# Patient Record
Sex: Male | Born: 1935 | Race: White | Hispanic: No | Marital: Married | Smoking: Former smoker
Health system: Southern US, Community
[De-identification: ages and names within clinical notes are randomized; demographics above are authoritative.]

## PROBLEM LIST (undated history)

## (undated) DIAGNOSIS — C649 Malignant neoplasm of unspecified kidney, except renal pelvis: Secondary | ICD-10-CM

## (undated) DIAGNOSIS — I10 Essential (primary) hypertension: Secondary | ICD-10-CM

## (undated) DIAGNOSIS — N186 End stage renal disease: Secondary | ICD-10-CM

## (undated) DIAGNOSIS — S82899A Other fracture of unspecified lower leg, initial encounter for closed fracture: Secondary | ICD-10-CM

## (undated) DIAGNOSIS — M1990 Unspecified osteoarthritis, unspecified site: Secondary | ICD-10-CM

## (undated) DIAGNOSIS — F329 Major depressive disorder, single episode, unspecified: Secondary | ICD-10-CM

## (undated) DIAGNOSIS — I340 Nonrheumatic mitral (valve) insufficiency: Secondary | ICD-10-CM

## (undated) DIAGNOSIS — I38 Endocarditis, valve unspecified: Secondary | ICD-10-CM

## (undated) DIAGNOSIS — G629 Polyneuropathy, unspecified: Secondary | ICD-10-CM

## (undated) DIAGNOSIS — F32A Depression, unspecified: Secondary | ICD-10-CM

## (undated) DIAGNOSIS — C439 Malignant melanoma of skin, unspecified: Secondary | ICD-10-CM

## (undated) DIAGNOSIS — E119 Type 2 diabetes mellitus without complications: Secondary | ICD-10-CM

## (undated) DIAGNOSIS — R51 Headache: Secondary | ICD-10-CM

## (undated) DIAGNOSIS — R21 Rash and other nonspecific skin eruption: Secondary | ICD-10-CM

## (undated) DIAGNOSIS — Z992 Dependence on renal dialysis: Secondary | ICD-10-CM

## (undated) HISTORY — DX: Nonrheumatic mitral (valve) insufficiency: I34.0

## (undated) HISTORY — DX: Malignant melanoma of skin, unspecified: C43.9

## (undated) HISTORY — DX: Essential (primary) hypertension: I10

## (undated) HISTORY — PX: HERNIA REPAIR: SHX51

## (undated) HISTORY — PX: COLONOSCOPY W/ BIOPSIES AND POLYPECTOMY: SHX1376

## (undated) HISTORY — DX: Polyneuropathy, unspecified: G62.9

## (undated) HISTORY — PX: FRACTURE SURGERY: SHX138

---

## 2009-03-26 ENCOUNTER — Ambulatory Visit: Payer: Self-pay | Admitting: Vascular Surgery

## 2009-04-08 ENCOUNTER — Ambulatory Visit (HOSPITAL_COMMUNITY): Admission: RE | Admit: 2009-04-08 | Discharge: 2009-04-08 | Payer: Self-pay | Admitting: Vascular Surgery

## 2009-04-08 ENCOUNTER — Ambulatory Visit: Payer: Self-pay | Admitting: Vascular Surgery

## 2009-05-14 ENCOUNTER — Ambulatory Visit: Payer: Self-pay | Admitting: Vascular Surgery

## 2010-10-06 LAB — GLUCOSE, CAPILLARY: Glucose-Capillary: 86 mg/dL (ref 70–99)

## 2010-10-06 LAB — POCT I-STAT 4, (NA,K, GLUC, HGB,HCT)
Glucose, Bld: 99 mg/dL (ref 70–99)
Potassium: 4 mEq/L (ref 3.5–5.1)

## 2010-11-15 NOTE — Procedures (Signed)
CEPHALIC VEIN MAPPING   INDICATION:  Preop evaluation.   HISTORY:  Chronic kidney disease.   EXAM:  The right cephalic vein is compressible with diameter measurements  ranging from 0.19 to 0.9 cm.   The left cephalic vein is compressible with diameter measurements  ranging from 0.26 to 0.53 cm.   See attached worksheet for all measurements.   IMPRESSION:  Patent bilateral cephalic veins with diameter measurements  as described above and on the attached worksheet.   ___________________________________________  Larina Earthly, M.D.   CH/MEDQ  D:  03/26/2009  T:  03/26/2009  Job:  [id number]

## 2010-11-15 NOTE — Consult Note (Signed)
NEW PATIENT CONSULTATION   [name], [name]  DOB:  09-21-1934                                       03/26/2009  CHART#:[id number]   The patient presents today for evaluation of AV access placement.  He is  a very pleasant 76 year old gentleman who had right nephrectomy for  renal cell cancer greater than 20 years ago.  He has had progressive  renal insufficiency over the past several years and is now approaching  the need for hemodialysis.  We are seeing him today for access  discussion.  He does have a history of non-insulin-dependent diabetes  for 4 years.  He is hypertensive.  He does not have a premature his 3 or  4 atherosclerotic disease and does not have any cardiac disease.   FAMILY HISTORY:  Negative for premature atherosclerotic disease.   SOCIAL HISTORY:  Is married with 2 children.  Quit smoking approximately  40 years ago.  Does not drink alcohol on a regular basis.  He is  retired.   REVIEW OF SYSTEMS:  Positive for weight loss.  Loss of appetite.  Chest  pain and shortness of breath with exertion.  Hiatal hernia.  Reflux.  Urinary frequency.  Chronic leg pain.  Headache.  Arthritic and muscle  pain.   ALLERGIES:  Sulfa causes a rash.  OxyContin causes light-headedness.   CURRENT MEDICATIONS:  Listed in his chart.   PHYSICAL EXAM:  Well-developed, well-nourished white male appearing  stated age of 78.  Blood pressure is 135/69, pulse 51, respirations 18.  His radial pulses are 2+ bilaterally.  He does have well-developed  cephalic vein in his wrists bilaterally.   He underwent vein mapping and this revealed the patency of the cephalic  veins bilaterally.  It is a good caliber of this at the level of his  wrist on the left, somewhat better than on the right.  He is right  handed.  I had a long discussion with the patient and I explained the  options of AV fistula, AV graft, and dialysis catheter for access.  I  recommended that we proceed with  AV fistula placement to his left wrist.  I explained that the longer this works the more mature the fistula would  be and would not accelerate the need for hemodialysis access.  We have  scheduled this at his convenience 10/07 as an outpatient at [facility].   Larina Earthly, M.D.  Electronically Signed   TFE/MEDQ  D:  03/26/2009  T:  03/26/2009  Job:  [id number]   cc:   Jorja Loa, M.D.  Wyvonnia Lora

## 2010-11-15 NOTE — Assessment & Plan Note (Signed)
OFFICE VISIT   [name] [name], [name] [name]  DOB:  [birth year]                                       05/14/2009  CHART#:[id number]   Patient presents today for follow-up of his left AV fistula creation on  04/08/09.  This was a left Cimino fistula.   He has excellent early maturation with very good size and very good flow  through his cephalic vein fistula.  He will continue his exercise of  this, and we will see him again on a p.r.n. basis   Larina Earthly, M.D.  Electronically Signed   TFE/MEDQ  D:  05/14/2009  T:  05/17/2009  Job:  [id number]   cc:   Jorja Loa, M.D.

## 2011-12-01 ENCOUNTER — Inpatient Hospital Stay (HOSPITAL_COMMUNITY)
Admission: RE | Admit: 2011-12-01 | Discharge: 2011-12-09 | DRG: 492 | Disposition: A | Discharge status: Home Health | Payer: Medicare PPO | Source: Other Acute Inpatient Hospital | Attending: Internal Medicine | Admitting: Internal Medicine

## 2011-12-01 ENCOUNTER — Encounter (HOSPITAL_COMMUNITY): Payer: Self-pay | Admitting: Orthopedic Surgery

## 2011-12-01 DIAGNOSIS — Z85528 Personal history of other malignant neoplasm of kidney: Secondary | ICD-10-CM

## 2011-12-01 DIAGNOSIS — M249 Joint derangement, unspecified: Secondary | ICD-10-CM | POA: Diagnosis present

## 2011-12-01 DIAGNOSIS — R1310 Dysphagia, unspecified: Secondary | ICD-10-CM | POA: Diagnosis not present

## 2011-12-01 DIAGNOSIS — M810 Age-related osteoporosis without current pathological fracture: Secondary | ICD-10-CM | POA: Diagnosis present

## 2011-12-01 DIAGNOSIS — E1149 Type 2 diabetes mellitus with other diabetic neurological complication: Secondary | ICD-10-CM | POA: Diagnosis present

## 2011-12-01 DIAGNOSIS — S82409A Unspecified fracture of shaft of unspecified fibula, initial encounter for closed fracture: Secondary | ICD-10-CM | POA: Diagnosis present

## 2011-12-01 DIAGNOSIS — Z992 Dependence on renal dialysis: Secondary | ICD-10-CM

## 2011-12-01 DIAGNOSIS — N186 End stage renal disease: Secondary | ICD-10-CM | POA: Diagnosis present

## 2011-12-01 DIAGNOSIS — Z905 Acquired absence of kidney: Secondary | ICD-10-CM

## 2011-12-01 DIAGNOSIS — R339 Retention of urine, unspecified: Secondary | ICD-10-CM | POA: Diagnosis not present

## 2011-12-01 DIAGNOSIS — I2510 Atherosclerotic heart disease of native coronary artery without angina pectoris: Secondary | ICD-10-CM | POA: Diagnosis present

## 2011-12-01 DIAGNOSIS — T84498A Other mechanical complication of other internal orthopedic devices, implants and grafts, initial encounter: Secondary | ICD-10-CM | POA: Diagnosis present

## 2011-12-01 DIAGNOSIS — G629 Polyneuropathy, unspecified: Secondary | ICD-10-CM | POA: Diagnosis present

## 2011-12-01 DIAGNOSIS — M24176 Other articular cartilage disorders, unspecified foot: Secondary | ICD-10-CM | POA: Diagnosis present

## 2011-12-01 DIAGNOSIS — K219 Gastro-esophageal reflux disease without esophagitis: Secondary | ICD-10-CM | POA: Diagnosis present

## 2011-12-01 DIAGNOSIS — E1142 Type 2 diabetes mellitus with diabetic polyneuropathy: Secondary | ICD-10-CM | POA: Diagnosis present

## 2011-12-01 DIAGNOSIS — Y9301 Activity, walking, marching and hiking: Secondary | ICD-10-CM

## 2011-12-01 DIAGNOSIS — I120 Hypertensive chronic kidney disease with stage 5 chronic kidney disease or end stage renal disease: Secondary | ICD-10-CM | POA: Diagnosis present

## 2011-12-01 DIAGNOSIS — D638 Anemia in other chronic diseases classified elsewhere: Secondary | ICD-10-CM | POA: Diagnosis present

## 2011-12-01 DIAGNOSIS — S82899A Other fracture of unspecified lower leg, initial encounter for closed fracture: Principal | ICD-10-CM | POA: Diagnosis present

## 2011-12-01 DIAGNOSIS — Z79899 Other long term (current) drug therapy: Secondary | ICD-10-CM

## 2011-12-01 DIAGNOSIS — E871 Hypo-osmolality and hyponatremia: Secondary | ICD-10-CM | POA: Diagnosis not present

## 2011-12-01 DIAGNOSIS — S93439A Sprain of tibiofibular ligament of unspecified ankle, initial encounter: Secondary | ICD-10-CM

## 2011-12-01 DIAGNOSIS — E119 Type 2 diabetes mellitus without complications: Secondary | ICD-10-CM | POA: Diagnosis present

## 2011-12-01 DIAGNOSIS — S82872A Displaced pilon fracture of left tibia, initial encounter for closed fracture: Secondary | ICD-10-CM

## 2011-12-01 DIAGNOSIS — B370 Candidal stomatitis: Secondary | ICD-10-CM

## 2011-12-01 DIAGNOSIS — S82209A Unspecified fracture of shaft of unspecified tibia, initial encounter for closed fracture: Secondary | ICD-10-CM | POA: Diagnosis present

## 2011-12-01 DIAGNOSIS — Y831 Surgical operation with implant of artificial internal device as the cause of abnormal reaction of the patient, or of later complication, without mention of misadventure at the time of the procedure: Secondary | ICD-10-CM | POA: Diagnosis present

## 2011-12-01 DIAGNOSIS — N2581 Secondary hyperparathyroidism of renal origin: Secondary | ICD-10-CM | POA: Diagnosis present

## 2011-12-01 DIAGNOSIS — X58XXXA Exposure to other specified factors, initial encounter: Secondary | ICD-10-CM | POA: Diagnosis present

## 2011-12-01 HISTORY — DX: Other fracture of unspecified lower leg, initial encounter for closed fracture: S82.899A

## 2011-12-01 HISTORY — DX: Malignant neoplasm of unspecified kidney, except renal pelvis: C64.9

## 2011-12-01 HISTORY — DX: Type 2 diabetes mellitus without complications: E11.9

## 2011-12-01 HISTORY — DX: End stage renal disease: N18.6

## 2011-12-01 HISTORY — DX: Dependence on renal dialysis: Z99.2

## 2011-12-01 NOTE — Consult Note (Signed)
Reason for Consult:   Left ankle fracture and infection Referring Physician:    EDP  [name] [name] is an 77 y.o. male  Retired from [employer] work.  He is a patient of Dr Chaney Malling up in [place] and suffered a Massineuve fracture almost three months back.  Underwent reduction and placement of two syndesmotic screws about three months ago by Dr Chaney Malling.  Remained NWB for 6 weeks and gradually returned to walking.  Was told they might or might not remove the screws.  Unfortunately has been having pain a swelling for a couple of weeks and today experienced crack and sharp pain while walking with a cane to store.  Apparently Dr Chaney Malling is off and there is no one on call for him so [name] is transferred to [facility] for management of this complication.  He denies pain elsewhere and has some significant medical issues including ESRD and has been admitted to medical service..    No past medical history on file.  No past surgical history on file.  No family history on file.  Social History:  does not have a smoking history on file. He does not have any smokeless tobacco history on file. His alcohol and drug histories not on file.  Allergies:  Allergies  Allergen Reactions  . Ivp Dye (Iodinated Diagnostic Agents)     Unknown  . Ofloxacin Nausea And Vomiting  . Oxycodone     Altered mental status  . Sulfa Antibiotics Rash  . Sulfamethoxazole W-Trimethoprim Rash    Medications: I have reviewed the patient's current medications.  No results found for this or any previous visit (from the past 48 hour(s)).  No results found.  @ROS @ Blood pressure 109/66, pulse 65, temperature 99.4 F (37.4 C), resp. rate 16, SpO2 94.00%.  PHYSICAL EXAM:   ABD soft Intact pulses distally Dorsiflexion/Plantar flexion intact Compartment soft  Skin is intact, moderate deformity, decreased sensation on both sides  XRAY:  Reviewed plain films and CT from today done up in [place].  Has plafond fractures mod angulated and  displaced and also fibula fracture min disp.  Lucencies around screws worrisome for infection.  ASSESSMENT:   Left ankle fractures and possible infection  PLAN:   [name] is in severe pain and has an acute on chronic situation at his ankle.  I have concerns that he has an infection and fractures.  He is to a degree immunocompromised with his renal situation and borderline diabetes.  I think he would best be served with removal of the loose screws, I&D, and placement of a spanning XFIX.  We will culture in OR and consequently hold off on antibiotics preop.  We would then treat with IV antibiotics depending on intraoop findings and he would have definitive procedure in a week or two possibly by Dr Carola Frost.  Planning surgery for early afternoon tomorrow (Sat) so hoping for dialysis in morning if OK with medical team.   Kieara Schwark G 12/01/2011, 11:47 PM

## 2011-12-02 ENCOUNTER — Encounter (HOSPITAL_COMMUNITY): Payer: Self-pay | Admitting: Certified Registered"

## 2011-12-02 ENCOUNTER — Encounter (HOSPITAL_COMMUNITY): Payer: Self-pay | Admitting: Internal Medicine

## 2011-12-02 ENCOUNTER — Inpatient Hospital Stay (HOSPITAL_COMMUNITY): Payer: Medicare PPO

## 2011-12-02 ENCOUNTER — Encounter (HOSPITAL_COMMUNITY)
Admission: RE | Disposition: A | Discharge status: Home Health | Payer: Self-pay | Source: Other Acute Inpatient Hospital | Attending: Internal Medicine

## 2011-12-02 ENCOUNTER — Inpatient Hospital Stay (HOSPITAL_COMMUNITY): Payer: Medicare PPO | Admitting: Certified Registered"

## 2011-12-02 DIAGNOSIS — S82209A Unspecified fracture of shaft of unspecified tibia, initial encounter for closed fracture: Secondary | ICD-10-CM

## 2011-12-02 DIAGNOSIS — S82409A Unspecified fracture of shaft of unspecified fibula, initial encounter for closed fracture: Secondary | ICD-10-CM | POA: Diagnosis present

## 2011-12-02 DIAGNOSIS — E119 Type 2 diabetes mellitus without complications: Secondary | ICD-10-CM | POA: Diagnosis present

## 2011-12-02 DIAGNOSIS — S82899A Other fracture of unspecified lower leg, initial encounter for closed fracture: Secondary | ICD-10-CM | POA: Diagnosis present

## 2011-12-02 DIAGNOSIS — N186 End stage renal disease: Secondary | ICD-10-CM

## 2011-12-02 DIAGNOSIS — I10 Essential (primary) hypertension: Secondary | ICD-10-CM

## 2011-12-02 HISTORY — PX: HARDWARE REMOVAL: SHX979

## 2011-12-02 HISTORY — PX: I & D EXTREMITY: SHX5045

## 2011-12-02 HISTORY — PX: EXTERNAL FIXATION LEG: SHX1549

## 2011-12-02 LAB — CBC
HCT: 33.6 % — ABNORMAL LOW (ref 39.0–52.0)
MCV: 98.8 fL (ref 78.0–100.0)
RBC: 3.4 MIL/uL — ABNORMAL LOW (ref 4.22–5.81)
RDW: 14 % (ref 11.5–15.5)
WBC: 8.8 10*3/uL (ref 4.0–10.5)

## 2011-12-02 LAB — BASIC METABOLIC PANEL
CO2: 27 mEq/L (ref 19–32)
Chloride: 94 mEq/L — ABNORMAL LOW (ref 96–112)
Creatinine, Ser: 7.56 mg/dL — ABNORMAL HIGH (ref 0.50–1.35)

## 2011-12-02 SURGERY — EXTERNAL FIXATION, LOWER EXTREMITY
Anesthesia: General | Site: Leg Lower | Laterality: Left | Wound class: Clean Contaminated

## 2011-12-02 MED ORDER — ROCURONIUM BROMIDE 100 MG/10ML IV SOLN
INTRAVENOUS | Status: DC | PRN
  Administered 2011-12-02: 40 mg via INTRAVENOUS

## 2011-12-02 MED ORDER — SENNA 8.6 MG PO TABS
1.0000 | ORAL_TABLET | Freq: Two times a day (BID) | ORAL | Status: DC
  Administered 2011-12-02 – 2011-12-09 (×14): 8.6 mg via ORAL
  Filled 2011-12-02 (×17): qty 1

## 2011-12-02 MED ORDER — FENTANYL CITRATE 0.05 MG/ML IJ SOLN
25.0000 ug | INTRAMUSCULAR | Status: DC | PRN
  Administered 2011-12-07 (×3): 25 ug via INTRAVENOUS

## 2011-12-02 MED ORDER — POLYETHYLENE GLYCOL 3350 17 G PO PACK
17.0000 g | PACK | Freq: Every day | ORAL | Status: DC | PRN
  Filled 2011-12-02: qty 1

## 2011-12-02 MED ORDER — CEFAZOLIN SODIUM 1-5 GM-% IV SOLN
1.0000 g | Freq: Three times a day (TID) | INTRAVENOUS | Status: DC
  Filled 2011-12-02 (×2): qty 50

## 2011-12-02 MED ORDER — SODIUM CHLORIDE 0.9 % IJ SOLN: 3.0000 mL | INTRAMUSCULAR | Status: DC | PRN

## 2011-12-02 MED ORDER — MORPHINE SULFATE 2 MG/ML IJ SOLN
INTRAMUSCULAR | Status: AC
  Administered 2011-12-02: 2 mg via INTRAVENOUS
  Filled 2011-12-02: qty 1

## 2011-12-02 MED ORDER — LIDOCAINE HCL (PF) 1 % IJ SOLN: 5.0000 mL | INTRAMUSCULAR | Status: DC | PRN

## 2011-12-02 MED ORDER — DEXTROSE 5 % IV SOLN
INTRAVENOUS | Status: DC | PRN
  Administered 2011-12-02: 16:00:00 via INTRAVENOUS

## 2011-12-02 MED ORDER — GLYCOPYRROLATE 0.2 MG/ML IJ SOLN
INTRAMUSCULAR | Status: DC | PRN
  Administered 2011-12-02: 0.6 mg via INTRAVENOUS

## 2011-12-02 MED ORDER — ACETAMINOPHEN 325 MG PO TABS: 650.0000 mg | ORAL_TABLET | Freq: Four times a day (QID) | ORAL | Status: DC | PRN

## 2011-12-02 MED ORDER — SODIUM CHLORIDE 0.9 % IV SOLN
INTRAVENOUS | Status: DC | PRN
  Administered 2011-12-02: 16:00:00 via INTRAVENOUS

## 2011-12-02 MED ORDER — DEXAMETHASONE SODIUM PHOSPHATE 4 MG/ML IJ SOLN
INTRAMUSCULAR | Status: DC | PRN
  Administered 2011-12-02: 4 mg via INTRAVENOUS

## 2011-12-02 MED ORDER — ACETAMINOPHEN 650 MG RE SUPP: 650.0000 mg | Freq: Four times a day (QID) | RECTAL | Status: DC | PRN

## 2011-12-02 MED ORDER — DROPERIDOL 2.5 MG/ML IJ SOLN: INTRAMUSCULAR | Status: DC | PRN

## 2011-12-02 MED ORDER — METOPROLOL TARTRATE 25 MG PO TABS
25.0000 mg | ORAL_TABLET | Freq: Every day | ORAL | Status: DC
  Administered 2011-12-03 – 2011-12-09 (×7): 25 mg via ORAL
  Filled 2011-12-02 (×8): qty 1

## 2011-12-02 MED ORDER — CEFAZOLIN SODIUM 1-5 GM-% IV SOLN
INTRAVENOUS | Status: AC
  Filled 2011-12-02: qty 100

## 2011-12-02 MED ORDER — NEPRO/CARBSTEADY PO LIQD
237.0000 mL | ORAL | Status: DC | PRN
  Administered 2011-12-06: 237 mL via ORAL
  Filled 2011-12-02: qty 237

## 2011-12-02 MED ORDER — LIDOCAINE HCL (CARDIAC) 20 MG/ML IV SOLN
INTRAVENOUS | Status: DC | PRN
  Administered 2011-12-02: 80 mg via INTRAVENOUS

## 2011-12-02 MED ORDER — SODIUM CHLORIDE 0.9 % IV SOLN: 100.0000 mL | INTRAVENOUS | Status: DC | PRN

## 2011-12-02 MED ORDER — CEFAZOLIN SODIUM 1-5 GM-% IV SOLN
INTRAVENOUS | Status: DC | PRN
  Administered 2011-12-02: 2 g via INTRAVENOUS

## 2011-12-02 MED ORDER — DOCUSATE SODIUM 100 MG PO CAPS
100.0000 mg | ORAL_CAPSULE | Freq: Two times a day (BID) | ORAL | Status: DC
  Administered 2011-12-02 – 2011-12-09 (×15): 100 mg via ORAL
  Filled 2011-12-02 (×16): qty 1

## 2011-12-02 MED ORDER — SODIUM CHLORIDE 0.9 % IV SOLN
250.0000 mL | INTRAVENOUS | Status: DC | PRN
  Administered 2011-12-07 (×2): via INTRAVENOUS

## 2011-12-02 MED ORDER — METOCLOPRAMIDE HCL 10 MG PO TABS: 5.0000 mg | ORAL_TABLET | Freq: Three times a day (TID) | ORAL | Status: DC | PRN

## 2011-12-02 MED ORDER — RENA-VITE PO TABS
1.0000 | ORAL_TABLET | Freq: Every day | ORAL | Status: DC
  Administered 2011-12-03 – 2011-12-08 (×6): 1 via ORAL
  Filled 2011-12-02 (×8): qty 1

## 2011-12-02 MED ORDER — EPINEPHRINE HCL 0.1 MG/ML IJ SOLN: INTRAMUSCULAR | Status: DC | PRN

## 2011-12-02 MED ORDER — ALLOPURINOL 100 MG PO TABS
200.0000 mg | ORAL_TABLET | Freq: Every day | ORAL | Status: DC
  Filled 2011-12-02: qty 2

## 2011-12-02 MED ORDER — PROPOFOL 10 MG/ML IV EMUL
INTRAVENOUS | Status: DC | PRN
  Administered 2011-12-02: 160 mg via INTRAVENOUS

## 2011-12-02 MED ORDER — HEPARIN SODIUM (PORCINE) 1000 UNIT/ML DIALYSIS
1000.0000 [IU] | INTRAMUSCULAR | Status: DC | PRN
  Filled 2011-12-02: qty 1

## 2011-12-02 MED ORDER — MORPHINE SULFATE 2 MG/ML IJ SOLN
2.0000 mg | INTRAMUSCULAR | Status: DC | PRN
  Administered 2011-12-02 (×3): 2 mg via INTRAVENOUS
  Filled 2011-12-02 (×2): qty 1

## 2011-12-02 MED ORDER — ONDANSETRON HCL 4 MG/2ML IJ SOLN: 4.0000 mg | Freq: Four times a day (QID) | INTRAMUSCULAR | Status: DC | PRN

## 2011-12-02 MED ORDER — PENTAFLUOROPROP-TETRAFLUOROETH EX AERO: 1.0000 "application " | INHALATION_SPRAY | CUTANEOUS | Status: DC | PRN

## 2011-12-02 MED ORDER — ONDANSETRON HCL 4 MG/2ML IJ SOLN
INTRAMUSCULAR | Status: DC | PRN
  Administered 2011-12-02: 4 mg via INTRAVENOUS

## 2011-12-02 MED ORDER — SEVELAMER HCL 800 MG PO TABS
1600.0000 mg | ORAL_TABLET | Freq: Three times a day (TID) | ORAL | Status: DC
  Administered 2011-12-03 – 2011-12-09 (×14): 1600 mg via ORAL
  Filled 2011-12-02 (×25): qty 2

## 2011-12-02 MED ORDER — PAROXETINE HCL 30 MG PO TABS
30.0000 mg | ORAL_TABLET | Freq: Every day | ORAL | Status: DC
  Administered 2011-12-02 – 2011-12-09 (×8): 30 mg via ORAL
  Filled 2011-12-02 (×8): qty 1

## 2011-12-02 MED ORDER — HYDROCODONE-ACETAMINOPHEN 5-325 MG PO TABS
1.0000 | ORAL_TABLET | Freq: Four times a day (QID) | ORAL | Status: DC | PRN
  Administered 2011-12-02 – 2011-12-04 (×6): 1 via ORAL
  Administered 2011-12-05: 2 via ORAL
  Administered 2011-12-05: 1 via ORAL
  Administered 2011-12-06 – 2011-12-09 (×6): 2 via ORAL
  Filled 2011-12-02: qty 1
  Filled 2011-12-02: qty 2
  Filled 2011-12-02: qty 1
  Filled 2011-12-02 (×2): qty 2
  Filled 2011-12-02: qty 1
  Filled 2011-12-02: qty 2
  Filled 2011-12-02: qty 1
  Filled 2011-12-02 (×2): qty 2
  Filled 2011-12-02 (×4): qty 1

## 2011-12-02 MED ORDER — FENTANYL CITRATE 0.05 MG/ML IJ SOLN
INTRAMUSCULAR | Status: DC | PRN
  Administered 2011-12-02: 50 ug via INTRAVENOUS
  Administered 2011-12-02: 100 ug via INTRAVENOUS
  Administered 2011-12-02 (×3): 50 ug via INTRAVENOUS

## 2011-12-02 MED ORDER — ONDANSETRON HCL 4 MG PO TABS: 4.0000 mg | ORAL_TABLET | Freq: Four times a day (QID) | ORAL | Status: DC | PRN

## 2011-12-02 MED ORDER — HEPARIN SODIUM (PORCINE) 5000 UNIT/ML IJ SOLN
5000.0000 [IU] | Freq: Three times a day (TID) | INTRAMUSCULAR | Status: DC
  Administered 2011-12-02 – 2011-12-06 (×13): 5000 [IU] via SUBCUTANEOUS
  Filled 2011-12-02 (×17): qty 1

## 2011-12-02 MED ORDER — PANTOPRAZOLE SODIUM 20 MG PO TBEC
20.0000 mg | DELAYED_RELEASE_TABLET | Freq: Every day | ORAL | Status: DC
  Administered 2011-12-03: 20 mg via ORAL
  Filled 2011-12-02 (×2): qty 1

## 2011-12-02 MED ORDER — MIDAZOLAM HCL 5 MG/5ML IJ SOLN
INTRAMUSCULAR | Status: DC | PRN
  Administered 2011-12-02: 1.5 mg via INTRAVENOUS

## 2011-12-02 MED ORDER — LIDOCAINE-PRILOCAINE 2.5-2.5 % EX CREA
1.0000 "application " | TOPICAL_CREAM | CUTANEOUS | Status: DC | PRN
  Filled 2011-12-02: qty 5

## 2011-12-02 MED ORDER — SODIUM CHLORIDE 0.9 % IR SOLN
Status: DC | PRN
  Administered 2011-12-02: 1

## 2011-12-02 MED ORDER — SODIUM CHLORIDE 0.9 % IJ SOLN
3.0000 mL | Freq: Two times a day (BID) | INTRAMUSCULAR | Status: DC
  Administered 2011-12-02: 3 mL via INTRAVENOUS

## 2011-12-02 MED ORDER — METOCLOPRAMIDE HCL 5 MG/ML IJ SOLN: 5.0000 mg | Freq: Three times a day (TID) | INTRAMUSCULAR | Status: DC | PRN

## 2011-12-02 MED ORDER — EPHEDRINE SULFATE 50 MG/ML IJ SOLN
INTRAMUSCULAR | Status: DC | PRN
  Administered 2011-12-02: 10 mg via INTRAVENOUS

## 2011-12-02 MED ORDER — CEFAZOLIN SODIUM 1-5 GM-% IV SOLN
1.0000 g | INTRAVENOUS | Status: DC
  Administered 2011-12-03 – 2011-12-05 (×3): 1 g via INTRAVENOUS
  Filled 2011-12-02 (×3): qty 50

## 2011-12-02 MED ORDER — CALCIUM ACETATE 667 MG PO CAPS
1334.0000 mg | ORAL_CAPSULE | Freq: Three times a day (TID) | ORAL | Status: DC
  Filled 2011-12-02 (×4): qty 2

## 2011-12-02 MED ORDER — NEOSTIGMINE METHYLSULFATE 1 MG/ML IJ SOLN
INTRAMUSCULAR | Status: DC | PRN
  Administered 2011-12-02: 5 mg via INTRAVENOUS

## 2011-12-02 MED ORDER — ALTEPLASE 2 MG IJ SOLR
2.0000 mg | Freq: Once | INTRAMUSCULAR | Status: DC | PRN
  Filled 2011-12-02: qty 2

## 2011-12-02 MED ORDER — SODIUM CHLORIDE 0.9 % IV SOLN
10.0000 mg | INTRAVENOUS | Status: DC | PRN
  Administered 2011-12-02: 15 ug/min via INTRAVENOUS

## 2011-12-02 MED ORDER — HETASTARCH-ELECTROLYTES 6 % IV SOLN
INTRAVENOUS | Status: DC | PRN
  Administered 2011-12-02: 16:00:00 via INTRAVENOUS

## 2011-12-02 SURGICAL SUPPLY — 52 items
BANDAGE ELASTIC 4 VELCRO ST LF (GAUZE/BANDAGES/DRESSINGS) ×3 IMPLANT
BANDAGE ELASTIC 6 VELCRO ST LF (GAUZE/BANDAGES/DRESSINGS) ×3 IMPLANT
BANDAGE GAUZE ELAST BULKY 4 IN (GAUZE/BANDAGES/DRESSINGS) ×3 IMPLANT
BIT DRILL 3.2X240 A/O LONG (BIT) ×3 IMPLANT
BLADE SURG 10 STRL SS (BLADE) ×3 IMPLANT
BNDG COHESIVE 4X5 TAN STRL (GAUZE/BANDAGES/DRESSINGS) IMPLANT
CLAMP 2 3/5OST (Clamp) ×6 IMPLANT
CLAMP 5 HOLE (Clamp) ×3 IMPLANT
CLAMP PIN-ROD (Clamp) ×3 IMPLANT
CLAMP ROD-ROD (Clamp) ×6 IMPLANT
CLOTH BEACON ORANGE TIMEOUT ST (SAFETY) ×3 IMPLANT
COVER SURGICAL LIGHT HANDLE (MISCELLANEOUS) ×3 IMPLANT
CUFF TOURNIQUET SINGLE 34IN LL (TOURNIQUET CUFF) IMPLANT
CUFF TOURNIQUET SINGLE 44IN (TOURNIQUET CUFF) IMPLANT
DRAPE C-ARMOR (DRAPES) ×3 IMPLANT
DURAPREP 26ML APPLICATOR (WOUND CARE) ×3 IMPLANT
ELECT REM PT RETURN 9FT ADLT (ELECTROSURGICAL) ×3
ELECTRODE REM PT RTRN 9FT ADLT (ELECTROSURGICAL) ×2 IMPLANT
EVACUATOR 1/8 PVC DRAIN (DRAIN) IMPLANT
FACESHIELD LNG OPTICON STERILE (SAFETY) IMPLANT
GAUZE XEROFORM 1X8 LF (GAUZE/BANDAGES/DRESSINGS) IMPLANT
GAUZE XEROFORM 5X9 LF (GAUZE/BANDAGES/DRESSINGS) ×3 IMPLANT
GLOVE BIO SURGEON STRL SZ8.5 (GLOVE) ×3 IMPLANT
GLOVE BIOGEL PI IND STRL 8.5 (GLOVE) ×2 IMPLANT
GLOVE BIOGEL PI INDICATOR 8.5 (GLOVE) ×1
GLOVE SS BIOGEL STRL SZ 8 (GLOVE) ×2 IMPLANT
GLOVE SUPERSENSE BIOGEL SZ 8 (GLOVE) ×1
GOWN PREVENTION PLUS XXLARGE (GOWN DISPOSABLE) ×6 IMPLANT
GOWN STRL NON-REIN LRG LVL3 (GOWN DISPOSABLE) ×6 IMPLANT
HANDPIECE INTERPULSE COAX TIP (DISPOSABLE)
KIT BASIN OR (CUSTOM PROCEDURE TRAY) ×3 IMPLANT
KIT ROOM TURNOVER OR (KITS) ×3 IMPLANT
MANIFOLD NEPTUNE II (INSTRUMENTS) ×3 IMPLANT
NS IRRIG 1000ML POUR BTL (IV SOLUTION) ×3 IMPLANT
PACK ORTHO EXTREMITY (CUSTOM PROCEDURE TRAY) ×3 IMPLANT
PAD ARMBOARD 7.5X6 YLW CONV (MISCELLANEOUS) ×6 IMPLANT
PENCIL BUTTON HOLSTER BLD 10FT (ELECTRODE) IMPLANT
ROD CARBON FIBER 300X9.5MM (Rod) ×6 IMPLANT
SCREW BONE SELF DRILL/TAP5X150 (Screw) ×6 IMPLANT
SCREW TRANSFIXING 6X350MM (Screw) ×3 IMPLANT
SET HNDPC FAN SPRY TIP SCT (DISPOSABLE) IMPLANT
SPONGE GAUZE 4X4 12PLY (GAUZE/BANDAGES/DRESSINGS) ×3 IMPLANT
SPONGE LAP 18X18 X RAY DECT (DISPOSABLE) ×3 IMPLANT
STOCKINETTE IMPERVIOUS 9X36 MD (GAUZE/BANDAGES/DRESSINGS) IMPLANT
SUT ETHILON 3 0 PS 1 (SUTURE) ×3 IMPLANT
TOWEL OR 17X24 6PK STRL BLUE (TOWEL DISPOSABLE) ×3 IMPLANT
TOWEL OR 17X26 10 PK STRL BLUE (TOWEL DISPOSABLE) ×3 IMPLANT
TUBE ANAEROBIC SPECIMEN COL (MISCELLANEOUS) ×3 IMPLANT
TUBE CONNECTING 12X1/4 (SUCTIONS) ×3 IMPLANT
UNDERPAD 30X30 INCONTINENT (UNDERPADS AND DIAPERS) ×3 IMPLANT
WATER STERILE IRR 1000ML POUR (IV SOLUTION) ×3 IMPLANT
YANKAUER SUCT BULB TIP NO VENT (SUCTIONS) ×3 IMPLANT

## 2011-12-02 NOTE — OR Nursing (Signed)
Two screws and washers removed from ankle per Dr. Reesa Chew.

## 2011-12-02 NOTE — H&P (Signed)
PCP:  Wyvonnia Lora, MD Dr. Kristian Covey, nephrology   Chief Complaint:  Left ankle pop   HPI: [phone] with h/o remote renal cell ca s/p right nephrectomy, now ESRD  on HD (T/T/S), borderline DM, HTN, no known or reported cardiac  disease or strokes, and recent left ankle fracture s/p screw  placement ~09/2011, now presents with distal tib/fib fracture and  loosened ankle hardware.    Pt was walking to the drug store today when he felt an acute pop in  his left ankle with pain. He was able to make it to drug store,  then got a ride home, where his wife made him come to the hospital.  At [facility], plain film then CT ankle showed lucency around the  screws concering for infxn or loosened hardware, and fractures of  both distal tib and fib. Spoke to Ortho here at [facility], and  transferred. Medicine admit due to ESRD, medical issues. Ortho has  seen, plan OR tomorrow.   Vitals have been stable. Labs with WBC 10.7 otherwise unremarkable.   ROS as above otherwise stable, negative. Pt does not seem very  active, has limited exertion, but no apparent cardiac limitations,  no angina with exertion, although he'll be winded if he really  pushes himself. He denies any cardiac history at all, no strokes,  no DM.    Past Medical History  Diagnosis Date  . Renal cell cancer     Remote. S/p right nephrectomy.   . ESRD on dialysis     Left fistula   . Ankle fracture     Left ~08/2011. Massineuve fracture  . DM (diabetes mellitus)     Per pt, borderline     No past surgical history on file.  Medications:  HOME MEDS: Prior to Admission medications   Medication Sig Start Date End Date Taking? Authorizing Provider  acetaminophen (TYLENOL) 325 MG tablet Take 650 mg by mouth every 6 (six) hours as needed. For pain   Yes Historical Provider, MD  allopurinol (ZYLOPRIM) 100 MG tablet Take 200 mg by mouth daily.   Yes Historical Provider, MD  calcium acetate (PHOSLO) 667 MG capsule Take 1,334 mg by  mouth 3 (three) times daily with meals.   Yes Historical Provider, MD  gabapentin (NEURONTIN) 300 MG capsule Take 300 mg by mouth at bedtime as needed. For pain   Yes Historical Provider, MD  lansoprazole (PREVACID) 15 MG capsule Take 15 mg by mouth daily.   Yes Historical Provider, MD  metoprolol (LOPRESSOR) 50 MG tablet Take 25 mg by mouth daily.   Yes Historical Provider, MD  multivitamin (RENA-VIT) TABS tablet Take 1 tablet by mouth daily.   Yes Historical Provider, MD  PARoxetine (PAXIL) 30 MG tablet Take 30 mg by mouth every morning.   Yes Historical Provider, MD  sevelamer (RENAGEL) 800 MG tablet Take 1,600 mg by mouth 3 (three) times daily with meals.   Yes Historical Provider, MD    Allergies:  Allergies  Allergen Reactions  . Ivp Dye (Iodinated Diagnostic Agents)     Unknown  . Ofloxacin Nausea And Vomiting  . Oxycodone     Altered mental status  . Sulfa Antibiotics Rash  . Sulfamethoxazole W-Trimethoprim Rash    Social History:   does not have a smoking history on file. He does not have any smokeless tobacco history on file. His alcohol and drug histories not on file. He lives at home with his wife and has children. He does not smoke and  denies drugs, alcohol. He is not very active at baseline but is still able to walk with a cane.   Family History: Not contributory   Physical Exam: Filed Vitals:   12/01/11 2222  BP: 109/66  Pulse: 65  Temp: 99.4 F (37.4 C)  Resp: 16  SpO2: 94%   Blood pressure 109/66, pulse 65, temperature 99.4 F (37.4 C), resp. rate 16, SpO2 94.00%. Gen: Elderly M in no distress, pleasant, rambling a bit, but  overall stable appearing HEENT: Pupils round, equal, EOMI, sclera clear. Mouth/tongue a bit  dry appearing Lungs: CTAB no w/c/r, good air movement, normal exam, no cough,  breathing comfortably Heart: Regular, S1/2 soft and faint but audible, no m/g heard.  Abd: Soft, minimally distended, but not tender, tight, no  grimacing,  benign Extrem: Warm, perfusing well, radials a little hard to palpate  though. No BLE edema noted, but RLE with a lot of excoriations  noted. LLE wrapped in cast/gauze. LUE with fistula noted with  thrill, large ecchymosis noted near elbow.  Neuro: Alert, attnetive, conversant, pleasant CN 2-12 intact,  speech clear and fluent, moves extremities spontaneously on his  own, grossly nonfocal.     Labs & Imaging From [facility]  WBC 10.7 Hcgt 12.6 PLts 161 INR 1.0  136   96   37 ----------------< 107 4.2   30   7.11  Ca 8.5 Tbili 1.1 AST 22 ALT 13  Impression Present on Admission:  .Tibia fracture .Fibula fracture .DM (diabetes mellitus) .ESRD on dialysis .Ankle fracture  76yoM with h/o remote renal cell ca s/p right nephrectomy, now ESRD  on HD (T/T/S), borderline DM, HTN, no known or reported cardiac  disease or strokes, and recent left ankle fracture s/p screw  placement ~09/2011, now presents with distal tib/fib fracture and  loosened ankle hardware.   1. Ankle fracture: Per ortho. Per the [facility] Revised Cardiac  Index, he only has one high risk feature for perioperative  complications, Cr > 2.0. He has no known cardiac history. Although  he is not the most active person, he doesn't seem limited in his  exertional capacity by cardiopulmonary symptoms, and I do not think  that any further cardiac testing or risk stratification is  warranted for this intermediate risk surgery.   - ECG, type and screen with am labs, INR was 1.0 at [facility]. CXR.  - Bedrest, NPO, PT consult   2. ESRD on HD: Due to be dialyzed tomorrow, have left message for  renal to this effect. ? dialysis in the am and OR in the pm.  - Continue phoslo, sevelamer, renavit  3. HTN: Cotninue home lopressor 4. Continue home paxil, allopurinol. Hold neurontin  SubQ heparin Regular bed, [facility] team 4 Presumed full code  Other plans as per orders.  [name] [name] 12/02/2011, 1:01 AM

## 2011-12-02 NOTE — Preoperative (Signed)
Beta Blockers   Reason not to administer Beta Blockers:Not Applicable 

## 2011-12-02 NOTE — Interval H&P Note (Signed)
History and Physical Interval Note:  12/02/2011 3:34 PM  [name] [name]  has presented today for surgery, with the diagnosis of Left Ankle Fracture and Infection  The various methods of treatment have been discussed with the patient and family. After consideration of risks, benefits and other options for treatment, the patient has consented to  Procedure(s) (LRB): EXTERNAL FIXATION LEG (Left) IRRIGATION AND DEBRIDEMENT EXTREMITY (Left) as a surgical intervention .  The patients' history has been reviewed, patient examined, no change in status, stable for surgery.  I have reviewed the patients' chart and labs.  Questions were answered to the patient's satisfaction.     [name]

## 2011-12-02 NOTE — Brief Op Note (Signed)
[name] [name] [id number] 12/02/2011   PRE-OP DIAGNOSIS: left ankle fracture and infection   POST-OP DIAGNOSIS: same  PROCEDURE: left ankle removal hwre, I&D, and Xfix  ANESTHESIA: general  [name] [name] G   Dictation #:  [id number]

## 2011-12-02 NOTE — Anesthesia Preprocedure Evaluation (Addendum)
Anesthesia Evaluation  Patient identified by MRN, date of birth, ID band Patient awake    Reviewed: Allergy & Precautions, H&P , NPO status , Patient's Chart, lab work & pertinent test results  Airway Mallampati: II      Dental   Pulmonary neg pulmonary ROS,  breath sounds clear to auscultation        Cardiovascular hypertension, + CAD Rhythm:Regular Rate:Normal     Neuro/Psych    GI/Hepatic negative GI ROS, Neg liver ROS,   Endo/Other  Diabetes mellitus-, Type 2  Renal/GU History of  Renal cell cancer.     Musculoskeletal   Abdominal   Peds  Hematology negative hematology ROS (+)   Anesthesia Other Findings   Reproductive/Obstetrics                        Anesthesia Physical Anesthesia Plan  ASA: III  Anesthesia Plan: General   Post-op Pain Management:    Induction: Intravenous  Airway Management Planned: Oral ETT  Additional Equipment:   Intra-op Plan:   Post-operative Plan: Extubation in OR  Informed Consent:   Plan Discussed with: CRNA  Anesthesia Plan Comments:         Anesthesia Quick Evaluation

## 2011-12-02 NOTE — Progress Notes (Signed)
Patient admitted this morning with ankle fracture. Saw patient while he was  at dialysis. He is without any complaints today. Surgery planned for later today. Chart reviewed. Will continue to monitor patient.

## 2011-12-02 NOTE — Anesthesia Postprocedure Evaluation (Signed)
  Anesthesia Post-op Note  Patient: [name] [name]  Procedure(s) Performed: Procedure(s) (LRB): EXTERNAL FIXATION LEG (Left) IRRIGATION AND DEBRIDEMENT EXTREMITY (Left) HARDWARE REMOVAL (Left)  Patient Location: PACU  Anesthesia Type: General  Level of Consciousness: awake  Airway and Oxygen Therapy: Patient Spontanous Breathing  Post-op Pain: mild  Post-op Assessment: Post-op Vital signs reviewed  Post-op Vital Signs: Reviewed  Complications: No apparent anesthesia complications

## 2011-12-02 NOTE — Transfer of Care (Signed)
Immediate Anesthesia Transfer of Care Note  Patient: [name] [name]  Procedure(s) Performed: Procedure(s) (LRB): EXTERNAL FIXATION LEG (Left) IRRIGATION AND DEBRIDEMENT EXTREMITY (Left) HARDWARE REMOVAL (Left)  Patient Location: PACU  Anesthesia Type: General  Level of Consciousness: sedated, patient cooperative and responds to stimulation  Airway & Oxygen Therapy: Patient Spontanous Breathing and Patient connected to face mask oxygen  Post-op Assessment: Report given to PACU RN, Post -op Vital signs reviewed and stable, Patient moving all extremities and Patient moving all extremities X 4  Post vital signs: Reviewed and stable  Complications: No apparent anesthesia complications

## 2011-12-02 NOTE — Progress Notes (Signed)
PT Cancellation Note  Evaluation deferred today as patient going to dialysis and then to surgery for fx ankle.  Will evaluate tomorrow after surgery.   Thank you,  Olivia Canter, [place] [phone] 12/02/2011, 9:04 AM

## 2011-12-02 NOTE — Op Note (Signed)
NAMEGORMAN, SAFI               ACCOUNT NO.:  [id number]  MEDICAL RECORD NO.:  [id number]  LOCATION:  5036                         FACILITY:  [facility]  PHYSICIAN:  Lubertha Basque. Nabor Thomann, M.D.DATE OF BIRTH:  Jan 20, 1935  DATE OF PROCEDURE:  12/02/2011 DATE OF DISCHARGE:                              OPERATIVE REPORT   PREOPERATIVE DIAGNOSES: 1. Left ankle infection. 2. Left ankle distal tibia and fibula fractures.  POSTOPERATIVE DIAGNOSES: 1. Left ankle infection. 2. Left ankle distal tibia and fibula fractures.  PROCEDURE: 1. Left ankle removal of hardware. 2. Left ankle irrigation and debridement. 3. Left ankle external fixation.  ANESTHESIA:  General.  ATTENDING SURGEON:  Lubertha Basque. Jerl Santos, MD.  ASSISTANTLindwood Qua, PA.  INDICATION FOR PROCEDURE:  The patient is a 77 year old man who is about 2 months from a Maisonneuve injury to the left leg.  He underwent treatment by Dr. Chaney Malling with 2 syndesmotic screws.  He is beset with some neuropathy and cannot always feel things appropriately.  At some point, he began walking and developed a deformity of his leg.  This became extremely painful and he could no longer walk and presented to an outside emergency room yesterday.  His regular orthopedic surgeon was not available, so he was transferred in for management at [facility].  On x-ray and CT scan, he had lucencies about his loose screws.  He also had a distal fibula fracture along with a tibial plafond fracture, which was displaced in several planes.  He is offered removal of hardware, irrigation and debridement, and external fixation with plans for a delayed ORIF.  Informed operative consent was obtained after discussion of possible complications including reaction to anesthesia, infection, and malunion.  The patient was admitted to the Medical Service and underwent dialysis prior to this procedure.  SUMMARY OF FINDINGS AND PROCEDURE:  Under general  anesthesia, through a small portion of his old lateral incision, dissection was carried down to 2 grossly loose screws, which were removed with hemostats, also removed 2 washers.  There was some serous fluid, which emanated, but no frank pus and there was no odor.  This was thoroughly irrigated with about a liter of sterile solution.  We then placed a Biomet Vision external fixator in delta fashion.  I used fluoroscopy throughout the case to make appropriate intraoperative decisions, and read all these views myself.  We did get fractures lined up very well with the fixator. Lindwood Qua assisted throughout and was invaluable to the completion of the case, mostly in that he maintained reduction while I placed hardware.  PROCEDURE IN DETAIL:  The patient was taken to the operating suite where general anesthetic was applied without difficulty.  He was positioned supine with a bump under the left hip, and prepped and draped in normal sterile fashion.  After appropriate time-out, I made a small lateral incision with dissection down to the aforementioned 2 prominent screws and washers.  These were removed without difficulty without even using a screwdriver.  He did have some serous fluid emanating and I irrigated with about a liter of solution.  We then placed a pin in the calcaneal tuberosity under fluoroscopic guidance.  This  was done through 2 stab wounds on the posterior aspect of the heel.  I then placed 2 screws in the safe zone on the anteromedial tibia, which were Schanz pins from the Biomet set.  I achieved bicortical purchase on each and this was confirmed by fluoroscopy.  We then placed the appropriate pin and clamps.  Lindwood Qua maintained traction on the leg and reduced the fracture in several planes and then we tightened the construct, which was applied in delta fashion.  Fluoroscopy again confirmed good placement of hardware and reduction of fracture.  We irrigated all  the wounds followed by placement of Xeroform around all the pin sites and loose suturing of his lateral incision with nylon.  This was not closed tightly so as to allow some drainage.  I then applied sterile gauze and a loose Ace wrap around most of the device.  Estimated blood loss and fluids can be obtained from anesthesia records.  I did not use a tourniquet during the case.  DISPOSITION:  The patient was extubated in the operating room and taken to the recovery room in stable addition.  He was given a dose of Ancef in the operating room after the cultures had been taken from his hardware site.  He will stay on this for several days while we follow the culture results.  He will be admitted back to the Medicine Service and will resume dialysis treatments.  Our plan will be likely to return to the operating room for definitive ORIF at some point in the coming weeks.     Lubertha Basque Jerl Santos, M.D.     PGD/MEDQ  D:  12/02/2011  T:  12/02/2011  Job:  [id number]

## 2011-12-02 NOTE — Consult Note (Signed)
Referring Provider: No ref. provider found Primary Care Physician:  Louie Boston, MD, MD Primary Nephrologist:  Dr. Rico Junker  Reason for Consultation:  Medical management of ESRD  HPI: [phone] with h/o remote renal cell ca s/p right nephrectomy, now ESRD  on HD (T/T/S), borderline DM, HTN, no known or reported cardiac  disease or strokes, and recent left ankle fracture s/p screw  placement ~09/2011, now presents with distal tib/fib fracture and  loosened ankle hardware.    Past Medical History  Diagnosis Date  . Renal cell cancer     Remote. S/p right nephrectomy.   . ESRD on dialysis     Left fistula   . Ankle fracture     Left ~08/2011. [place] fracture  . DM (diabetes mellitus)     Per pt, borderline     No past surgical history on file.  Prior to Admission medications   Medication Sig Start Date End Date Taking? Authorizing Provider  acetaminophen (TYLENOL) 325 MG tablet Take 650 mg by mouth every 6 (six) hours as needed. For pain   Yes Historical Provider, MD  allopurinol (ZYLOPRIM) 100 MG tablet Take 200 mg by mouth daily.   Yes Historical Provider, MD  calcium acetate (PHOSLO) 667 MG capsule Take 1,334 mg by mouth 3 (three) times daily with meals.   Yes Historical Provider, MD  gabapentin (NEURONTIN) 300 MG capsule Take 300 mg by mouth at bedtime as needed. For pain   Yes Historical Provider, MD  lansoprazole (PREVACID) 15 MG capsule Take 15 mg by mouth daily.   Yes Historical Provider, MD  metoprolol (LOPRESSOR) 50 MG tablet Take 25 mg by mouth daily.   Yes Historical Provider, MD  multivitamin (RENA-VIT) TABS tablet Take 1 tablet by mouth daily.   Yes Historical Provider, MD  PARoxetine (PAXIL) 30 MG tablet Take 30 mg by mouth every morning.   Yes Historical Provider, MD  sevelamer (RENAGEL) 800 MG tablet Take 1,600 mg by mouth 3 (three) times daily with meals.   Yes Historical Provider, MD    Current Facility-Administered Medications  Medication Dose Route  Frequency Provider Last Rate Last Dose  . 0.9 %  sodium chloride infusion  250 mL Intravenous PRN Devonne Doughty, MD      . 0.9 %  sodium chloride infusion  100 mL Intravenous PRN Garnetta Buddy, MD      . 0.9 %  sodium chloride infusion  100 mL Intravenous PRN Garnetta Buddy, MD      . acetaminophen (TYLENOL) tablet 650 mg  650 mg Oral Q6H PRN Devonne Doughty, MD       Or  . acetaminophen (TYLENOL) suppository 650 mg  650 mg Rectal Q6H PRN Devonne Doughty, MD      . allopurinol (ZYLOPRIM) tablet 200 mg  200 mg Oral Daily Devonne Doughty, MD      . alteplase (CATHFLO ACTIVASE) injection 2 mg  2 mg Intracatheter Once PRN Garnetta Buddy, MD      . calcium acetate (PHOSLO) capsule 1,334 mg  1,334 mg Oral TID WC Devonne Doughty, MD      . docusate sodium (COLACE) capsule 100 mg  100 mg Oral BID Devonne Doughty, MD   100 mg at 12/02/11 0145  . feeding supplement (NEPRO CARB STEADY) liquid 237 mL  237 mL Oral PRN Garnetta Buddy, MD      . heparin injection 1,000 Units  1,000 Units Dialysis PRN Garnetta Buddy, MD      .  heparin injection 5,000 Units  5,000 Units Subcutaneous Q8H Devonne Doughty, MD   5,000 Units at 12/02/11 0600  . HYDROcodone-acetaminophen (NORCO) 5-325 MG per tablet 1-2 tablet  1-2 tablet Oral Q6H PRN Devonne Doughty, MD      . lidocaine (XYLOCAINE) 1 % injection 5 mL  5 mL Intradermal PRN Garnetta Buddy, MD      . lidocaine-prilocaine (EMLA) cream 1 application  1 application Topical PRN Garnetta Buddy, MD      . metoprolol tartrate (LOPRESSOR) tablet 25 mg  25 mg Oral Daily Devonne Doughty, MD      . morphine 2 MG/ML injection 2 mg  2 mg Intravenous Q4H PRN Devonne Doughty, MD   2 mg at 12/02/11 1610  . morphine 2 MG/ML injection        2 mg at 12/02/11 0145  . multivitamin (RENA-VIT) tablet 1 tablet  1 tablet Oral QHS Devonne Doughty, MD      . ondansetron (ZOFRAN) tablet 4 mg  4 mg Oral Q6H PRN Devonne Doughty, MD       Or  . ondansetron (ZOFRAN) injection 4 mg  4 mg Intravenous Q6H PRN Devonne Doughty, MD      .  pantoprazole (PROTONIX) EC tablet 20 mg  20 mg Oral Q1200 Devonne Doughty, MD      . PARoxetine (PAXIL) tablet 30 mg  30 mg Oral Daily Devonne Doughty, MD      . pentafluoroprop-tetrafluoroeth (GEBAUERS) aerosol 1 application  1 application Topical PRN Garnetta Buddy, MD      . polyethylene glycol (MIRALAX / GLYCOLAX) packet 17 g  17 g Oral Daily PRN Devonne Doughty, MD      . senna (SENOKOT) tablet 8.6 mg  1 tablet Oral BID Devonne Doughty, MD   8.6 mg at 12/02/11 0317  . sevelamer (RENAGEL) tablet 1,600 mg  1,600 mg Oral TID WC Devonne Doughty, MD      . sodium chloride 0.9 % injection 3 mL  3 mL Intravenous Q12H Devonne Doughty, MD   3 mL at 12/02/11 0145  . sodium chloride 0.9 % injection 3 mL  3 mL Intravenous PRN Devonne Doughty, MD        Allergies as of 12/01/2011 - Review Complete 12/01/2011  Allergen Reaction Noted  . Ivp dye (iodinated diagnostic agents)  12/01/2011  . Ofloxacin Nausea And Vomiting 12/01/2011  . Oxycodone  12/01/2011  . Sulfa antibiotics Rash 12/01/2011  . Sulfamethoxazole w-trimethoprim Rash 12/01/2011    No family history on file.  History   Social History  . Marital Status: Married    Spouse Name: N/A    Number of Children: N/A  . Years of Education: N/A   Occupational History  . Not on file.   Social History Main Topics  . Smoking status: Not on file  . Smokeless tobacco: Not on file  . Alcohol Use: Not on file  . Drug Use: Not on file  . Sexually Active: Not on file   Other Topics Concern  . Not on file   Social History Narrative  . No narrative on file    Review of Systems: Gen: Denies any fever, chills, sweats, anorexia, fatigue, weakness, malaise, weight loss, and sleep disorder HEENT: No visual complaints, No history of Retinopathy. Normal external appearance No Epistaxis or Sore throat. No sinusitis.   CV: Denies chest pain, angina, palpitations, syncope, orthopnea, PND, peripheral  edema, and claudication. Resp: Denies dyspnea at rest, dyspnea with  exercise, cough, sputum, wheezing, coughing up blood, and pleurisy. GI: Denies vomiting blood, jaundice, and fecal incontinence.   Denies dysphagia or odynophagia. GU : Denies urinary burning, blood in urine, urinary frequency, urinary hesitancy, nocturnal urination, and urinary incontinence.  No renal calculi. MS: Denies joint pain, limitation of movement, and swelling, stiffness, low back pain, extremity pain. Denies muscle weakness, cramps, atrophy.  No use of non steroidal antiinflammatory drugs. Derm: Denies rash, itching, dry skin, hives, moles, warts, or unhealing ulcers.  Psych: Denies depression, anxiety, memory loss, suicidal ideation, hallucinations, paranoia, and confusion. Heme: Denies bruising, bleeding, and enlarged lymph nodes. Neuro: No headache.  No diplopia. No dysarthria.  No dysphasia.  No history of CVA.  No Seizures. No paresthesias.  No weakness. Endocrine No DM.  No Thyroid disease.  No Adrenal disease.  Physical Exam: Vital signs in last 24 hours: Temp:  [98.8 F (37.1 C)-99.4 F (37.4 C)] 98.8 F (37.1 C) (06/01 0529) Pulse Rate:  [63-65] 63  (06/01 0529) Resp:  [16] 16  (06/01 0529) BP: (109-116)/(63-66) 116/63 mmHg (06/01 0529) SpO2:  [93 %-94 %] 93 % (06/01 0529) Weight:  [97.1 kg (214 lb 1.1 oz)] 97.1 kg (214 lb 1.1 oz) (05/31 2200) Last BM Date: 12/01/11 General:   Alert,  Well-developed, well-nourished, pleasant and cooperative in NAD Head:  Normocephalic and atraumatic. Eyes:  Sclera clear, no icterus.   Conjunctiva pink. Ears:  Normal auditory acuity. Nose:  No deformity, discharge,  or lesions. Mouth:  No deformity or lesions, dentition normal. Neck:  Supple; no masses or thyromegaly. JVP not elevated Lungs:  Clear throughout to auscultation.   No wheezes, crackles, or rhonchi. No acute distress. Heart:  Regular rate and rhythm; no murmurs, clicks, rubs,  or gallops. Abdomen:  Soft, nontender and nondistended. No masses, hepatosplenomegaly or hernias  noted. Normal bowel sounds, without guarding, and without rebound.   Msk:  Symmetrical without gross deformities. Normal posture. Pulses:  No carotid, renal, femoral bruits. DP and PT symmetrical and equal Extremities:  Without clubbing or edema. Neurologic:  Alert and  oriented x4;  grossly normal neurologically. Skin:  Intact without significant lesions or rashes. Cervical Nodes:  No significant cervical adenopathy. Psych:  Alert and cooperative. Normal mood and affect.  Intake/Output from previous day:   Intake/Output this shift: Total I/O In: -  Out: 200 [Urine:200]  Lab Results:  [facility] 12/02/11 0620  WBC 8.8  HGB 11.2*  HCT 33.6*  PLT 174   BMET  Basename 12/02/11 0620  NA 137  K 4.5  CL 94*  CO2 27  GLUCOSE 104*  BUN 41*  CREATININE 7.56*  CALCIUM 8.6  PHOS --   LFT No results found for this basename: PROT,ALBUMIN,AST,ALT,ALKPHOS,BILITOT,BILIDIR,IBILI in the last 72 hours PT/INR  Basename 12/02/11 0620  LABPROT 14.1  INR 1.07   Hepatitis Panel No results found for this basename: HEPBSAG,HCVAB,HEPAIGM,HEPBIGM in the last 72 hours  Studies/Results: Portable Chest 1 View  12/02/2011  *RADIOLOGY REPORT*  Clinical Data: Preop left ankle injury  PORTABLE CHEST - 1 VIEW  Comparison: None.  Findings: Stable enlarged cardiac silhouette with ectatic aorta. No effusion, infiltrate, or pneumothorax. No acute osseous abnormality.  IMPRESSION: Mild cardiomegaly without acute cardiopulmonary findings.  Original Report Authenticated By: Genevive Bi, M.D.    Assessment/Plan:  ESRD-TTS dialysis  ANEMIA-stable no ESA  MBD-renal diet binders  HTN/VOL-controlled with dialysis  ACCESS-AVF   LOS: 1 Arius Harnois W @TODAY @9 :41 AM

## 2011-12-02 NOTE — Anesthesia Procedure Notes (Signed)
Procedure Name: Intubation Date/Time: 12/02/2011 3:55 PM Performed by: Wray Kearns A Pre-anesthesia Checklist: Patient identified, Timeout performed, Emergency Drugs available, Suction available and Patient being monitored Patient Re-evaluated:Patient Re-evaluated prior to inductionOxygen Delivery Method: Circle system utilized Preoxygenation: Pre-oxygenation with 100% oxygen Intubation Type: IV induction and Cricoid Pressure applied Ventilation: Mask ventilation without difficulty and Oral airway inserted - appropriate to patient size Laryngoscope Size: Mac and 4 Grade View: Grade I Tube type: Oral Tube size: 7.5 mm Number of attempts: 1 Airway Equipment and Method: Stylet Placement Confirmation: ETT inserted through vocal cords under direct vision,  breath sounds checked- equal and bilateral and positive ETCO2 Secured at: 22 cm Tube secured with: Tape Dental Injury: Teeth and Oropharynx as per pre-operative assessment

## 2011-12-03 DIAGNOSIS — N186 End stage renal disease: Secondary | ICD-10-CM

## 2011-12-03 DIAGNOSIS — I10 Essential (primary) hypertension: Secondary | ICD-10-CM

## 2011-12-03 DIAGNOSIS — S82209A Unspecified fracture of shaft of unspecified tibia, initial encounter for closed fracture: Secondary | ICD-10-CM

## 2011-12-03 DIAGNOSIS — S82409A Unspecified fracture of shaft of unspecified fibula, initial encounter for closed fracture: Secondary | ICD-10-CM

## 2011-12-03 DIAGNOSIS — G629 Polyneuropathy, unspecified: Secondary | ICD-10-CM | POA: Diagnosis present

## 2011-12-03 MED ORDER — LANSOPRAZOLE 15 MG PO CPDR
15.0000 mg | DELAYED_RELEASE_CAPSULE | Freq: Every day | ORAL | Status: DC
  Administered 2011-12-03 – 2011-12-07 (×2): 15 mg via ORAL
  Filled 2011-12-03 (×6): qty 1

## 2011-12-03 MED ORDER — GABAPENTIN 300 MG PO CAPS
300.0000 mg | ORAL_CAPSULE | Freq: Every evening | ORAL | Status: DC | PRN
  Filled 2011-12-03: qty 1

## 2011-12-03 MED ORDER — NON FORMULARY: 15.0000 mg/d | Freq: Every day | Status: DC

## 2011-12-03 MED ORDER — ZOLPIDEM TARTRATE 5 MG PO TABS
5.0000 mg | ORAL_TABLET | Freq: Every evening | ORAL | Status: DC | PRN
  Administered 2011-12-04: 5 mg via ORAL
  Filled 2011-12-03: qty 1

## 2011-12-03 MED FILL — Morphine Sulfate Inj 10 MG/ML: INTRAMUSCULAR | Qty: 1 | Status: AC

## 2011-12-03 NOTE — Progress Notes (Signed)
[facility] ROUNDING NOTE   Subjective:   Interval History: none.  Objective:  Vital signs in last 24 hours:  Temp:  [97.8 F (36.6 C)-98.8 F (37.1 C)] 97.8 F (36.6 C) (06/02 0449) Pulse Rate:  [63-94] 73  (06/02 0449) Resp:  [12-26] 16  (06/02 0449) BP: (97-212)/(52-74) 143/74 mmHg (06/02 0449) SpO2:  [82 %-100 %] 98 % (06/02 0449) Weight:  [93.2 kg (205 lb 7.5 oz)-96.7 kg (213 lb 3 oz)] 96.7 kg (213 lb 3 oz) (06/02 0449)  Weight change: -2.1 kg (-4 lb 10.1 oz) Filed Weights   12/02/11 1010 12/02/11 1400 12/03/11 0449  Weight: 95 kg (209 lb 7 oz) 93.2 kg (205 lb 7.5 oz) 96.7 kg (213 lb 3 oz)    Intake/Output: I/O last 3 completed shifts: In: 1200 [I.V.:700; IV Piggyback:500] Out: 1900 [Urine:200; Other:1600; Blood:100]   Intake/Output this shift:     CVS- RRR RS- CTA ABD- BS present soft non-distended EXT- no edema   Basic Metabolic Panel:  Lab 12/02/11 1610  NA 137  K 4.5  CL 94*  CO2 27  GLUCOSE 104*  BUN 41*  CREATININE 7.56*  CALCIUM 8.6  MG --  PHOS --    Liver Function Tests: No results found for this basename: AST:5,ALT:5,ALKPHOS:5,BILITOT:5,PROT:5,ALBUMIN:5 in the last 168 hours No results found for this basename: LIPASE:5,AMYLASE:5 in the last 168 hours No results found for this basename: AMMONIA:3 in the last 168 hours  CBC:  Lab 12/02/11 0620  WBC 8.8  NEUTROABS --  HGB 11.2*  HCT 33.6*  MCV 98.8  PLT 174    Cardiac Enzymes: No results found for this basename: CKTOTAL:5,CKMB:5,CKMBINDEX:5,TROPONINI:5 in the last 168 hours  BNP: No components found with this basename: POCBNP:5  CBG:  Lab 12/03/11 0634 12/02/11 2042  GLUCAP 100* 117*    Microbiology: Results for orders placed during the hospital encounter of 12/01/11  WOUND CULTURE     Status: Normal (Preliminary result)   Collection Time   12/02/11  4:39 PM      Component Value Range Status Comment   Specimen Description WOUND LEFT LEG   Final    Special Requests NONE   Final    Gram Stain PENDING   Incomplete    Culture NO GROWTH 1 DAY   Final    Report Status PENDING   Incomplete     Coagulation Studies:  Basename 12/02/11 0620  LABPROT 14.1  INR 1.07    Urinalysis: No results found for this basename: COLORURINE:2,APPERANCEUR:2,LABSPEC:2,PHURINE:2,GLUCOSEU:2,HGBUR:2,BILIRUBINUR:2,KETONESUR:2,PROTEINUR:2,UROBILINOGEN:2,NITRITE:2,LEUKOCYTESUR:2 in the last 72 hours    Imaging: Dg Ankle 2 Views Left  12/02/2011  *RADIOLOGY REPORT*  Clinical Data: 77 year old male undergoing left ankle external fixation.  LEFT ANKLE - 2 VIEW  Comparison: 12/01/2011 and earlier.  Fluoroscopy time of 0.5 minutes was utilized.  Findings: Three intraoperative fluoroscopic views of the left ankle.  Previously seen cannulated screws have been removed.  The latter two images demonstrate external fixator hardware placement probably through the calcaneus.  IMPRESSION: Left ankle ORIF and external fixation hardware revision as above.  Original Report Authenticated By: Harley Hallmark, M.D.   Portable Chest 1 View  12/02/2011  *RADIOLOGY REPORT*  Clinical Data: Preop left ankle injury  PORTABLE CHEST - 1 VIEW  Comparison: None.  Findings: Stable enlarged cardiac silhouette with ectatic aorta. No effusion, infiltrate, or pneumothorax. No acute osseous abnormality.  IMPRESSION: Mild cardiomegaly without acute cardiopulmonary findings.  Original Report Authenticated By: Genevive Bi, M.D.     Medications:        .  ceFAZolin (ANCEF) IV  1 g Intravenous Q24H  . docusate sodium  100 mg Oral BID  . heparin  5,000 Units Subcutaneous Q8H  . metoprolol  25 mg Oral Daily  . multivitamin  1 tablet Oral QHS  . pantoprazole  20 mg Oral Q1200  . PARoxetine  30 mg Oral Daily  . senna  1 tablet Oral BID  . sevelamer  1,600 mg Oral TID WC  . DISCONTD: allopurinol  200 mg Oral Daily  . DISCONTD: calcium acetate  1,334 mg Oral TID WC  . DISCONTD:  ceFAZolin (ANCEF)  IV  1 g Intravenous Q8H  . DISCONTD: sodium chloride  3 mL Intravenous Q12H   sodium chloride, sodium chloride, feeding supplement (NEPRO CARB STEADY), fentaNYL, gabapentin, heparin, HYDROcodone-acetaminophen, lidocaine, lidocaine-prilocaine, metoCLOPramide (REGLAN) injection, metoCLOPramide, morphine injection, ondansetron (ZOFRAN) IV, ondansetron, pentafluoroprop-tetrafluoroeth, polyethylene glycol, DISCONTD: sodium chloride, DISCONTD: acetaminophen, DISCONTD: acetaminophen, DISCONTD: alteplase DISCONTD: ondansetron (ZOFRAN) IV, DISCONTD: ondansetron, DISCONTD: sodium chloride, DISCONTD: sodium chloride irrigation  Assessment/ Plan:  76yoM with h/o remote renal cell ca s/p right nephrectomy, now ESRD  on HD (T/T/S), borderline DM, HTN, no known or reported cardiac  disease or strokes, and recent left ankle fracture s/p screw  placement ~09/2011, now presents with distal tib/fib fracture and  loosened ankle hardware.   ESRD-TTS dialysis   ANEMIA-stable no ESA  MBD-renal diet binders   HTN/VOL-controlled with dialysis   ACCESS-AVF   LOS: 2 Hari Casaus W @TODAY @11 :21 AM

## 2011-12-03 NOTE — Evaluation (Signed)
Physical Therapy Evaluation Patient Details Name: [name] [name] MRN: [id number] DOB: 1934-10-16 Today's Date: 12/03/2011 Time: 4098-1191 PT Time Calculation (min): 23 min  PT Assessment / Plan / Recommendation Clinical Impression  Pt with ankle fx recently who d/c'ed home and fell again, now with tib/fib fx. External fixation on LLE. Pt is limited due to pain and limited mobility with weight bearing status. Pt will benefit from skilled PT in the acute care setting in order to maximize functional mobility and safety prior to d/c home as pt needs to be able to ambulate with RW for short distances.    PT Assessment  Patient needs continued PT services    Follow Up Recommendations  Home health PT;Supervision/Assistance - 24 hour    Barriers to Discharge Inaccessible home environment too small for d/c    lEquipment Recommendations  Rolling walker with 5" wheels;Wheelchair (measurements) (WIDE RW)    Recommendations for Other Services     Frequency Min 5X/week    Precautions / Restrictions Precautions Precautions: Fall Restrictions Weight Bearing Restrictions: Yes LLE Weight Bearing: Non weight bearing         Mobility  Bed Mobility Bed Mobility: Supine to Sit;Sitting - Scoot to Edge of Bed Supine to Sit: 3: Mod assist;With rails;HOB elevated Sitting - Scoot to Edge of Bed: 4: Min guard Details for Bed Mobility Assistance: VC for hand placement and sequencing. Pt able to flex hip to bring LLE over into bed. Assist through trunk with support of LLE into sitting Transfers Transfers: Sit to Stand;Stand to Sit Sit to Stand: 4: Min assist;With upper extremity assist;From bed Stand to Sit: 4: Min assist;With upper extremity assist;To chair/3-in-1 Stand Pivot Transfers: 1: +2 Total assist;With armrests Stand Pivot Transfers: Patient Percentage: 70% Details for Transfer Assistance: +2 assist for safety during transfer, may possibly be able to transfer x 1   Ambulation/Gait Ambulation/Gait Assistance: Not tested (comment)    Exercises     PT Diagnosis: Acute pain;Difficulty walking  PT Problem List: Decreased strength;Decreased range of motion;Decreased activity tolerance;Decreased mobility;Decreased knowledge of use of DME;Decreased safety awareness;Decreased knowledge of precautions;Pain PT Treatment Interventions: Gait training;DME instruction;Stair training;Functional mobility training;Therapeutic activities;Therapeutic exercise;Patient/family education;Wheelchair mobility training   PT Goals Acute Rehab PT Goals PT Goal Formulation: With patient Time For Goal Achievement: 12/10/11 Potential to Achieve Goals: Fair Pt will go Supine/Side to Sit: with modified independence PT Goal: Supine/Side to Sit - Progress: Goal set today Pt will go Sit to Supine/Side: with modified independence PT Goal: Sit to Supine/Side - Progress: Goal set today Pt will go Sit to Stand: with supervision PT Goal: Sit to Stand - Progress: Goal set today Pt will go Stand to Sit: with supervision PT Goal: Stand to Sit - Progress: Goal set today Pt will Transfer Bed to Chair/Chair to Bed: with min assist PT Transfer Goal: Bed to Chair/Chair to Bed - Progress: Goal set today Pt will Ambulate: 16 - 50 feet;with min assist;with rolling walker PT Goal: Ambulate - Progress: Goal set today Pt will Go Up / Down Stairs: 1-2 stairs;with min assist;with least restrictive assistive device PT Goal: Up/Down Stairs - Progress: Goal set today Pt will Propel Wheelchair: > 150 feet;Independently;Other (comment) (assist for set up)  Visit Information  Last PT Received On: 12/03/11 Assistance Needed: +1    Subjective Data      Prior Functioning  Home Living Lives With: Spouse Available Help at Discharge: Family;Available 24 hours/day Type of Home: House Home Access: Stairs to enter [employer] of Steps:  1 Entrance Stairs-Rails: None Home Layout: One  level Bathroom Shower/Tub: Forensic scientist: Standard Bathroom Accessibility: Yes How Accessible: Accessible via walker Home Adaptive Equipment: Walker - rolling;Straight cane Prior Function Level of Independence: Independent with assistive device(s) (cane) Able to Take Stairs?: Yes Driving: No Vocation: Retired Musician: No difficulties Dominant Hand: Right    Cognition  Overall Cognitive Status: Appears within functional limits for tasks assessed/performed Arousal/Alertness: Awake/alert Orientation Level: Appears intact for tasks assessed Behavior During Session: [facility] for tasks performed    Extremity/Trunk Assessment Right Lower Extremity Assessment RLE ROM/Strength/Tone: Within functional levels RLE Sensation: WFL - Light Touch Left Lower Extremity Assessment LLE ROM/Strength/Tone: Unable to fully assess;Due to precautions;Due to pain (LLE in external fixation. Hip WFL)   Balance    End of Session PT - End of Session Equipment Utilized During Treatment: Gait belt;Oxygen Activity Tolerance: Patient tolerated treatment well Patient left: in chair;with call bell/phone within reach;with family/visitor present Nurse Communication: Mobility status   Milana Kidney 12/03/2011, 1:49 PM  12/03/2011 Milana Kidney DPT PAGER: [phone] OFFICE: [phone]

## 2011-12-03 NOTE — Progress Notes (Signed)
Subjective: 1 Day Post-Op Procedure(s) (LRB): EXTERNAL FIXATION LEG (Left) IRRIGATION AND DEBRIDEMENT EXTREMITY (Left) HARDWARE REMOVAL (Left)  Activity level:  bedrest Diet tolerance:  poor Voiding:  Dialysis patient Patient reports pain as mild.    Objective: Vital signs in last 24 hours: Temp:  [97.8 F (36.6 C)-98.8 F (37.1 C)] 97.8 F (36.6 C) (06/02 0449) Pulse Rate:  [59-94] 73  (06/02 0449) Resp:  [12-26] 16  (06/02 0449) BP: (84-212)/(45-74) 143/74 mmHg (06/02 0449) SpO2:  [82 %-100 %] 98 % (06/02 0449) Weight:  [93.2 kg (205 lb 7.5 oz)-96.7 kg (213 lb 3 oz)] 96.7 kg (213 lb 3 oz) (06/02 0449)  Labs:  Basename 12/02/11 0620  HGB 11.2*    Basename 12/02/11 0620  WBC 8.8  RBC 3.40*  HCT 33.6*  PLT 174    Basename 12/02/11 0620  NA 137  K 4.5  CL 94*  CO2 27  BUN 41*  CREATININE 7.56*  GLUCOSE 104*  CALCIUM 8.6    Basename 12/02/11 0620  LABPT --  INR 1.07    Physical Exam:  Neurologically intact ABD soft Sensation intact distally Intact pulses distally Compartment soft  Assessment/Plan:  1 Day Post-Op Procedure(s) (LRB): EXTERNAL FIXATION LEG (Left) IRRIGATION AND DEBRIDEMENT EXTREMITY (Left) HARDWARE REMOVAL (Left) Advance diet Up with therapy Mostly bed to wheelchair but needs to master walker NWB for use in his house Suspect he will need a couple of days of PT Good plan might be PT for a couple of days leading to dialysis Tuesday and home that afternoon if all well Following cultures and will adjust abx as indicated    Mekayla Soman G 12/03/2011, 10:06 AM

## 2011-12-03 NOTE — Progress Notes (Signed)
Subjective: Patient complains of some mild ankle pain this morning.  Objective: Weight change: -2.1 kg (-4 lb 10.1 oz)  Intake/Output Summary (Last 24 hours) at 12/03/11 0925 Last data filed at 12/02/11 1705  Gross per 24 hour  Intake   1200 ml  Output   1700 ml  Net   -500 ml    Filed Vitals:   12/03/11 0449  BP: 143/74  Pulse: 73  Temp: 97.8 F (36.6 C)  Resp: 16   General: Patient does not seem to be in any acute distress. Cardiovascular: Regular rate rhythm Lungs: Clear to auscultation bilaterally. Abdomen: Positive bowel sounds Extremities: Left lower extremity wrapped secondary to surgery.  Lab Results: Reviewed  Micro Results: Recent Results (from the past 240 hour(s))  WOUND CULTURE     Status: Normal (Preliminary result)   Collection Time   12/02/11  4:39 PM      Component Value Range Status Comment   Specimen Description WOUND LEFT LEG   Final    Special Requests NONE   Final    Gram Stain PENDING   Incomplete    Culture NO GROWTH 1 DAY   Final    Report Status PENDING   Incomplete     Studies/Results: Dg Ankle 2 Views Left  12/02/2011  *RADIOLOGY REPORT*  Clinical Data: 77 year old male undergoing left ankle external fixation.  LEFT ANKLE - 2 VIEW  Comparison: 12/01/2011 and earlier.  Fluoroscopy time of 0.5 minutes was utilized.  Findings: Three intraoperative fluoroscopic views of the left ankle.  Previously seen cannulated screws have been removed.  The latter two images demonstrate external fixator hardware placement probably through the calcaneus.  IMPRESSION: Left ankle ORIF and external fixation hardware revision as above.  Original Report Authenticated By: Harley Hallmark, M.D.   Portable Chest 1 View  12/02/2011  *RADIOLOGY REPORT*  Clinical Data: Preop left ankle injury  PORTABLE CHEST - 1 VIEW  Comparison: None.  Findings: Stable enlarged cardiac silhouette with ectatic aorta. No effusion, infiltrate, or pneumothorax. No acute osseous abnormality.   IMPRESSION: Mild cardiomegaly without acute cardiopulmonary findings.  Original Report Authenticated By: Genevive Bi, M.D.   Medications: Scheduled Meds:   .  ceFAZolin (ANCEF) IV  1 g Intravenous Q24H  . docusate sodium  100 mg Oral BID  . heparin  5,000 Units Subcutaneous Q8H  . metoprolol  25 mg Oral Daily  . multivitamin  1 tablet Oral QHS  . pantoprazole  20 mg Oral Q1200  . PARoxetine  30 mg Oral Daily  . senna  1 tablet Oral BID  . sevelamer  1,600 mg Oral TID WC  . DISCONTD: allopurinol  200 mg Oral Daily  . DISCONTD: calcium acetate  1,334 mg Oral TID WC  . DISCONTD:  ceFAZolin (ANCEF) IV  1 g Intravenous Q8H  . DISCONTD: sodium chloride  3 mL Intravenous Q12H   Continuous Infusions:  PRN Meds:.sodium chloride, sodium chloride, feeding supplement (NEPRO CARB STEADY), fentaNYL, gabapentin, heparin, HYDROcodone-acetaminophen, lidocaine, lidocaine-prilocaine, metoCLOPramide (REGLAN) injection, metoCLOPramide, morphine injection, ondansetron (ZOFRAN) IV, ondansetron, pentafluoroprop-tetrafluoroeth, polyethylene glycol, DISCONTD: sodium chloride, DISCONTD: acetaminophen, DISCONTD: acetaminophen, DISCONTD: alteplase DISCONTD: ondansetron (ZOFRAN) IV, DISCONTD: ondansetron, DISCONTD: sodium chloride, DISCONTD: sodium chloride irrigation  Assessment/Plan:  DM (diabetes mellitus) ()    blood sugars are controlled. Will continue to monitor.   Ankle fracture () Patient did well with surgery. Follow up per orthopedics.     ESRD on dialysis () Patient had hemodialysis yesterday. Follow up per nephrology.  Neuropathy (12/03/2011) Resume his gabapentin .  Disposition: We'll await orthopedic recommendations.      LOS: 2 days   [name] [name] 12/03/2011, 9:25 AM Pager [phone]

## 2011-12-04 ENCOUNTER — Encounter (HOSPITAL_COMMUNITY): Payer: Self-pay | Admitting: Orthopaedic Surgery

## 2011-12-04 DIAGNOSIS — S82209A Unspecified fracture of shaft of unspecified tibia, initial encounter for closed fracture: Secondary | ICD-10-CM

## 2011-12-04 DIAGNOSIS — I10 Essential (primary) hypertension: Secondary | ICD-10-CM

## 2011-12-04 DIAGNOSIS — S82409A Unspecified fracture of shaft of unspecified fibula, initial encounter for closed fracture: Secondary | ICD-10-CM

## 2011-12-04 DIAGNOSIS — N186 End stage renal disease: Secondary | ICD-10-CM

## 2011-12-04 LAB — RENAL FUNCTION PANEL
BUN: 48 mg/dL — ABNORMAL HIGH (ref 6–23)
CO2: 26 mEq/L (ref 19–32)
Calcium: 7.7 mg/dL — ABNORMAL LOW (ref 8.4–10.5)
GFR calc Af Amer: 8 mL/min — ABNORMAL LOW (ref >90)
Glucose, Bld: 114 mg/dL — ABNORMAL HIGH (ref 70–99)
Phosphorus: 5.5 mg/dL — ABNORMAL HIGH (ref 2.3–4.6)
Sodium: 134 mEq/L — ABNORMAL LOW (ref 135–145)

## 2011-12-04 LAB — GLUCOSE, CAPILLARY
Glucose-Capillary: 127 mg/dL — ABNORMAL HIGH (ref 70–99)
Glucose-Capillary: 117 mg/dL — ABNORMAL HIGH (ref 70–99)
Glucose-Capillary: 90 mg/dL (ref 70–99)

## 2011-12-04 LAB — WOUND CULTURE: Culture: NO GROWTH

## 2011-12-04 LAB — CBC
HCT: 30.9 % — ABNORMAL LOW (ref 39.0–52.0)
Hemoglobin: 10.5 g/dL — ABNORMAL LOW (ref 13.0–17.0)
MCHC: 34 g/dL (ref 30.0–36.0)
MCV: 97.8 fL (ref 78.0–100.0)

## 2011-12-04 MED ORDER — GABAPENTIN 300 MG PO CAPS
300.0000 mg | ORAL_CAPSULE | Freq: Every day | ORAL | Status: DC
  Administered 2011-12-04 – 2011-12-08 (×5): 300 mg via ORAL
  Filled 2011-12-04 (×6): qty 1

## 2011-12-04 MED ORDER — POLYETHYLENE GLYCOL 3350 17 G PO PACK
17.0000 g | PACK | Freq: Two times a day (BID) | ORAL | Status: DC
  Administered 2011-12-04 – 2011-12-09 (×4): 17 g via ORAL
  Filled 2011-12-04 (×11): qty 1

## 2011-12-04 NOTE — Progress Notes (Signed)
Subjective: 2 Days Post-Op Procedure(s) (LRB): EXTERNAL FIXATION LEG (Left) IRRIGATION AND DEBRIDEMENT EXTREMITY (Left) HARDWARE REMOVAL (Left)  Activity level:  NWB L Diet tolerance:  ok Voiding:  no Patient reports pain as 3 on 0-10 scale.    Objective: Vital signs in last 24 hours: Temp:  [97.1 F (36.2 C)-98.7 F (37.1 C)] 98.3 F (36.8 C) (06/03 0518) Pulse Rate:  [69-76] 76  (06/03 0518) Resp:  [16] 16  (06/03 0518) BP: (113-130)/(67-74) 130/74 mmHg (06/03 0518) SpO2:  [91 %-96 %] 96 % (06/03 0518) Weight:  [99.9 kg (220 lb 3.8 oz)] 99.9 kg (220 lb 3.8 oz) (06/03 0518)  Labs:  [facility] 12/04/11 0625 12/02/11 0620  HGB 10.5* 11.2*    Basename 12/04/11 0625 12/02/11 0620  WBC 8.0 8.8  RBC 3.16* 3.40*  HCT 30.9* 33.6*  PLT 147* 174    Basename 12/04/11 0625 12/02/11 0620  NA 134* 137  K 3.9 4.5  CL 94* 94*  CO2 26 27  BUN 48* 41*  CREATININE 7.09* 7.56*  GLUCOSE 114* 104*  CALCIUM 7.7* 8.6    Basename 12/02/11 0620  LABPT --  INR 1.07    Physical Exam:  Neurologically intact ABD soft Neurovascular intact Sensation intact distally No cellulitis present EX FIX wnl  Assessment/Plan:  2 Days Post-Op Procedure(s) (LRB): EXTERNAL FIXATION LEG (Left) IRRIGATION AND DEBRIDEMENT EXTREMITY (Left) HARDWARE REMOVAL (Left) Advance diet Cultures neg so far Cont. On IV Ancef Dr. Carola Frost consulted    Sonda Coppens R 12/04/2011, 9:38 AM

## 2011-12-04 NOTE — Progress Notes (Signed)
S:pain controlled.  Bowels not moving.  Eating some O:BP 130/74  Pulse 76  Temp(Src) 98.3 F (36.8 C) (Oral)  Resp 16  Ht 5\' 8"  (1.727 m)  Wt 99.9 kg (220 lb 3.8 oz)  BMI 33.49 kg/m2  SpO2 96%  Intake/Output Summary (Last 24 hours) at 12/04/11 0941 Last data filed at 12/04/11 0506  Gross per 24 hour  Intake    480 ml  Output     50 ml  Net    430 ml   Weight change: 4.9 kg (10 lb 12.8 oz) JYN:WGNFA and alert CVS:RRR Resp:clear ant Abd:+ BS NTND Ext:no edema.  Lt leg ext pinned  Lt AVF + bruit NEURO:CNI Ox3      .  ceFAZolin (ANCEF) IV  1 g Intravenous Q24H  . docusate sodium  100 mg Oral BID  . heparin  5,000 Units Subcutaneous Q8H  . lansoprazole  15 mg Oral Q1200  . metoprolol  25 mg Oral Daily  . multivitamin  1 tablet Oral QHS  . PARoxetine  30 mg Oral Daily  . polyethylene glycol  17 g Oral BID  . senna  1 tablet Oral BID  . sevelamer  1,600 mg Oral TID WC  . DISCONTD: NON FORMULARY 15 mg/day  15 mg/day Oral Daily  . DISCONTD: pantoprazole  20 mg Oral Q1200   Dg Ankle 2 Views Left  12/02/2011  *RADIOLOGY REPORT*  Clinical Data: 77 year old male undergoing left ankle external fixation.  LEFT ANKLE - 2 VIEW  Comparison: 12/01/2011 and earlier.  Fluoroscopy time of 0.5 minutes was utilized.  Findings: Three intraoperative fluoroscopic views of the left ankle.  Previously seen cannulated screws have been removed.  The latter two images demonstrate external fixator hardware placement probably through the calcaneus.  IMPRESSION: Left ankle ORIF and external fixation hardware revision as above.  Original Report Authenticated By: Harley Hallmark, M.D.   BMET    Component Value Date/Time   NA 134* 12/04/2011 0625   K 3.9 12/04/2011 0625   CL 94* 12/04/2011 0625   CO2 26 12/04/2011 0625   GLUCOSE 114* 12/04/2011 0625   BUN 48* 12/04/2011 0625   CREATININE 7.09* 12/04/2011 0625   CALCIUM 7.7* 12/04/2011 0625   GFRNONAA 7* 12/04/2011 0625   GFRAA 8* 12/04/2011 0625   CBC    Component  Value Date/Time   WBC 8.0 12/04/2011 0625   RBC 3.16* 12/04/2011 0625   HGB 10.5* 12/04/2011 0625   HCT 30.9* 12/04/2011 0625   PLT 147* 12/04/2011 0625   MCV 97.8 12/04/2011 0625   MCH 33.2 12/04/2011 0625   MCHC 34.0 12/04/2011 0625   RDW 13.7 12/04/2011 0625     Assessment:  1. ESRD 2. Sp Fx Lt ankle 3. Anemia 4. HTN  Plan: 1. HD in AM 2. Incentive spirometer   Shenee Wignall T

## 2011-12-04 NOTE — Progress Notes (Signed)
PT Progress Note:     12/04/11 1400  PT Visit Information  Last PT Received On 12/04/11  Assistance Needed +2 (+2 for safety to follow with recliner)  PT Time Calculation  PT Start Time 1200  PT Stop Time 1220  PT Time Calculation (min) 20 min  Precautions  Precautions Fall  Restrictions  Weight Bearing Restrictions Yes  LLE Weight Bearing NWB  Cognition  Overall Cognitive Status Appears within functional limits for tasks assessed/performed  Arousal/Alertness Awake/alert  Orientation Level Appears intact for tasks assessed  Behavior During Session [facility] for tasks performed  Bed Mobility  Bed Mobility Supine to Sit;Sitting - Scoot to Edge of Bed  Supine to Sit HOB flat;4: Min assist  Sitting - Scoot to [place] of Bed 4: Min guard  Details for Bed Mobility Assistance Pt able to manage LLE I'ly, but required min (A) to lift shoulders/trunk to sitting upright.  Cues for technique.    Transfers  Transfers Sit to Stand;Stand to Sit  Sit to Stand 4: Min assist;With upper extremity assist;From bed  Stand to Sit 4: Min assist;With upper extremity assist;With armrests;To chair/3-in-1  Details for Transfer Assistance Cues for hand placement & technique.  Pt does well with maintaining NWBing LLE.  (A) for balance, controlled descent, & safety.    Ambulation/Gait  Ambulation/Gait Assistance 4: Min assist (+2 to follow with recliner)  Ambulation Distance (Feet) 10 Feet  Assistive device Rolling walker  Ambulation/Gait Assistance Details (A) for balance.  Cues for technique.  Pt does well with maintaining NWBing LLE.  Pt c/o "burning" in Lt foot.    Gait Pattern Step-to pattern  Stairs No  Wheelchair Mobility  Wheelchair Mobility No  Balance  Balance Assessed No  PT - End of Session  Equipment Utilized During Treatment Gait belt  Activity Tolerance Patient tolerated treatment well  Patient left in chair;with call bell/phone within reach;with family/visitor present  Nurse Communication  Mobility status  PT - Assessment/Plan  Comments on Treatment Session Pt pleasant & willing to participate.  Progressing with PT goals.  Able to ambulate today.   Pt states he is unsure if he will return home before or after next surgery.    PT Plan Discharge plan remains appropriate  PT Frequency Min 5X/week  Follow Up Recommendations Home health PT;Supervision/Assistance - 24 hour  Equipment Recommended Rolling walker with 5" wheels;Wheelchair (measurements) (wide RW)  Acute Rehab PT Goals  PT Goal Formulation With patient  Time For Goal Achievement 12/10/11  Potential to Achieve Goals Fair  PT Goal: Supine/Side to Sit - Progress Progressing toward goal  PT Goal: Sit to Stand - Progress Progressing toward goal  PT Goal: Stand to Sit - Progress Progressing toward goal  PT Goal: Ambulate - Progress Progressing toward goal     Verdell Face, PTA [phone] 12/04/2011

## 2011-12-04 NOTE — Consult Note (Signed)
[facility] Consultation  Reason for Consult: left pilon fracture s/p ex-fix Referring Physician: Marcene Corning, MD  [name] [name] is an 77 y.o. male.  HPI: ESRD patient s/p syndesmotic screws for ankle frx several months ago with new fall after returning to [facility] without assistive devices over the past few weeks; going down in a ditch when heard a pop and ankle gave way with subsequent inability to WB; pain well controlled, dialysis expected tomorrow (Tu,Th,Sat); c/o difficulty swallowing solid food  Past Medical History  Diagnosis Date  . Renal cell cancer     Remote. S/p right nephrectomy.   . ESRD on dialysis     Left fistula   . Ankle fracture     Left ~08/2011. [place] fracture  . DM (diabetes mellitus)     Per pt, borderline     No past surgical history on file.  No family history on file.  Social History:  does not have a smoking history on file. He does not have any smokeless tobacco history on file. His alcohol and drug histories not on file.  Allergies:  Allergies  Allergen Reactions  . Ivp Dye (Iodinated Diagnostic Agents)     Unknown  . Ofloxacin Nausea And Vomiting  . Oxycodone     Altered mental status  . Sulfa Antibiotics Rash  . Sulfamethoxazole W-Trimethoprim Rash    Medications: I have reviewed the patient's current medications.  Results for orders placed during the hospital encounter of 12/01/11 (from the past 48 hour(s))  WOUND CULTURE     Status: Normal   Collection Time   12/02/11  4:39 PM      Component Value Range Comment   Specimen Description WOUND LEFT LEG      Special Requests NONE      Gram Stain        Value: FEW WBC PRESENT, PREDOMINANTLY PMN     NO SQUAMOUS EPITHELIAL CELLS SEEN     NO ORGANISMS SEEN   Culture NO GROWTH 2 DAYS      Report Status 12/04/2011 FINAL     ANAEROBIC CULTURE     Status: Normal (Preliminary result)   Collection Time   12/02/11  4:39 PM      Component Value Range Comment   Specimen  Description WOUND LEFT LEG      Special Requests NONE      Gram Stain        Value: FEW WBC PRESENT, PREDOMINANTLY PMN     NO SQUAMOUS EPITHELIAL CELLS SEEN     NO ORGANISMS SEEN   Culture        Value: NO ANAEROBES ISOLATED; CULTURE IN PROGRESS FOR 5 DAYS   Report Status PENDING     GLUCOSE, CAPILLARY     Status: Abnormal   Collection Time   12/02/11  8:42 PM      Component Value Range Comment   Glucose-Capillary 117 (*) 70 - 99 (mg/dL)   GLUCOSE, CAPILLARY     Status: Abnormal   Collection Time   12/03/11  6:34 AM      Component Value Range Comment   Glucose-Capillary 100 (*) 70 - 99 (mg/dL)   GLUCOSE, CAPILLARY     Status: Abnormal   Collection Time   12/03/11  8:53 PM      Component Value Range Comment   Glucose-Capillary 103 (*) 70 - 99 (mg/dL)   GLUCOSE, CAPILLARY     Status: Abnormal   Collection Time   12/04/11  6:01 AM  Component Value Range Comment   Glucose-Capillary 117 (*) 70 - 99 (mg/dL)   RENAL FUNCTION PANEL     Status: Abnormal   Collection Time   12/04/11  6:25 AM      Component Value Range Comment   Sodium 134 (*) 135 - 145 (mEq/L)    Potassium 3.9  3.5 - 5.1 (mEq/L)    Chloride 94 (*) 96 - 112 (mEq/L)    CO2 26  19 - 32 (mEq/L)    Glucose, Bld 114 (*) 70 - 99 (mg/dL)    BUN 48 (*) 6 - 23 (mg/dL)    Creatinine, Ser 1.61 (*) 0.50 - 1.35 (mg/dL)    Calcium 7.7 (*) 8.4 - 10.5 (mg/dL)    Phosphorus 5.5 (*) 2.3 - 4.6 (mg/dL)    Albumin 2.4 (*) 3.5 - 5.2 (g/dL)    GFR calc non Af Amer 7 (*) >90 (mL/min)    GFR calc Af Amer 8 (*) >90 (mL/min)   CBC     Status: Abnormal   Collection Time   12/04/11  6:25 AM      Component Value Range Comment   WBC 8.0  4.0 - 10.5 (K/uL)    RBC 3.16 (*) 4.22 - 5.81 (MIL/uL)    Hemoglobin 10.5 (*) 13.0 - 17.0 (g/dL)    HCT 09.6 (*) 04.5 - 52.0 (%)    MCV 97.8  78.0 - 100.0 (fL)    MCH 33.2  26.0 - 34.0 (pg)    MCHC 34.0  30.0 - 36.0 (g/dL)    RDW 40.9  81.1 - 91.4 (%)    Platelets 147 (*) 150 - 400 (K/uL)     Dg Ankle 2  Views Left  12/02/2011  *RADIOLOGY REPORT*  Clinical Data: 77 year old male undergoing left ankle external fixation.  LEFT ANKLE - 2 VIEW  Comparison: 12/01/2011 and earlier.  Fluoroscopy time of 0.5 minutes was utilized.  Findings: Three intraoperative fluoroscopic views of the left ankle.  Previously seen cannulated screws have been removed.  The latter two images demonstrate external fixator hardware placement probably through the calcaneus.  IMPRESSION: Left ankle ORIF and external fixation hardware revision as above.  Original Report Authenticated By: Harley Hallmark, M.D.   Blood pressure 130/74, pulse 76, temperature 98.3 F (36.8 C), temperature source Oral, resp. rate 16, height 5\' 8"  (1.727 m), weight 220 lb 3.8 oz (99.9 kg), SpO2 96.00%.  ROS notable for stocking/ glove neuropathy, dysphagia, renal disease, dm, recent frx  In general older than stated age, truncal obesity, no distress; fair complexion, red headed No wheezing RRR abd soft, nontender, +bs LLE pin sites clean, scant drainage, lateral incision C/D/I DP 2+, decreased sensation, great and lessor toe flexion intact grossly, skin does not wrinkle easily in anticipated area of incision but swelling not severe  Assessment/Plan: Excellent alignment s/p ex-fix; osteoporsis, dysphagia, neuropathy; marginal soft tissue swelling  1. Swallowing study 2. Dialysis tomorrow 3. Surgery may be feasible late this week, but will have to re-eval soft tissues tom or Wed am; will discuss with Dr. Sabino Donovan, MD [facility]  12/04/2011  8:18 AM

## 2011-12-04 NOTE — Progress Notes (Signed)
Subjective: Patient complains of some continued ankle pain this morning.   He also complains of increased reflux symptoms and a sensation of food being stuck since he had the procedure.  Objective: Weight change: -2.1 kg (-4 lb 10.1 oz)  Intake/Output Summary (Last 24 hours) at 12/03/11 0925 Last data filed at 12/02/11 1705  Gross per 24 hour  Intake   1200 ml  Output   1700 ml  Net   -500 ml    Filed Vitals:   12/03/11 0449  BP: 143/74  Pulse: 73  Temp: 97.8 F (36.6 C)  Resp: 16   General: Patient does not seem to be in any acute distress. Cardiovascular: Regular rate rhythm Lungs: Clear to auscultation bilaterally. Abdomen: Positive bowel sounds Extremities: Left lower extremity wrapped secondary to surgery.  Lab Results: Reviewed  Micro Results: Recent Results (from the past 240 hour(s))  WOUND CULTURE     Status: Normal (Preliminary result)   Collection Time   12/02/11  4:39 PM      Component Value Range Status Comment   Specimen Description WOUND LEFT LEG   Final    Special Requests NONE   Final    Gram Stain PENDING   Incomplete    Culture NO GROWTH 1 DAY   Final    Report Status PENDING   Incomplete     Studies/Results: Dg Ankle 2 Views Left  12/02/2011  *RADIOLOGY REPORT*  Clinical Data: 77 year old male undergoing left ankle external fixation.  LEFT ANKLE - 2 VIEW  Comparison: 12/01/2011 and earlier.  Fluoroscopy time of 0.5 minutes was utilized.  Findings: Three intraoperative fluoroscopic views of the left ankle.  Previously seen cannulated screws have been removed.  The latter two images demonstrate external fixator hardware placement probably through the calcaneus.  IMPRESSION: Left ankle ORIF and external fixation hardware revision as above.  Original Report Authenticated By: Harley Hallmark, M.D.   Portable Chest 1 View  12/02/2011  *RADIOLOGY REPORT*  Clinical Data: Preop left ankle injury  PORTABLE CHEST - 1 VIEW  Comparison: None.  Findings: Stable  enlarged cardiac silhouette with ectatic aorta. No effusion, infiltrate, or pneumothorax. No acute osseous abnormality.  IMPRESSION: Mild cardiomegaly without acute cardiopulmonary findings.  Original Report Authenticated By: Genevive Bi, M.D.   Medications: Scheduled Meds:   .  ceFAZolin (ANCEF) IV  1 g Intravenous Q24H  . docusate sodium  100 mg Oral BID  . heparin  5,000 Units Subcutaneous Q8H  . metoprolol  25 mg Oral Daily  . multivitamin  1 tablet Oral QHS  . pantoprazole  20 mg Oral Q1200  . PARoxetine  30 mg Oral Daily  . senna  1 tablet Oral BID  . sevelamer  1,600 mg Oral TID WC  . DISCONTD: allopurinol  200 mg Oral Daily  . DISCONTD: calcium acetate  1,334 mg Oral TID WC  . DISCONTD:  ceFAZolin (ANCEF) IV  1 g Intravenous Q8H  . DISCONTD: sodium chloride  3 mL Intravenous Q12H   Continuous Infusions:  PRN Meds:.sodium chloride, sodium chloride, feeding supplement (NEPRO CARB STEADY), fentaNYL, gabapentin, heparin, HYDROcodone-acetaminophen, lidocaine, lidocaine-prilocaine, metoCLOPramide (REGLAN) injection, metoCLOPramide, morphine injection, ondansetron (ZOFRAN) IV, ondansetron, pentafluoroprop-tetrafluoroeth, polyethylene glycol, DISCONTD: sodium chloride, DISCONTD: acetaminophen, DISCONTD: acetaminophen, DISCONTD: alteplase DISCONTD: ondansetron (ZOFRAN) IV, DISCONTD: ondansetron, DISCONTD: sodium chloride, DISCONTD: sodium chloride irrigation  Assessment/Plan:  DM (diabetes mellitus) ()    blood sugars are controlled. Will continue to monitor.   Ankle fracture () Patient did well with surgery. Followed per  orthopedics.     ESRD on dialysis () Patient had hemodialysis yesterday. Follow up per nephrology.    Neuropathy (12/03/2011) gabapentin .  Dysphagia Speech eval per ortho  Disposition:  orthopedic recommendations noted.      LOS: 2 days   [name] [name] 12/03/2011, 9:25 AM Pager [phone]

## 2011-12-05 ENCOUNTER — Inpatient Hospital Stay (HOSPITAL_COMMUNITY): Payer: Medicare PPO

## 2011-12-05 DIAGNOSIS — N186 End stage renal disease: Secondary | ICD-10-CM

## 2011-12-05 DIAGNOSIS — S82409A Unspecified fracture of shaft of unspecified fibula, initial encounter for closed fracture: Secondary | ICD-10-CM

## 2011-12-05 DIAGNOSIS — I10 Essential (primary) hypertension: Secondary | ICD-10-CM

## 2011-12-05 DIAGNOSIS — S82209A Unspecified fracture of shaft of unspecified tibia, initial encounter for closed fracture: Secondary | ICD-10-CM

## 2011-12-05 LAB — URINALYSIS, ROUTINE W REFLEX MICROSCOPIC
Ketones, ur: NEGATIVE mg/dL
Nitrite: NEGATIVE
pH: 7.5 (ref 5.0–8.0)

## 2011-12-05 LAB — RENAL FUNCTION PANEL
Albumin: 2.5 g/dL — ABNORMAL LOW (ref 3.5–5.2)
BUN: 54 mg/dL — ABNORMAL HIGH (ref 6–23)
Calcium: 8 mg/dL — ABNORMAL LOW (ref 8.4–10.5)
Glucose, Bld: 99 mg/dL (ref 70–99)
Phosphorus: 5.1 mg/dL — ABNORMAL HIGH (ref 2.3–4.6)
Sodium: 132 mEq/L — ABNORMAL LOW (ref 135–145)

## 2011-12-05 LAB — CBC
HCT: 30.8 % — ABNORMAL LOW (ref 39.0–52.0)
MCHC: 34.4 g/dL (ref 30.0–36.0)
MCV: 96.6 fL (ref 78.0–100.0)
Platelets: 166 10*3/uL (ref 150–400)
RDW: 13.5 % (ref 11.5–15.5)

## 2011-12-05 LAB — URINE MICROSCOPIC-ADD ON

## 2011-12-05 MED ORDER — HEPARIN SODIUM (PORCINE) 1000 UNIT/ML IJ SOLN
2000.0000 [IU] | Freq: Once | INTRAMUSCULAR | Status: AC
  Administered 2011-12-05: 2000 [IU] via INTRAVENOUS
  Filled 2011-12-05: qty 2

## 2011-12-05 MED ORDER — HYDROCODONE-ACETAMINOPHEN 5-325 MG PO TABS
ORAL_TABLET | ORAL | Status: AC
  Administered 2011-12-05: 2 via ORAL
  Filled 2011-12-05: qty 2

## 2011-12-05 MED ORDER — METOCLOPRAMIDE HCL 5 MG/ML IJ SOLN
5.0000 mg | Freq: Two times a day (BID) | INTRAMUSCULAR | Status: AC
  Administered 2011-12-05 – 2011-12-06 (×3): 5 mg via INTRAVENOUS
  Filled 2011-12-05 (×2): qty 1
  Filled 2011-12-05: qty 2

## 2011-12-05 MED ORDER — DARBEPOETIN ALFA-POLYSORBATE 25 MCG/0.42ML IJ SOLN
INTRAMUSCULAR | Status: AC
  Administered 2011-12-05: 25 ug via INTRAVENOUS
  Filled 2011-12-05: qty 0.42

## 2011-12-05 MED ORDER — BISACODYL 10 MG RE SUPP
10.0000 mg | Freq: Every day | RECTAL | Status: DC | PRN
  Administered 2011-12-05: 10 mg via RECTAL
  Filled 2011-12-05: qty 1

## 2011-12-05 MED ORDER — DARBEPOETIN ALFA-POLYSORBATE 25 MCG/0.42ML IJ SOLN
25.0000 ug | INTRAMUSCULAR | Status: DC
  Administered 2011-12-05: 25 ug via INTRAVENOUS
  Filled 2011-12-05: qty 0.42

## 2011-12-05 MED ORDER — NEPRO/CARBSTEADY PO LIQD
237.0000 mL | Freq: Two times a day (BID) | ORAL | Status: DC
  Administered 2011-12-05 – 2011-12-09 (×6): 237 mL via ORAL
  Filled 2011-12-05 (×12): qty 237

## 2011-12-05 NOTE — Progress Notes (Signed)
Inpatient Diabetes Program Recommendations  AACE/ADA: New Consensus Statement on Inpatient Glycemic Control (2009)  Target Ranges:  Prepandial:   less than 140 mg/dL      Peak postprandial:   less than 180 mg/dL (1-2 hours)      Critically ill patients:  140 - 180 mg/dL   Reason for Visit:Hyperglycemia  Results for [name] [name], [name] [name] (MRN [id number]) as of 12/05/2011 14:56  Ref. Range 12/02/2011 06:20 12/04/2011 06:25 12/05/2011 05:26  Glucose Latest Range: 70-99 mg/dL 409 (H) 811 (H) 99  Results for [name] [name], [name] [name] (MRN [id number]) as of 12/05/2011 14:56  Ref. Range 12/02/2011 20:42 12/02/2011 21:46 12/03/2011 06:34 12/03/2011 20:53 12/04/2011 06:01 12/04/2011 16:05 12/04/2011 21:54 12/05/2011 06:49  Glucose-Capillary Latest Range: 70-99 mg/dL 213 (H) 086 (H) 578 (H) 103 (H) 117 (H) 90 96 97    Inpatient Diabetes Program Recommendations HgbA1C: Check HgbA1C to assess glycemic control prior to hospitalization  Note: Will follow.

## 2011-12-05 NOTE — Progress Notes (Signed)
Pt reported voiding frequently since yesterday.   He said he has not been able to urinate since his hemodialysis treatment.  Bladder scan showed 331 in bladder.   Pt was able to void 50 cc.  Dr. Earlene Plater said to do I&O cath and to send ua to lab.  I&O cath completed and 50 cc urine emptied from bladder.  Will continue to monitor.

## 2011-12-05 NOTE — Progress Notes (Signed)
Utilization review completed. Gabrian Hoque, RN, BSN. 12/05/11  

## 2011-12-05 NOTE — Procedures (Signed)
Pt seen on HD.  Ap 160 170  SBP 133. Tolerating HD well so far.

## 2011-12-05 NOTE — Progress Notes (Signed)
Subjective: Doing well this am Working with therapy  Ambulating in room, appears to be doing well No significant complaints today Denies CP, no sob No BM since friday   Objective: Vital signs in last 24 hours: Temp:  [97.1 F (36.2 C)-98 F (36.7 C)] 97.1 F (36.2 C) (06/04 0658) Pulse Rate:  [77-85] 82  (06/04 0658) Resp:  [18-20] 18  (06/04 0658) BP: (120-132)/(60-74) 128/74 mmHg (06/04 0658) SpO2:  [97 %-100 %] 97 % (06/04 0658) Weight:  [99.7 kg (219 lb 12.8 oz)] 99.7 kg (219 lb 12.8 oz) (06/04 0700)  Intake/Output from previous day: 06/03 0701 - 06/04 0700 In: 240 [P.O.:240] Out: 200 [Urine:200] Intake/Output      06/03 0701 - 06/04 0700 06/04 0701 - 06/05 0700   P.O. 240    Total Intake(mL/kg) 240 (2.4)    Urine (mL/kg/hr) 200 (0.1)    Total Output 200    Net +40           Intake/Output this shift:     Basename 12/05/11 0526 12/04/11 0625  HGB 10.6* 10.5*    Basename 12/05/11 0526 12/04/11 0625  WBC 6.5 8.0  RBC 3.19* 3.16*  HCT 30.8* 30.9*  PLT 166 147*    Basename 12/05/11 0526 12/04/11 0625  NA 132* 134*  K 4.5 3.9  CL 92* 94*  CO2 23 26  BUN 54* 48*  CREATININE 7.53* 7.09*  GLUCOSE 99 114*  CALCIUM 8.0* 7.7*   No results found for this basename: LABPT:2,INR:2 in the last 72 hours  Phyical Exam  ZOX:WRUEAVW well, very pleasant, NAD Lungs:Clear anterior fields Cardiac:s1 and s2 Abd:+ BS but sound somewhat diminished UJW:JXBJ Lower Extremity  Ex fix stable  pinsites look very good, clean  Baseline peripheral neuropathy  Swelling stable  Skin wrinkles with gentle compression  Ext is warm  Assessment/Plan:  77 y/o male s/p fall with L pilon fracture complicated by several medical comorbidities  1. Fall 2. L pilon fracture s/p ex fix  NWB   Continue with fixator  Daily pinsite care   Continue with PT/OT  Ice and elevate prn 3. Diabetic peripheral neuropathy  On gabapentin  ? If component of diabetic gastroparesis present  which is contributing to dec bowel function.  Will add low dose reglan (5 mg iv bid x 48 hours) 4. DM  Good control  Continue to monitor 5. Metabolic bone disease  Likely contributing factor to poor bone quality in setting of ESRD  Will check basic labs to eval for additional correctable factors 6. DVT/PE prophylaxis  Heparin per primary team 7. FEN  Hyponatremia  Diet as tolerated  Advance bowel regimen 8. Dispo  Plan for OR Thursday am, pt posted, 1st case. Plan for dialysis after surgery   Mearl Latin, PA-C [facility] [phone] (P) 12/05/2011, 9:19 AM

## 2011-12-05 NOTE — Progress Notes (Signed)
Physical Therapy Treatment Patient Details Name: [name] [name] MRN: [id number] DOB: [birth year] Today's Date: 12/05/2011 Time: 3086-5784 PT Time Calculation (min): 20 min  PT Assessment / Plan / Recommendation Comments on Treatment Session  Pt progressing with PT goals.  Increased ambulation distance today.  Per ortho PA progress note, pt is scheduled to return to OR on thursday.  Will continue to follow pt.    Pt needs to begin w/c education & stair training.      Follow Up Recommendations  Home health PT;Supervision/Assistance - 24 hour    Barriers to Discharge        Equipment Recommendations  Rolling walker with 5" wheels;Wheelchair (measurements)    Recommendations for Other Services    Frequency Min 5X/week   Plan Discharge plan remains appropriate    Precautions / Restrictions Precautions Precautions: Fall Restrictions Weight Bearing Restrictions: Yes LLE Weight Bearing: Non weight bearing       Mobility  Bed Mobility Bed Mobility: Supine to Sit;Sitting - Scoot to Edge of Bed Supine to Sit: HOB flat;5: Supervision Sitting - Scoot to Edge of Bed: 5: Supervision Details for Bed Mobility Assistance: Increased time to transition.  No physical (A) required today.  Transfers Transfers: Sit to Stand;Stand to Sit Sit to Stand: 4: Min assist;With upper extremity assist;From bed Stand to Sit: 4: Min assist;With upper extremity assist;With armrests;To chair/3-in-1 Details for Transfer Assistance: Cues for safest hand placement; pt did well with maintaining NWBing with sit>stand but required cues to enforce precaution with stand>sit.  (A) for balance & controlled descent to recliner.   Ambulation/Gait Ambulation/Gait Assistance: 4: Min assist Ambulation Distance (Feet): 20 Feet Assistive device: Rolling walker Ambulation/Gait Assistance Details: (A) for balance & safety.  Cues for technique especially with turning, & to ensure balance before taking next hop.   Continues to do  well with maintaining NWBing.   Gait Pattern: Step-to pattern Stairs: No Wheelchair Mobility Wheelchair Mobility: No    Exercises     PT Diagnosis:    PT Problem List:   PT Treatment Interventions:     PT Goals Acute Rehab PT Goals Time For Goal Achievement: 12/10/11 Potential to Achieve Goals: Fair PT Goal: Supine/Side to Sit - Progress: Progressing toward goal PT Goal: Sit to Stand - Progress: Progressing toward goal PT Goal: Stand to Sit - Progress: Progressing toward goal PT Goal: Ambulate - Progress: Progressing toward goal  Visit Information  Last PT Received On: 12/05/11 Assistance Needed: +1    Subjective Data      Cognition  Overall Cognitive Status: Appears within functional limits for tasks assessed/performed Arousal/Alertness: Awake/alert Orientation Level: Appears intact for tasks assessed Behavior During Session: [facility] for tasks performed    Balance  Balance Balance Assessed: No  End of Session PT - End of Session Equipment Utilized During Treatment: Gait belt Activity Tolerance: Patient tolerated treatment well Patient left: in chair;with call bell/phone within reach;with family/visitor present    Lara Mulch 12/05/2011, 2:08 PM   Verdell Face, PTA [phone] 12/05/2011

## 2011-12-05 NOTE — Progress Notes (Signed)
S:pain controlled.  Bowels still not moving despite laxatives.  Appetite poor O:BP 128/74  Pulse 82  Temp(Src) 97.1 F (36.2 C) (Oral)  Resp 18  Ht 5\' 8"  (1.727 m)  Wt 99.7 kg (219 lb 12.8 oz)  BMI 33.42 kg/m2  SpO2 97%  Intake/Output Summary (Last 24 hours) at 12/05/11 0914 Last data filed at 12/04/11 1700  Gross per 24 hour  Intake    240 ml  Output    200 ml  Net     40 ml   Weight change: -0.2 kg (-7.1 oz) ZOX:WRUEA and alert CVS:RRR Resp:clear ant Abd:+ BS NTND Ext:no edema.  Lt leg ext pinned  Lt AVF + bruit NEURO:CNI Ox3      .  ceFAZolin (ANCEF) IV  1 g Intravenous Q24H  . darbepoetin (ARANESP) injection - DIALYSIS  25 mcg Intravenous Q Tue-HD  . docusate sodium  100 mg Oral BID  . gabapentin  300 mg Oral QHS  . heparin  5,000 Units Subcutaneous Q8H  . lansoprazole  15 mg Oral Q1200  . metoprolol  25 mg Oral Daily  . multivitamin  1 tablet Oral QHS  . PARoxetine  30 mg Oral Daily  . polyethylene glycol  17 g Oral BID  . senna  1 tablet Oral BID  . sevelamer  1,600 mg Oral TID WC   No results found. BMET    Component Value Date/Time   NA 132* 12/05/2011 0526   K 4.5 12/05/2011 0526   CL 92* 12/05/2011 0526   CO2 23 12/05/2011 0526   GLUCOSE 99 12/05/2011 0526   BUN 54* 12/05/2011 0526   CREATININE 7.53* 12/05/2011 0526   CALCIUM 8.0* 12/05/2011 0526   GFRNONAA 6* 12/05/2011 0526   GFRAA 7* 12/05/2011 0526   CBC    Component Value Date/Time   WBC 6.5 12/05/2011 0526   RBC 3.19* 12/05/2011 0526   HGB 10.6* 12/05/2011 0526   HCT 30.8* 12/05/2011 0526   PLT 166 12/05/2011 0526   MCV 96.6 12/05/2011 0526   MCH 33.2 12/05/2011 0526   MCHC 34.4 12/05/2011 0526   RDW 13.5 12/05/2011 0526     Assessment:  1. ESRD 2. Sp Fx Lt ankle 3. Anemia 4. HTN  Plan: 1. HD today 2.  He agrees to try nepro 3. ? Surgery on thursday  Channie Bostick T

## 2011-12-05 NOTE — Progress Notes (Signed)
Brief narrative Patient is a 77 year old white male that came in with ankle fracture. He was taken to the OR the day after admission. The surgeon will be taking him back to the OR on Thursday. All other medical conditions are stable. He is awaiting surgery.   Subjective: Patient complains of some continued ankle pain this morning.   His dysphagia has resolved. He continues to complain of not having any taste for the past 3 months.  Objective: Weight change: -2.1 kg (-4 lb 10.1 oz)  Intake/Output Summary (Last 24 hours) at 12/03/11 0925 Last data filed at 12/02/11 1705  Gross per 24 hour  Intake   1200 ml  Output   1700 ml  Net   -500 ml    Filed Vitals:   12/03/11 0449  BP: 143/74  Pulse: 73  Temp: 97.8 F (36.6 C)  Resp: 16   General: Patient does not seem to be in any acute distress. Cardiovascular: Regular rate rhythm Lungs: Clear to auscultation bilaterally. Abdomen: Positive bowel sounds Extremities: Left lower extremity wrapped secondary to surgery.  Lab Results: Reviewed  Micro Results: Recent Results (from the past 240 hour(s))  WOUND CULTURE     Status: Normal (Preliminary result)   Collection Time   12/02/11  4:39 PM      Component Value Range Status Comment   Specimen Description WOUND LEFT LEG   Final    Special Requests NONE   Final    Gram Stain PENDING   Incomplete    Culture NO GROWTH 1 DAY   Final    Report Status PENDING   Incomplete     Studies/Results: Dg Ankle 2 Views Left  12/02/2011  *RADIOLOGY REPORT*  Clinical Data: 77 year old male undergoing left ankle external fixation.  LEFT ANKLE - 2 VIEW  Comparison: 12/01/2011 and earlier.  Fluoroscopy time of 0.5 minutes was utilized.  Findings: Three intraoperative fluoroscopic views of the left ankle.  Previously seen cannulated screws have been removed.  The latter two images demonstrate external fixator hardware placement probably through the calcaneus.  IMPRESSION: Left ankle ORIF and external  fixation hardware revision as above.  Original Report Authenticated By: Harley Hallmark, M.D.   Portable Chest 1 View  12/02/2011  *RADIOLOGY REPORT*  Clinical Data: Preop left ankle injury  PORTABLE CHEST - 1 VIEW  Comparison: None.  Findings: Stable enlarged cardiac silhouette with ectatic aorta. No effusion, infiltrate, or pneumothorax. No acute osseous abnormality.  IMPRESSION: Mild cardiomegaly without acute cardiopulmonary findings.  Original Report Authenticated By: Genevive Bi, M.D.   Medications: Scheduled Meds:   .  ceFAZolin (ANCEF) IV  1 g Intravenous Q24H  . docusate sodium  100 mg Oral BID  . heparin  5,000 Units Subcutaneous Q8H  . metoprolol  25 mg Oral Daily  . multivitamin  1 tablet Oral QHS  . pantoprazole  20 mg Oral Q1200  . PARoxetine  30 mg Oral Daily  . senna  1 tablet Oral BID  . sevelamer  1,600 mg Oral TID WC  . DISCONTD: allopurinol  200 mg Oral Daily  . DISCONTD: calcium acetate  1,334 mg Oral TID WC  . DISCONTD:  ceFAZolin (ANCEF) IV  1 g Intravenous Q8H  . DISCONTD: sodium chloride  3 mL Intravenous Q12H   Continuous Infusions:  PRN Meds:.sodium chloride, sodium chloride, feeding supplement (NEPRO CARB STEADY), fentaNYL, gabapentin, heparin, HYDROcodone-acetaminophen, lidocaine, lidocaine-prilocaine, metoCLOPramide (REGLAN) injection, metoCLOPramide, morphine injection, ondansetron (ZOFRAN) IV, ondansetron, pentafluoroprop-tetrafluoroeth, polyethylene glycol, DISCONTD: sodium chloride, DISCONTD: acetaminophen,  DISCONTD: acetaminophen, DISCONTD: alteplase DISCONTD: ondansetron (ZOFRAN) IV, DISCONTD: ondansetron, DISCONTD: sodium chloride, DISCONTD: sodium chloride irrigation  Assessment/Plan:  DM (diabetes mellitus) ()  Blood sugars are controlled. Will continue to monitor.   Ankle fracture () Patient did well with surgery. Followed per orthopedics. Patient to go to the OR on Thursday.     ESRD on dialysis () Patient had hemodialysis MWF. Followed by  nephrology.    Neuropathy (12/03/2011) Gabapentin .  Dysphagia Most likely related to intubation during surgery. That has resolved.  Disposition:   Orthopedic recommendations noted.        LOS: 2 days   Marykay Mccleod JARRETT 12/05/2011, 9:25 AM Pager [phone]

## 2011-12-06 DIAGNOSIS — S82209A Unspecified fracture of shaft of unspecified tibia, initial encounter for closed fracture: Secondary | ICD-10-CM

## 2011-12-06 DIAGNOSIS — N186 End stage renal disease: Secondary | ICD-10-CM

## 2011-12-06 DIAGNOSIS — I10 Essential (primary) hypertension: Secondary | ICD-10-CM

## 2011-12-06 DIAGNOSIS — S82409A Unspecified fracture of shaft of unspecified fibula, initial encounter for closed fracture: Secondary | ICD-10-CM

## 2011-12-06 LAB — RENAL FUNCTION PANEL
CO2: 27 mEq/L (ref 19–32)
Calcium: 8.6 mg/dL (ref 8.4–10.5)
GFR calc Af Amer: 11 mL/min — ABNORMAL LOW (ref >90)
Glucose, Bld: 110 mg/dL — ABNORMAL HIGH (ref 70–99)
Potassium: 3.8 mEq/L (ref 3.5–5.1)
Sodium: 134 mEq/L — ABNORMAL LOW (ref 135–145)

## 2011-12-06 LAB — GLUCOSE, CAPILLARY
Glucose-Capillary: 131 mg/dL — ABNORMAL HIGH (ref 70–99)
Glucose-Capillary: 94 mg/dL (ref 70–99)
Glucose-Capillary: 142 mg/dL — ABNORMAL HIGH (ref 70–99)

## 2011-12-06 LAB — PREALBUMIN: Prealbumin: 15.8 mg/dL — ABNORMAL LOW (ref 17.0–34.0)

## 2011-12-06 MED ORDER — MAGIC MOUTHWASH
5.0000 mL | Freq: Three times a day (TID) | ORAL | Status: AC
  Administered 2011-12-06 – 2011-12-08 (×8): 5 mL via ORAL
  Filled 2011-12-06 (×9): qty 5

## 2011-12-06 MED ORDER — MENTHOL 3 MG MT LOZG: 1.0000 | LOZENGE | OROMUCOSAL | Status: DC | PRN

## 2011-12-06 MED ORDER — TAMSULOSIN HCL 0.4 MG PO CAPS
0.4000 mg | ORAL_CAPSULE | Freq: Every day | ORAL | Status: DC
  Administered 2011-12-06 – 2011-12-09 (×4): 0.4 mg via ORAL
  Filled 2011-12-06 (×4): qty 1

## 2011-12-06 MED ORDER — HEPARIN SODIUM (PORCINE) 5000 UNIT/ML IJ SOLN
5000.0000 [IU] | Freq: Two times a day (BID) | INTRAMUSCULAR | Status: DC
  Administered 2011-12-06 – 2011-12-09 (×5): 5000 [IU] via SUBCUTANEOUS
  Filled 2011-12-06 (×8): qty 1

## 2011-12-06 MED ORDER — CEFAZOLIN SODIUM 1-5 GM-% IV SOLN
1.0000 g | INTRAVENOUS | Status: AC
  Administered 2011-12-07: 1 g via INTRAVENOUS
  Filled 2011-12-06: qty 50

## 2011-12-06 NOTE — Progress Notes (Signed)
S:pain controlled.  Had a bowel movement yest.  Some problems with urinary retention since he is restricted to bed O:BP 147/57  Pulse 67  Temp(Src) 98.6 F (37 C) (Oral)  Resp 18  Ht 5\' 8"  (1.727 m)  Wt 95.7 kg (210 lb 15.7 oz)  BMI 32.08 kg/m2  SpO2 97%  Intake/Output Summary (Last 24 hours) at 12/06/11 0845 Last data filed at 12/06/11 0500  Gross per 24 hour  Intake    600 ml  Output   2850 ml  Net  -2250 ml   Weight change: -0.9 kg (-1 lb 15.8 oz) ZOX:WRUEA and alert CVS:RRR Resp:clear ant Abd:+ BS NTND Ext:no edema.  Lt leg ext pinned  Lt AVF + bruit NEURO:CNI Ox3      .  ceFAZolin (ANCEF) IV  1 g Intravenous On Call  . darbepoetin (ARANESP) injection - DIALYSIS  25 mcg Intravenous Q Tue-HD  . docusate sodium  100 mg Oral BID  . feeding supplement (NEPRO CARB STEADY)  237 mL Oral BID BM  . gabapentin  300 mg Oral QHS  . heparin  2,000 Units Intravenous Once  . heparin  5,000 Units Subcutaneous Q12H  . lansoprazole  15 mg Oral Q1200  . magic mouthwash  5 mL Oral TID  . metoCLOPramide (REGLAN) injection  5 mg Intravenous Q12H  . metoprolol  25 mg Oral Daily  . multivitamin  1 tablet Oral QHS  . PARoxetine  30 mg Oral Daily  . polyethylene glycol  17 g Oral BID  . senna  1 tablet Oral BID  . sevelamer  1,600 mg Oral TID WC  . DISCONTD:  ceFAZolin (ANCEF) IV  1 g Intravenous Q24H  . DISCONTD: heparin  5,000 Units Subcutaneous Q8H   No results found. BMET    Component Value Date/Time   NA 134* 12/06/2011 0605   K 3.8 12/06/2011 0605   CL 94* 12/06/2011 0605   CO2 27 12/06/2011 0605   GLUCOSE 110* 12/06/2011 0605   BUN 32* 12/06/2011 0605   CREATININE 5.36* 12/06/2011 0605   CALCIUM 8.6 12/06/2011 0605   GFRNONAA 9* 12/06/2011 0605   GFRAA 11* 12/06/2011 0605   CBC    Component Value Date/Time   WBC 6.5 12/05/2011 0526   RBC 3.19* 12/05/2011 0526   HGB 10.6* 12/05/2011 0526   HCT 30.8* 12/05/2011 0526   PLT 166 12/05/2011 0526   MCV 96.6 12/05/2011 0526   MCH 33.2 12/05/2011 0526     MCHC 34.4 12/05/2011 0526   RDW 13.5 12/05/2011 0526     Assessment:  1. ESRD 2. Sp Fx Lt ankle 3. Anemia 4. HTN  Plan: 1. Surgery tomorrow per pt 2. Plan HD after surgery Scot Shiraishi T

## 2011-12-06 NOTE — Progress Notes (Signed)
Subjective: Patient complains of pelvic discomfort, esp with urination, ankle doing better  Objective: Weight change: -2.1 kg (-4 lb 10.1 oz)  Intake/Output Summary (Last 24 hours) at 12/03/11 0925 Last data filed at 12/02/11 1705  Gross per 24 hour  Intake   1200 ml  Output   1700 ml  Net   -500 ml    Filed Vitals:   12/03/11 0449  BP: 143/74  Pulse: 73  Temp: 97.8 F (36.6 C)  Resp: 16   General: Patient does not seem to be in any acute distress. Cardiovascular: Regular rate rhythm Lungs: Clear to auscultation bilaterally. Abdomen: Positive bowel sounds Extremities: Left lower extremity  With ext fixator  Lab Results: Reviewed  Micro Results: Recent Results (from the past 240 hour(s))  WOUND CULTURE     Status: Normal (Preliminary result)   Collection Time   12/02/11  4:39 PM      Component Value Range Status Comment   Specimen Description WOUND LEFT LEG   Final    Special Requests NONE   Final    Gram Stain PENDING   Incomplete    Culture NO GROWTH 1 DAY   Final    Report Status PENDING   Incomplete     Studies/Results: Dg Ankle 2 Views Left  12/02/2011  *RADIOLOGY REPORT*  Clinical Data: 77 year old male undergoing left ankle external fixation.  LEFT ANKLE - 2 VIEW  Comparison: 12/01/2011 and earlier.  Fluoroscopy time of 0.5 minutes was utilized.  Findings: Three intraoperative fluoroscopic views of the left ankle.  Previously seen cannulated screws have been removed.  The latter two images demonstrate external fixator hardware placement probably through the calcaneus.  IMPRESSION: Left ankle ORIF and external fixation hardware revision as above.  Original Report Authenticated By: Harley Hallmark, M.D.   Portable Chest 1 View  12/02/2011  *RADIOLOGY REPORT*  Clinical Data: Preop left ankle injury  PORTABLE CHEST - 1 VIEW  Comparison: None.  Findings: Stable enlarged cardiac silhouette with ectatic aorta. No effusion, infiltrate, or pneumothorax. No acute osseous  abnormality.  IMPRESSION: Mild cardiomegaly without acute cardiopulmonary findings.  Original Report Authenticated By: Genevive Bi, M.D.   Medications: Scheduled Meds:   .  ceFAZolin (ANCEF) IV  1 g Intravenous Q24H  . docusate sodium  100 mg Oral BID  . heparin  5,000 Units Subcutaneous Q8H  . metoprolol  25 mg Oral Daily  . multivitamin  1 tablet Oral QHS  . pantoprazole  20 mg Oral Q1200  . PARoxetine  30 mg Oral Daily  . senna  1 tablet Oral BID  . sevelamer  1,600 mg Oral TID WC  . DISCONTD: allopurinol  200 mg Oral Daily  . DISCONTD: calcium acetate  1,334 mg Oral TID WC  . DISCONTD:  ceFAZolin (ANCEF) IV  1 g Intravenous Q8H  . DISCONTD: sodium chloride  3 mL Intravenous Q12H   Continuous Infusions:  PRN Meds:.sodium chloride, sodium chloride, feeding supplement (NEPRO CARB STEADY), fentaNYL, gabapentin, heparin, HYDROcodone-acetaminophen, lidocaine, lidocaine-prilocaine, metoCLOPramide (REGLAN) injection, metoCLOPramide, morphine injection, ondansetron (ZOFRAN) IV, ondansetron, pentafluoroprop-tetrafluoroeth, polyethylene glycol, DISCONTD: sodium chloride, DISCONTD: acetaminophen, DISCONTD: acetaminophen, DISCONTD: alteplase DISCONTD: ondansetron (ZOFRAN) IV, DISCONTD: ondansetron, DISCONTD: sodium chloride, DISCONTD: sodium chloride irrigation  Assessment/Plan:  DM (diabetes mellitus) ()  Blood sugars are controlled. Will continue to monitor.   Ankle fracture () Patient did well with ext fixator, Followed per orthopedics. Patient to go to the OR on Thursday.     ESRD on dialysis () Patient had  hemodialysis MWF. Followed by nephrology.    Neuropathy (12/03/2011) Gabapentin .  Dysphagia Most likely related to intubation during surgery. That has resolved.  Pelvic discomfort: urinary retention: add flomax, likely from immobilization   Disposition:   Will likely need SNF     LOS: 2 days   MD Zannie Cove  12/05/2011, 9:25 AM Pager [phone]

## 2011-12-06 NOTE — Progress Notes (Signed)
Subjective: 4 Days Post-Op Procedure(s) (LRB): EXTERNAL FIXATION LEG (Left) IRRIGATION AND DEBRIDEMENT EXTREMITY (Left) HARDWARE REMOVAL (Left) Doing ok Tolerated dialysis well yesterday C/o soreness in mouth Denies cp, sob  Objective: Current Vitals Blood pressure 147/57, pulse 67, temperature 98.6 F (37 C), temperature source Oral, resp. rate 18, height 5\' 8"  (1.727 m), weight 95.7 kg (210 lb 15.7 oz), SpO2 97.00%. Vital signs in last 24 hours: Temp:  [97.5 F (36.4 C)-98.6 F (37 C)] 98.6 F (37 C) (06/05 0620) Pulse Rate:  [67-82] 67  (06/05 0620) Resp:  [16-20] 18  (06/05 0620) BP: (119-156)/(55-81) 147/57 mmHg (06/05 0620) SpO2:  [92 %-97 %] 97 % (06/05 0620) Weight:  [95.7 kg (210 lb 15.7 oz)-98.8 kg (217 lb 13 oz)] 95.7 kg (210 lb 15.7 oz) (06/04 1400)  Intake/Output from previous day: 06/04 0701 - 06/05 0700 In: 600 [P.O.:600] Out: 2850 [Urine:400] Intake/Output      06/04 0701 - 06/05 0700 06/05 0701 - 06/06 0700   P.O. 600    Total Intake(mL/kg) 600 (6.3)    Urine (mL/kg/hr) 400 (0.2)    Other 2450    Total Output 2850    Net -2250            LABS  Basename 12/05/11 0526 12/04/11 0625  HGB 10.6* 10.5*    Basename 12/05/11 0526 12/04/11 0625  WBC 6.5 8.0  RBC 3.19* 3.16*  HCT 30.8* 30.9*  PLT 166 147*    Basename 12/06/11 0605 12/05/11 0526  NA 134* 132*  K 3.8 4.5  CL 94* 92*  CO2 27 23  BUN 32* 54*  CREATININE 5.36* 7.53*  GLUCOSE 110* 99  CALCIUM 8.6 8.0*   No results found for this basename: LABPT:2,INR:2 in the last 72 hours  Physical Exam  RUE:AVWUJ, alert, nad Lungs:no resp distress Cardiac:reg Abd:+ BS WJX:BJYN Lower extremity  Ex fix stable  Skin wrinkles  No change in exam    Assessment/Plan: 4 Days Post-Op Procedure(s) (LRB): EXTERNAL FIXATION LEG (Left) IRRIGATION AND DEBRIDEMENT EXTREMITY (Left) HARDWARE REMOVAL (Left)  77 y/o male s/p fall with L pilon fracture complicated by several medical comorbidities    1. Fall  2. L pilon fracture s/p ex fix   NWB   OR tomorrow for ORIF with removal of fixator, pt will be NWB 8 weeks after surgery 3. Diabetic peripheral neuropathy   On gabapentin  4. DM   Good control   Continue to monitor  5. Metabolic bone disease   Labs pending 6. DVT/PE prophylaxis   Heparin per primary team   Put in order to stop at MN in preparation for surgery  Restart after OR  Will likely discharge home on ASA x 6-8 weeks 7. FEN   Hyponatremia, improved  Diet as tolerated, NPO after MN  Continue to advance bowel regimen  8. Orlal thrush  Magic mouthwash 9. Dispo   OR tomorrow  Dialysis after OR   Mearl Latin, PA-C [facility] [phone] (P) 12/06/2011, 8:36 AM

## 2011-12-06 NOTE — Progress Notes (Signed)
Physical Therapy Treatment Patient Details Name: [name] [name] MRN: [id number] DOB: Feb 20, 1935 Today's Date: 12/06/2011 Time: 1410-1436 PT Time Calculation (min): 26 min  PT Assessment / Plan / Recommendation Comments on Treatment Session  Pt reports feeling weaker today.  Mobility appeared to be more effortful than it has in previous PT sessions.  Initiated stair training with pt- will need to review/practice again before d/cing home.  Pt's wife is requesting a new w/c with elevating leg rest for home due to his personal w/c does not have a elevating leg rest- Case Manager & RN made aware.      Follow Up Recommendations  Home health PT;Supervision/Assistance - 24 hour    Barriers to Discharge        Equipment Recommendations  Rolling walker with 5" wheels;Wheelchair (measurements);Other (comment) (w/c with elevating leg rest (Lt side))    Recommendations for Other Services    Frequency Min 5X/week   Plan Discharge plan remains appropriate    Precautions / Restrictions Precautions Precautions: Fall Restrictions Weight Bearing Restrictions: Yes LLE Weight Bearing: Non weight bearing       Mobility  Bed Mobility Bed Mobility: Supine to Sit;Sitting - Scoot to Edge of Bed Supine to Sit: 5: Supervision;HOB flat Sitting - Scoot to Edge of Bed: 5: Supervision Details for Bed Mobility Assistance: Transitioning appeared to be more effortful today than it has in previous PT session but still no physical (A) required.   Transfers Transfers: Sit to Stand;Stand to Sit Sit to Stand: 4: Min assist;With upper extremity assist;From bed Stand to Sit: 4: Min assist;With upper extremity assist;With armrests;To chair/3-in-1;To bed Details for Transfer Assistance: Continues to require cues for hand placement, NWBing LLE, & technique.  Increased difficulty with achieving standing from bed on 1st trial.  Pt finally achieved standing by placing both hands on RW & therapist stabilizing RW.     Ambulation/Gait Ambulation/Gait Assistance: 4: Min assist Ambulation Distance (Feet): 25 Feet Assistive device: Rolling walker Ambulation/Gait Assistance Details: (A) for balance & safety.   Gait Pattern: Step-to pattern Stairs: Yes Stairs Assistance: 3: Mod assist Stairs Assistance Details (indicate cue type and reason): (A) for balance, RW management, & safety.  Cues for technique.  Pt seemed to have difficulty with following cues for technique.   Stair Management Technique: No rails;Backwards;With walker Number of Stairs: 1  Wheelchair Mobility Wheelchair Mobility: No    Exercises     PT Diagnosis:    PT Problem List:   PT Treatment Interventions:     PT Goals Acute Rehab PT Goals Time For Goal Achievement: 12/10/11 PT Goal: Supine/Side to Sit - Progress: Not met PT Goal: Sit to Stand - Progress: Not met PT Goal: Stand to Sit - Progress: Not met PT Goal: Ambulate - Progress: Not met PT Goal: Up/Down Stairs - Progress: Progressing toward goal  Visit Information  Last PT Received On: 12/06/11 Assistance Needed: +1    Subjective Data      Cognition  Overall Cognitive Status: Appears within functional limits for tasks assessed/performed Arousal/Alertness: Awake/alert Orientation Level: Appears intact for tasks assessed Behavior During Session: [facility] for tasks performed    Balance     End of Session PT - End of Session Equipment Utilized During Treatment: Gait belt Activity Tolerance: Patient limited by fatigue;Patient tolerated treatment well Patient left: in chair;with call bell/phone within reach;with family/visitor present    Lara Mulch 12/06/2011, 3:12 PM  Verdell Face, PTA [phone] 12/06/2011

## 2011-12-07 ENCOUNTER — Encounter (HOSPITAL_COMMUNITY)
Admission: RE | Disposition: A | Discharge status: Home Health | Payer: Self-pay | Source: Other Acute Inpatient Hospital | Attending: Internal Medicine

## 2011-12-07 ENCOUNTER — Inpatient Hospital Stay (HOSPITAL_COMMUNITY): Payer: Medicare PPO

## 2011-12-07 ENCOUNTER — Inpatient Hospital Stay (HOSPITAL_COMMUNITY): Payer: Medicare PPO | Admitting: Anesthesiology

## 2011-12-07 ENCOUNTER — Encounter (HOSPITAL_COMMUNITY): Payer: Self-pay | Admitting: Anesthesiology

## 2011-12-07 DIAGNOSIS — N186 End stage renal disease: Secondary | ICD-10-CM

## 2011-12-07 DIAGNOSIS — I10 Essential (primary) hypertension: Secondary | ICD-10-CM

## 2011-12-07 DIAGNOSIS — S82409A Unspecified fracture of shaft of unspecified fibula, initial encounter for closed fracture: Secondary | ICD-10-CM

## 2011-12-07 DIAGNOSIS — S82209A Unspecified fracture of shaft of unspecified tibia, initial encounter for closed fracture: Secondary | ICD-10-CM

## 2011-12-07 HISTORY — PX: ORIF ANKLE FRACTURE: SHX5408

## 2011-12-07 LAB — CBC
MCV: 97 fL (ref 78.0–100.0)
Platelets: 162 10*3/uL (ref 150–400)
RBC: 3.32 MIL/uL — ABNORMAL LOW (ref 4.22–5.81)
RDW: 13.6 % (ref 11.5–15.5)
WBC: 7.9 10*3/uL (ref 4.0–10.5)

## 2011-12-07 LAB — GLUCOSE, CAPILLARY
Glucose-Capillary: 110 mg/dL — ABNORMAL HIGH (ref 70–99)
Glucose-Capillary: 93 mg/dL (ref 70–99)
Glucose-Capillary: 112 mg/dL — ABNORMAL HIGH (ref 70–99)

## 2011-12-07 LAB — RENAL FUNCTION PANEL
Albumin: 2.5 g/dL — ABNORMAL LOW (ref 3.5–5.2)
BUN: 44 mg/dL — ABNORMAL HIGH (ref 6–23)
Chloride: 93 mEq/L — ABNORMAL LOW (ref 96–112)
GFR calc Af Amer: 9 mL/min — ABNORMAL LOW (ref >90)
Phosphorus: 4.6 mg/dL (ref 2.3–4.6)
Potassium: 4 mEq/L (ref 3.5–5.1)
Sodium: 132 mEq/L — ABNORMAL LOW (ref 135–145)

## 2011-12-07 LAB — ANAEROBIC CULTURE

## 2011-12-07 LAB — GRAM STAIN

## 2011-12-07 SURGERY — OPEN REDUCTION INTERNAL FIXATION (ORIF) ANKLE FRACTURE
Anesthesia: General | Site: Ankle | Laterality: Left

## 2011-12-07 MED ORDER — MORPHINE SULFATE (PF) 1 MG/ML IV SOLN: INTRAVENOUS | Status: DC

## 2011-12-07 MED ORDER — ONDANSETRON HCL 4 MG PO TABS: 4.0000 mg | ORAL_TABLET | Freq: Four times a day (QID) | ORAL | Status: DC | PRN

## 2011-12-07 MED ORDER — POTASSIUM CHLORIDE IN NACL 20-0.9 MEQ/L-% IV SOLN
INTRAVENOUS | Status: DC
  Filled 2011-12-07: qty 1000

## 2011-12-07 MED ORDER — VECURONIUM BROMIDE 10 MG IV SOLR
INTRAVENOUS | Status: DC | PRN
  Administered 2011-12-07: 1 mg via INTRAVENOUS
  Administered 2011-12-07: 7 mg via INTRAVENOUS
  Administered 2011-12-07: 1 mg via INTRAVENOUS

## 2011-12-07 MED ORDER — LIDOCAINE HCL (CARDIAC) 20 MG/ML IV SOLN
INTRAVENOUS | Status: DC | PRN
  Administered 2011-12-07: 100 mg via INTRAVENOUS
  Administered 2011-12-07: 50 mg via INTRAVENOUS

## 2011-12-07 MED ORDER — SODIUM CHLORIDE 0.9 % IJ SOLN: 9.0000 mL | INTRAMUSCULAR | Status: DC | PRN

## 2011-12-07 MED ORDER — NEOSTIGMINE METHYLSULFATE 1 MG/ML IJ SOLN
INTRAMUSCULAR | Status: DC | PRN
  Administered 2011-12-07: 3 mg via INTRAVENOUS

## 2011-12-07 MED ORDER — SENNOSIDES-DOCUSATE SODIUM 8.6-50 MG PO TABS: 1.0000 | ORAL_TABLET | Freq: Every evening | ORAL | Status: DC | PRN

## 2011-12-07 MED ORDER — HEPARIN SODIUM (PORCINE) 5000 UNIT/ML IJ SOLN: 5000.0000 [IU] | Freq: Two times a day (BID) | INTRAMUSCULAR | Status: DC

## 2011-12-07 MED ORDER — ONDANSETRON HCL 4 MG/2ML IJ SOLN: 4.0000 mg | Freq: Four times a day (QID) | INTRAMUSCULAR | Status: DC | PRN

## 2011-12-07 MED ORDER — CEFAZOLIN SODIUM 1-5 GM-% IV SOLN
1.0000 g | Freq: Three times a day (TID) | INTRAVENOUS | Status: AC
  Administered 2011-12-07 – 2011-12-08 (×3): 1 g via INTRAVENOUS
  Filled 2011-12-07 (×3): qty 50

## 2011-12-07 MED ORDER — PHENYLEPHRINE HCL 10 MG/ML IJ SOLN
INTRAMUSCULAR | Status: DC | PRN
Start: 2011-12-07 — End: 2011-12-07
  Administered 2011-12-07 (×2): 80 ug via INTRAVENOUS

## 2011-12-07 MED ORDER — 0.9 % SODIUM CHLORIDE (POUR BTL) OPTIME
TOPICAL | Status: DC | PRN
  Administered 2011-12-07: 1000 mL

## 2011-12-07 MED ORDER — FENTANYL CITRATE 0.05 MG/ML IJ SOLN
INTRAMUSCULAR | Status: DC | PRN
  Administered 2011-12-07: 100 ug via INTRAVENOUS

## 2011-12-07 MED ORDER — CEFAZOLIN SODIUM 1-5 GM-% IV SOLN
INTRAVENOUS | Status: AC
  Filled 2011-12-07: qty 50

## 2011-12-07 MED ORDER — NALOXONE HCL 0.4 MG/ML IJ SOLN: 0.4000 mg | INTRAMUSCULAR | Status: DC | PRN

## 2011-12-07 MED ORDER — MORPHINE SULFATE 2 MG/ML IJ SOLN
1.0000 mg | INTRAMUSCULAR | Status: DC | PRN
  Administered 2011-12-07: 1 mg via INTRAVENOUS
  Filled 2011-12-07: qty 1

## 2011-12-07 MED ORDER — ONDANSETRON HCL 4 MG/2ML IJ SOLN: 4.0000 mg | Freq: Once | INTRAMUSCULAR | Status: AC | PRN

## 2011-12-07 MED ORDER — DIPHENHYDRAMINE HCL 12.5 MG/5ML PO ELIX: 12.5000 mg | ORAL_SOLUTION | Freq: Four times a day (QID) | ORAL | Status: DC | PRN

## 2011-12-07 MED ORDER — DIPHENHYDRAMINE HCL 50 MG/ML IJ SOLN: 12.5000 mg | Freq: Four times a day (QID) | INTRAMUSCULAR | Status: DC | PRN

## 2011-12-07 MED ORDER — HYDROMORPHONE HCL PF 1 MG/ML IJ SOLN
0.2500 mg | INTRAMUSCULAR | Status: DC | PRN
  Administered 2011-12-07 (×2): 0.5 mg via INTRAVENOUS
  Filled 2011-12-07 (×2): qty 1

## 2011-12-07 MED ORDER — METOCLOPRAMIDE HCL 5 MG/ML IJ SOLN: 5.0000 mg | Freq: Three times a day (TID) | INTRAMUSCULAR | Status: DC | PRN

## 2011-12-07 MED ORDER — LANSOPRAZOLE 15 MG PO CPDR
15.0000 mg | DELAYED_RELEASE_CAPSULE | Freq: Every day | ORAL | Status: DC
  Administered 2011-12-07 – 2011-12-09 (×3): 15 mg via ORAL

## 2011-12-07 MED ORDER — METHOCARBAMOL 500 MG PO TABS: 500.0000 mg | ORAL_TABLET | Freq: Four times a day (QID) | ORAL | Status: DC | PRN

## 2011-12-07 MED ORDER — SODIUM CHLORIDE 0.9 % IV SOLN
20.0000 mg | INTRAVENOUS | Status: DC | PRN
  Administered 2011-12-07: 25 ug/min via INTRAVENOUS

## 2011-12-07 MED ORDER — HYDROCODONE-ACETAMINOPHEN 5-325 MG PO TABS
ORAL_TABLET | ORAL | Status: AC
  Filled 2011-12-07: qty 2

## 2011-12-07 MED ORDER — PROPOFOL 10 MG/ML IV BOLUS
INTRAVENOUS | Status: DC | PRN
  Administered 2011-12-07: 50 mg via INTRAVENOUS
  Administered 2011-12-07: 110 mg via INTRAVENOUS

## 2011-12-07 MED ORDER — GLYCOPYRROLATE 0.2 MG/ML IJ SOLN
INTRAMUSCULAR | Status: DC | PRN
  Administered 2011-12-07: 0.2 mg via INTRAVENOUS
  Administered 2011-12-07: .4 mg via INTRAVENOUS

## 2011-12-07 MED ORDER — METOCLOPRAMIDE HCL 10 MG PO TABS: 5.0000 mg | ORAL_TABLET | Freq: Three times a day (TID) | ORAL | Status: DC | PRN

## 2011-12-07 MED ORDER — METHOCARBAMOL 100 MG/ML IJ SOLN
500.0000 mg | Freq: Four times a day (QID) | INTRAVENOUS | Status: DC | PRN
  Filled 2011-12-07: qty 10

## 2011-12-07 SURGICAL SUPPLY — 90 items
BANDAGE ELASTIC 4 VELCRO ST LF (GAUZE/BANDAGES/DRESSINGS) ×2 IMPLANT
BANDAGE ELASTIC 6 VELCRO ST LF (GAUZE/BANDAGES/DRESSINGS) ×2 IMPLANT
BANDAGE ESMARK 6X9 LF (GAUZE/BANDAGES/DRESSINGS) ×1 IMPLANT
BANDAGE GAUZE ELAST BULKY 4 IN (GAUZE/BANDAGES/DRESSINGS) IMPLANT
BIT DRILL 2.5X2.75 QC CALB (BIT) ×2 IMPLANT
BIT DRILL 2.9X70 QC CALB (BIT) ×2 IMPLANT
BIT DRILL 3.5X5.5 QC CALB (BIT) ×2 IMPLANT
BIT DRILL CALIBRATED 2.7 (BIT) ×2 IMPLANT
BLADE SURG 10 STRL SS (BLADE) ×2 IMPLANT
BNDG COHESIVE 4X5 TAN STRL (GAUZE/BANDAGES/DRESSINGS) IMPLANT
BNDG ESMARK 6X9 LF (GAUZE/BANDAGES/DRESSINGS) ×2
BRUSH SCRUB DISP (MISCELLANEOUS) ×4 IMPLANT
CLOTH BEACON ORANGE TIMEOUT ST (SAFETY) ×2 IMPLANT
COVER SURGICAL LIGHT HANDLE (MISCELLANEOUS) ×4 IMPLANT
DRAPE C-ARM 42X72 X-RAY (DRAPES) ×2 IMPLANT
DRAPE C-ARMOR (DRAPES) ×2 IMPLANT
DRAPE ORTHO SPLIT 77X108 STRL (DRAPES) ×2
DRAPE PROXIMA HALF (DRAPES) ×2 IMPLANT
DRAPE SURG ORHT 6 SPLT 77X108 (DRAPES) ×2 IMPLANT
DRAPE U-SHAPE 47X51 STRL (DRAPES) ×2 IMPLANT
DRSG ADAPTIC 3X8 NADH LF (GAUZE/BANDAGES/DRESSINGS) ×2 IMPLANT
DRSG EMULSION OIL 3X3 NADH (GAUZE/BANDAGES/DRESSINGS) IMPLANT
ELECT CAUTERY BLADE 6.4 (BLADE) ×2 IMPLANT
ELECT REM PT RETURN 9FT ADLT (ELECTROSURGICAL) ×2
ELECTRODE REM PT RTRN 9FT ADLT (ELECTROSURGICAL) ×1 IMPLANT
GLOVE BIO SURGEON STRL SZ7.5 (GLOVE) ×4 IMPLANT
GLOVE BIO SURGEON STRL SZ8 (GLOVE) ×4 IMPLANT
GLOVE BIOGEL PI IND STRL 6.5 (GLOVE) ×1 IMPLANT
GLOVE BIOGEL PI IND STRL 7.5 (GLOVE) ×1 IMPLANT
GLOVE BIOGEL PI IND STRL 8 (GLOVE) ×1 IMPLANT
GLOVE BIOGEL PI INDICATOR 6.5 (GLOVE) ×1
GLOVE BIOGEL PI INDICATOR 7.5 (GLOVE) ×1
GLOVE BIOGEL PI INDICATOR 8 (GLOVE) ×1
GLOVE SS BIOGEL STRL SZ 6.5 (GLOVE) ×1 IMPLANT
GLOVE SUPERSENSE BIOGEL SZ 6.5 (GLOVE) ×1
GOWN PREVENTION PLUS XLARGE (GOWN DISPOSABLE) ×2 IMPLANT
GOWN PREVENTION PLUS XXLARGE (GOWN DISPOSABLE) ×2 IMPLANT
GOWN STRL NON-REIN LRG LVL3 (GOWN DISPOSABLE) ×4 IMPLANT
KIT BASIN OR (CUSTOM PROCEDURE TRAY) ×2 IMPLANT
KIT ROOM TURNOVER OR (KITS) ×2 IMPLANT
MANIFOLD NEPTUNE II (INSTRUMENTS) ×2 IMPLANT
NEEDLE HYPO 21X1.5 SAFETY (NEEDLE) IMPLANT
NS IRRIG 1000ML POUR BTL (IV SOLUTION) ×2 IMPLANT
PACK GENERAL/GYN (CUSTOM PROCEDURE TRAY) ×2 IMPLANT
PAD ARMBOARD 7.5X6 YLW CONV (MISCELLANEOUS) ×2 IMPLANT
PAD CAST 4YDX4 CTTN HI CHSV (CAST SUPPLIES) ×1 IMPLANT
PADDING CAST COTTON 4X4 STRL (CAST SUPPLIES) ×1
PADDING CAST COTTON 6X4 STRL (CAST SUPPLIES) ×2 IMPLANT
PENCIL BUTTON HOLSTER BLD 10FT (ELECTRODE) ×2 IMPLANT
PLATE 6H 142 LT MED DIST TIB (Plate) ×2 IMPLANT
PLATE ACE 100DEG 7HOLE (Plate) ×2 IMPLANT
SCREW CORT T15 TPR 55X3.5XST (Screw) ×1 IMPLANT
SCREW CORTICAL 3.5MM  12MM (Screw) ×2 IMPLANT
SCREW CORTICAL 3.5MM  28MM (Screw) ×1 IMPLANT
SCREW CORTICAL 3.5MM  34MM (Screw) ×1 IMPLANT
SCREW CORTICAL 3.5MM  44MM (Screw) ×1 IMPLANT
SCREW CORTICAL 3.5MM  55MM (Screw) ×1 IMPLANT
SCREW CORTICAL 3.5MM 12MM (Screw) ×2 IMPLANT
SCREW CORTICAL 3.5MM 14MM (Screw) ×6 IMPLANT
SCREW CORTICAL 3.5MM 26MM (Screw) ×2 IMPLANT
SCREW CORTICAL 3.5MM 28MM (Screw) ×1 IMPLANT
SCREW CORTICAL 3.5MM 34MM (Screw) ×1 IMPLANT
SCREW CORTICAL 3.5MM 44MM (Screw) ×1 IMPLANT
SCREW CORTICAL 3.5MM 55MM (Screw) ×1 IMPLANT
SCREW CORTICAL 3.5X55MM (Screw) ×1 IMPLANT
SCREW LOCK CORT STAR 3.5X36 (Screw) ×2 IMPLANT
SCREW LOCK CORT STAR 3.5X42 (Screw) ×4 IMPLANT
SCREW LP 3.5 (Screw) ×2 IMPLANT
SCREW NLOCK CANC HEX 4X18 (Screw) ×1 IMPLANT
SCREW NLOCK CANC HEX 4X18 FIB (Screw) ×1 IMPLANT
SCREW NLOCK CANC HEX 4X20 (Screw) ×1 IMPLANT
SCREW NLOCK CANC HEX 4X20 FIB (Screw) ×1 IMPLANT
SCREW NLOCK CANC HEX 4X50 (Screw) ×2 IMPLANT
SPONGE GAUZE 4X4 12PLY (GAUZE/BANDAGES/DRESSINGS) ×4 IMPLANT
SPONGE LAP 18X18 X RAY DECT (DISPOSABLE) ×2 IMPLANT
SPONGE SCRUB IODOPHOR (GAUZE/BANDAGES/DRESSINGS) IMPLANT
STAPLER VISISTAT 35W (STAPLE) ×2 IMPLANT
SUCTION FRAZIER TIP 10 FR DISP (SUCTIONS) ×2 IMPLANT
SUT ETHILON 2 0 FS 18 (SUTURE) IMPLANT
SUT ETHILON 3 0 PS 1 (SUTURE) ×6 IMPLANT
SUT PDS AB 2-0 CT1 27 (SUTURE) IMPLANT
SUT VIC AB 2-0 CT1 27 (SUTURE) ×2
SUT VIC AB 2-0 CT1 TAPERPNT 27 (SUTURE) ×2 IMPLANT
SUT VIC AB 2-0 CT3 27 (SUTURE) ×2 IMPLANT
SYR CONTROL 10ML LL (SYRINGE) ×2 IMPLANT
TOWEL OR 17X24 6PK STRL BLUE (TOWEL DISPOSABLE) ×2 IMPLANT
TOWEL OR 17X26 10 PK STRL BLUE (TOWEL DISPOSABLE) ×4 IMPLANT
TUBE CONNECTING 12X1/4 (SUCTIONS) ×2 IMPLANT
UNDERPAD 30X30 INCONTINENT (UNDERPADS AND DIAPERS) ×2 IMPLANT
WATER STERILE IRR 1000ML POUR (IV SOLUTION) IMPLANT

## 2011-12-07 NOTE — Brief Op Note (Signed)
12/01/2011 - 12/07/2011  10:31 AM  PATIENT:  [name] [name]  77 y.o. male  PRE-OPERATIVE DIAGNOSIS:  LEFT PILON FRACTURE s/p external fixation  POST-OPERATIVE DIAGNOSIS:  LEFT PILON FRACTURE s/p external fixation  PROCEDURE:  Procedure(s) (LRB): 1. OPEN REDUCTION INTERNAL FIXATION (ORIF) ANKLE FRACTURE (Left), pilon, tibia & fibula 2. Removal of external fixation under anesthesia 3. Revision ORIF of ankle syndesmosis 4. Debridement of pin tract ulcerations skin, SQ, bone tibia 5. Stress flouro syndesmosis  SURGEON:  Surgeon(s) and Role:    * Budd Palmer, MD - Primary  PHYSICIAN ASSISTANT: Montez Morita, [facility]  ANESTHESIA:   general  EBL:  minimum  BLOOD ADMINISTERED:none  DRAINS: none   LOCAL MEDICATIONS USED:  NONE  SPECIMEN:  Two from ankle joint, anearobic and aerobic  DISPOSITION OF SPECIMEN:  micro  COUNTS:  YES  TOURNIQUET:  * No tourniquets in log *  DICTATION: .Other Dictation: Dictation Number [id number]  PLAN OF CARE: Admit to inpatient   PATIENT DISPOSITION:  PACU - hemodynamically stable.   Delay start of Pharmacological VTE agent (>24hrs) due to surgical blood loss or risk of bleeding: no

## 2011-12-07 NOTE — Progress Notes (Signed)
PT Cancel Note:   Pt in OR & dialysis after OR.    [name] [name], [place] [phone] 12/07/2011

## 2011-12-07 NOTE — Anesthesia Procedure Notes (Signed)
Procedure Name: Intubation Date/Time: 12/07/2011 7:52 AM Performed by: Marena Chancy Pre-anesthesia Checklist: Patient identified, Timeout performed, Emergency Drugs available, Suction available and Patient being monitored Patient Re-evaluated:Patient Re-evaluated prior to inductionOxygen Delivery Method: Circle system utilized Preoxygenation: Pre-oxygenation with 100% oxygen Intubation Type: IV induction Grade View: Grade I Tube type: Oral Tube size: 7.5 mm Number of attempts: 1 Secured at: 23 cm Tube secured with: Tape Dental Injury: Teeth and Oropharynx as per pre-operative assessment

## 2011-12-07 NOTE — Anesthesia Postprocedure Evaluation (Signed)
  Anesthesia Post-op Note  Patient: [name] [name]  Procedure(s) Performed: Procedure(s) (LRB): OPEN REDUCTION INTERNAL FIXATION (ORIF) ANKLE FRACTURE (Left)  Patient Location: PACU  Anesthesia Type: General  Level of Consciousness: awake, alert  and oriented  Airway and Oxygen Therapy: Patient Spontanous Breathing and Patient connected to nasal cannula oxygen  Post-op Pain: mild  Post-op Assessment: Post-op Vital signs reviewed and Patient's Cardiovascular Status Stable  Post-op Vital Signs: stable  Complications: No apparent anesthesia complications

## 2011-12-07 NOTE — Transfer of Care (Signed)
Immediate Anesthesia Transfer of Care Note  Patient: [name] [name]  Procedure(s) Performed: Procedure(s) (LRB): OPEN REDUCTION INTERNAL FIXATION (ORIF) ANKLE FRACTURE (Left)  Patient Location: PACU  Anesthesia Type: General  Level of Consciousness: awake, alert  and oriented  Airway & Oxygen Therapy: Patient Spontanous Breathing and Patient connected to face mask oxygen  Post-op Assessment: Report given to PACU RN, Post -op Vital signs reviewed and stable and Patient moving all extremities X 4  Post vital signs: Reviewed and stable  Complications: No apparent anesthesia complications

## 2011-12-07 NOTE — Anesthesia Preprocedure Evaluation (Addendum)
Anesthesia Evaluation  Patient identified by MRN, date of birth, ID band Patient awake    Reviewed: Allergy & Precautions, H&P , NPO status , Patient's Chart, lab work & pertinent test results, reviewed documented beta blocker date and time   Airway Mallampati: II      Dental  (+) Edentulous Upper and Lower Dentures   Pulmonary  breath sounds clear to auscultation        Cardiovascular hypertension, Pt. on home beta blockers Rhythm:Regular Rate:Normal     Neuro/Psych    GI/Hepatic GERD-  Controlled and Medicated,  Endo/Other  Diabetes mellitus-, Well Controlled, Type 2  Renal/GU DialysisRenal disease     Musculoskeletal   Abdominal   Peds  Hematology   Anesthesia Other Findings   Reproductive/Obstetrics                          Anesthesia Physical Anesthesia Plan  ASA: III  Anesthesia Plan: General   Post-op Pain Management:    Induction: Intravenous  Airway Management Planned: Oral ETT  Additional Equipment:   Intra-op Plan:   Post-operative Plan: Extubation in OR  Informed Consent: I have reviewed the patients History and Physical, chart, labs and discussed the procedure including the risks, benefits and alternatives for the proposed anesthesia with the patient or authorized representative who has indicated his/her understanding and acceptance.   Dental advisory given  Plan Discussed with: CRNA and Anesthesiologist  Anesthesia Plan Comments: (L. Pilon Fracture ESRD last HD 6/4 K- 4.0 Type 2 DM glucose 111 Htn S/P R. Nephrectomy for Ca  Plan GA  Kipp Brood, MD)      Anesthesia Quick Evaluation

## 2011-12-07 NOTE — Preoperative (Signed)
Beta Blockers   Reason not to administer Beta Blockers:Not Applicable 

## 2011-12-07 NOTE — Op Note (Signed)
[name] [name], [name] [name]               ACCOUNT NO.:  [id number]  MEDICAL RECORD NO.:  [id number]  LOCATION:  5036                         FACILITY:  [facility]  PHYSICIAN:  Doralee Albino. Carola Frost, M.D. DATE OF BIRTH:  [birth year]  DATE OF PROCEDURE:  12/07/2011 DATE OF DISCHARGE:                              OPERATIVE REPORT   PREOPERATIVE DIAGNOSIS:  Left ankle pilon fracture, tibia and fibula, status post external fixation.  POSTOPERATIVE DIAGNOSES:  Left ankle pilon fracture, tibia and fibula, status post external fixation, ruptured syndesmosis.  PROCEDURES: 1. ORIF of left pilon, tibia and fibula. 2. Removal of external fixator under anesthesia. 3. Revision ORIF of ankle syndesmosis. 4. Debridement of pin tract ulcerations, skin, subcu, and bone. 5. Stress fluoroscopy of the syndesmosis.  SURGEON:  Doralee Albino. Carola Frost, MD  ASSISTANT:  Montez Morita, PA-C  ANESTHESIA:  General.  COMPLICATIONS:  None.  ESTIMATED BLOOD LOSS:  Minimal.  TOURNIQUET:  None.  SPECIMENS:  Two from the ankle joint, which were sent for anaerobic and aerobic analysis with initial results negative for bacteria and positive PMNs.  DISPOSITION:  To PACU.  CONDITION:  Stable.  BRIEF SUMMARY AND INDICATION FOR PROCEDURE:  [name] [name] is a 77 year old diabetic male, who underwent ORIF of his left ankle syndesmosis in late February.  The initial surgery was complicated by mechanical complications and the patient was slow to return to full ambulation.  He was walking on level ground when he felt his left ankle gave way and subsequent x-ray, CT scan confirmed a fracture adjacent to his prior syndesmotic screws and extension into the joint.  This was treated initially by Dr. Jerl [name] who performed removal of hardware and spanning external fixation with excellent restoration of alignment.  I discussed with the patient given his diabetes and dialysis, the risk of infection. We had elevated him and placed him in a  compressive wrap from foot to knee in hopes of repairing his ankle in early interval.  Soft tissue was initially too swollen to proceed with definitive open internal fixation, but he did resolve the swelling quite effectively over the ensuing 4 days and presents today for repair.  He understood the complications to include wound infection which could lead to amputation and deep infection, nerve injury, vessel injury, exacerbation of his neuropathy, loss of motion, the possibility of need for additional fixation of the syndesmosis in many others.  He also understood anesthetic complications including heart attack, stroke, DVT, PE, and did again wish to proceed.  DESCRIPTION OF PROCEDURE:  [name] [name] was taken to operating room where general anesthesia was induced.  His left lower extremity was prepped and draped in usual sterile fashion.  I did keep the fixator and removed the bar after the prep and drape on the lateral side only.  I extended the incision proximally and distally over the fibula, carried dissection carefully down.  A anatomic reduction was made then of the fibula in a 8 hole plate placed provisionally under compression.  C-arm confirmed appropriate reduction and hardware placement.  Attention was then turned to the anterior to low fragment [place].  Under direct visualization, an incision was made anterolaterally.  Soft tissues were spread including  superficial peroneal nerve, the extensor tendons, and then the vessel which was retracted medially.  The near cortex was over drilled with a 3.5 drill, far cortex with a 2.5, and then a single lag screw placed with excellent purchase and compression, closing down the fracture site quite nicely.  A proper plate was selected for medial application and the holes marked over the skin.  Distally small incision was made.  The plate was slid in retrograde fashion over top of the periosteum, carefully elevating the saphenous vein.  It  was pinned provisionally and then standard screw placed to maximally appose the bone and the plate. This was followed by placement of additional standard fixation in the plafond segment and then 3 locked screws distally and 2 additional standard screws in the shaft proximally.  These were placed through small stab incisions to minimize trauma to the soft tissues in this patient.  Attention was then turned to the syndesmosis where an external stress view was obtained both static and then under live fluoro. Application of the stress did produce medial clear space widening secondary to lateral translation of the talus even with foot held in appropriate dorsiflexion.  Consequently, the syndesmotic interval was compressed with a large tenaculum clamp in the heads of the screws on each plate and then with a slight anterior angle, the more proximal of 2 syndesmotic screws were placed achieving quadricortical purchase and then the additional standard cancellous screw was exchanged for 1 more syndesmotic screw.  This resulted in excellent apposition of the syndesmotic interval, appropriate restoration of the medial clear space, and a stable syndesmotic fixation in interval.  All wounds were irrigated and closed in standard fashion.  The ulcerated pin sites of the tibia were debrided with curettes along the skin edges, the subcu, the muscle edge, and the bone irrigated thoroughly and 1 single nylon suture was placed in the center of this to eliminate some of the gapping to assist with closure while allowing for egress as well.  After application of sterile gently compressive dressing, the patient was placed into a posterior stirrup splint, awakened from anesthesia and transported to the PACU in stable condition.  Montez Morita, PA-C assisted me throughout the procedure and was necessary to control the leg to protect the superficial peroneal nerve, retract soft tissues around the fibula, maintain  provisional reduction, assist me with placement of definitive fixation on the fibula and also with control of leg during stress fluoro and with simultaneous wound closure to expedite the case.  PROGNOSIS:  The patient's risk was elevated because of his diabetes and neuropathy.  His risk of failure to unite his syndesmosis has also increased because of either chronic instability there or recurrent injury.  He does have stable fixation at this time.  If he can maintain compliance with weightbearing restrictions for the next 8 weeks, we do anticipate him to go on to heal.  He will be on DVT prophylaxis per the Renal Service which we anticipate with Lovenox until discharge from the hospital.  We will plan to see him back for removal of the sutures in 2 weeks and possible conversion to a boot at that time.     Doralee Albino. Carola Frost, M.D.     MHH/MEDQ  D:  12/07/2011  T:  12/07/2011  Job:  [id number]

## 2011-12-07 NOTE — Progress Notes (Signed)
[facility]  Just back from OR following ORIF of L ankle fx  On HD viaL forearm avf  BP--120/60 Goal--3 L Hgb-11 preop K+--4.0 preop

## 2011-12-07 NOTE — Progress Notes (Signed)
Subjective: Just back from PACU, drowsy and confused  Objective: Weight change: -2.1 kg (-4 lb 10.1 oz)  Intake/Output Summary (Last 24 hours) at 12/03/11 0925 Last data filed at 12/02/11 1705  Gross per 24 hour  Intake   1200 ml  Output   1700 ml  Net   -500 ml    Filed Vitals:   12/03/11 0449  BP: 143/74  Pulse: 73  Temp: 97.8 F (36.6 C)  Resp: 16   General: Patient does not seem to be in any acute distress, drowsy, easily aroused Cardiovascular: Regular rate rhythm Lungs: Clear to auscultation bilaterally. Abdomen: Positive bowel sounds Extremities: Left lower extremity  With ACE dressing  Lab Results: Reviewed  Micro Results: Recent Results (from the past 240 hour(s))  WOUND CULTURE     Status: Normal (Preliminary result)   Collection Time   12/02/11  4:39 PM      Component Value Range Status Comment   Specimen Description WOUND LEFT LEG   Final    Special Requests NONE   Final    Gram Stain PENDING   Incomplete    Culture NO GROWTH 1 DAY   Final    Report Status PENDING   Incomplete     Studies/Results: Dg Ankle 2 Views Left  12/02/2011  *RADIOLOGY REPORT*  Clinical Data: 77 year old male undergoing left ankle external fixation.  LEFT ANKLE - 2 VIEW  Comparison: 12/01/2011 and earlier.  Fluoroscopy time of 0.5 minutes was utilized.  Findings: Three intraoperative fluoroscopic views of the left ankle.  Previously seen cannulated screws have been removed.  The latter two images demonstrate external fixator hardware placement probably through the calcaneus.  IMPRESSION: Left ankle ORIF and external fixation hardware revision as above.  Original Report Authenticated By: Harley Hallmark, M.D.   Portable Chest 1 View  12/02/2011  *RADIOLOGY REPORT*  Clinical Data: Preop left ankle injury  PORTABLE CHEST - 1 VIEW  Comparison: None.  Findings: Stable enlarged cardiac silhouette with ectatic aorta. No effusion, infiltrate, or pneumothorax. No acute osseous abnormality.   IMPRESSION: Mild cardiomegaly without acute cardiopulmonary findings.  Original Report Authenticated By: Genevive Bi, M.D.   Medications: Scheduled Meds:   .  ceFAZolin (ANCEF) IV  1 g Intravenous Q24H  . docusate sodium  100 mg Oral BID  . heparin  5,000 Units Subcutaneous Q8H  . metoprolol  25 mg Oral Daily  . multivitamin  1 tablet Oral QHS  . pantoprazole  20 mg Oral Q1200  . PARoxetine  30 mg Oral Daily  . senna  1 tablet Oral BID  . sevelamer  1,600 mg Oral TID WC  . DISCONTD: allopurinol  200 mg Oral Daily  . DISCONTD: calcium acetate  1,334 mg Oral TID WC  . DISCONTD:  ceFAZolin (ANCEF) IV  1 g Intravenous Q8H  . DISCONTD: sodium chloride  3 mL Intravenous Q12H   Continuous Infusions:    . DISCONTD: 0.9 % NaCl with KCl 20 mEq / L     PRN Meds:.sodium chloride, sodium chloride, feeding supplement (NEPRO CARB STEADY), fentaNYL, gabapentin, heparin, HYDROcodone-acetaminophen, lidocaine, lidocaine-prilocaine, metoCLOPramide (REGLAN) injection, metoCLOPramide, morphine injection, ondansetron (ZOFRAN) IV, ondansetron, pentafluoroprop-tetrafluoroeth, polyethylene glycol, DISCONTD: sodium chloride, DISCONTD: acetaminophen, DISCONTD: acetaminophen, DISCONTD: alteplase DISCONTD: ondansetron (ZOFRAN) IV, DISCONTD: ondansetron, DISCONTD: sodium chloride, DISCONTD: sodium chloride irrigation  Assessment/Plan:  DM (diabetes mellitus) ()  Blood sugars are controlled. Will continue to monitor.   Ankle fracture () S/p ORIF today    ESRD on dialysis () Patient  had hemodialysis MWF. Followed by nephrology.    Neuropathy (12/03/2011) Gabapentin .  Dysphagia Most likely related to intubation during surgery. That has resolved.  Pelvic discomfort/ urinary retention: improved, continue flomax  DVT prophylaxis: Hep SQ  Disposition:   Will likely need SNF     LOS: 2 days   MD Zannie Cove  12/05/2011, 9:25 AM Pager [phone]

## 2011-12-07 NOTE — Progress Notes (Signed)
Arrived to PACU with face mask off, CRNA said wouldn't keep it on. Pt keeps taking o2 off either via [place] or facemask. Sats drop to high 80's then goes up to 90-94 when o2 applied.

## 2011-12-08 ENCOUNTER — Inpatient Hospital Stay (HOSPITAL_COMMUNITY): Payer: Medicare PPO

## 2011-12-08 ENCOUNTER — Encounter (HOSPITAL_COMMUNITY): Payer: Self-pay | Admitting: Orthopedic Surgery

## 2011-12-08 DIAGNOSIS — S93439A Sprain of tibiofibular ligament of unspecified ankle, initial encounter: Secondary | ICD-10-CM

## 2011-12-08 DIAGNOSIS — N186 End stage renal disease: Secondary | ICD-10-CM

## 2011-12-08 DIAGNOSIS — S82872A Displaced pilon fracture of left tibia, initial encounter for closed fracture: Secondary | ICD-10-CM

## 2011-12-08 DIAGNOSIS — S82409A Unspecified fracture of shaft of unspecified fibula, initial encounter for closed fracture: Secondary | ICD-10-CM

## 2011-12-08 DIAGNOSIS — B370 Candidal stomatitis: Secondary | ICD-10-CM

## 2011-12-08 DIAGNOSIS — I10 Essential (primary) hypertension: Secondary | ICD-10-CM

## 2011-12-08 DIAGNOSIS — N2581 Secondary hyperparathyroidism of renal origin: Secondary | ICD-10-CM

## 2011-12-08 DIAGNOSIS — S82209A Unspecified fracture of shaft of unspecified tibia, initial encounter for closed fracture: Secondary | ICD-10-CM

## 2011-12-08 LAB — CBC
MCH: 32.9 pg (ref 26.0–34.0)
MCHC: 33.7 g/dL (ref 30.0–36.0)
Platelets: 152 10*3/uL (ref 150–400)
RDW: 13.7 % (ref 11.5–15.5)

## 2011-12-08 LAB — RENAL FUNCTION PANEL
Albumin: 2.5 g/dL — ABNORMAL LOW (ref 3.5–5.2)
Calcium: 8.5 mg/dL (ref 8.4–10.5)
Creatinine, Ser: 4.97 mg/dL — ABNORMAL HIGH (ref 0.50–1.35)
GFR calc Af Amer: 12 mL/min — ABNORMAL LOW (ref >90)
GFR calc non Af Amer: 10 mL/min — ABNORMAL LOW (ref >90)
Phosphorus: 4.2 mg/dL (ref 2.3–4.6)
Sodium: 137 mEq/L (ref 135–145)

## 2011-12-08 LAB — GLUCOSE, CAPILLARY: Glucose-Capillary: 135 mg/dL — ABNORMAL HIGH (ref 70–99)

## 2011-12-08 LAB — VITAMIN D 25 HYDROXY (VIT D DEFICIENCY, FRACTURES): Vit D, 25-Hydroxy: 26 ng/mL — ABNORMAL LOW (ref 30–89)

## 2011-12-08 MED ORDER — ASPIRIN EC 325 MG PO TBEC: 325.0000 mg | DELAYED_RELEASE_TABLET | Freq: Every day | ORAL | Status: AC

## 2011-12-08 MED ORDER — HYDROCODONE-ACETAMINOPHEN 5-325 MG PO TABS: 1.0000 | ORAL_TABLET | Freq: Four times a day (QID) | ORAL | Status: DC | PRN

## 2011-12-08 MED ORDER — ACETAMINOPHEN 500 MG PO TABS: 500.0000 mg | ORAL_TABLET | Freq: Four times a day (QID) | ORAL | Status: AC | PRN

## 2011-12-08 NOTE — Discharge Instructions (Addendum)
[facility] Discharge Instructions   General Discharge Instructions  WEIGHT BEARING STATUS: Nonweight-bearing Left Leg  RANGE OF MOTION/ACTIVITY:activity as tolerated while maintaining Weight bearing restrictions on Left Leg  Diet: as you were eating previously.  Can use over the counter stool softeners and bowel preparations, such as Miralax, to help with bowel movements.  Narcotics can be constipating.  Be sure to drink plenty of fluids  STOP SMOKING OR USING NICOTINE PRODUCTS!!!!  As discussed nicotine severely impairs your body's ability to heal surgical and traumatic wounds but also impairs bone healing.  Wounds and bone heal by forming microscopic blood vessels (angiogenesis) and nicotine is a vasoconstrictor (essentially, shrinks blood vessels).  Therefore, if vasoconstriction occurs to these microscopic blood vessels they essentially disappear and are unable to deliver necessary nutrients to the healing tissue.  This is one modifiable factor that you can do to dramatically increase your chances of healing your injury.    (This means no smoking, no nicotine gum, patches, etc)  DO NOT USE NONSTEROIDAL ANTI-INFLAMMATORY DRUGS (NSAID'S)  Using products such as Advil (ibuprofen), Aleve (naproxen), Motrin (ibuprofen) for additional pain control during fracture healing can delay and/or prevent the healing response.  If you would like to take over the counter (OTC) medication, Tylenol (acetaminophen) is ok.  However, some narcotic medications that are given for pain control contain acetaminophen as well. Therefore, you should not exceed more than 4000 mg of tylenol in a day if you do not have liver disease.  Also note that there are may OTC medicines, such as cold medicines and allergy medicines that my contain tylenol as well.  If you have any questions about medications and/or interactions please ask your doctor/PA or your pharmacist.   PAIN MEDICATION USE AND EXPECTATIONS  You  have likely been given narcotic medications to help control your pain.  After a traumatic event that results in an fracture (broken bone) with or without surgery, it is ok to use narcotic pain medications to help control one's pain.  We understand that everyone responds to pain differently and each individual patient will be evaluated on a regular basis for the continued need for narcotic medications. Ideally, narcotic medication use should last no more than 6-8 weeks (coinciding with fracture healing).   As a patient it is your responsibility as well to monitor narcotic medication use and report the amount and frequency you use these medications when you come to your office visit.   We would also advise that if you are using narcotic medications, you should take a dose prior to therapy to maximize you participation.  IF YOU ARE ON NARCOTIC MEDICATIONS IT IS NOT PERMISSIBLE TO OPERATE A MOTOR VEHICLE (MOTORCYCLE/CAR/TRUCK/MOPED) OR HEAVY MACHINERY DO NOT MIX NARCOTICS WITH OTHER CNS (CENTRAL NERVOUS SYSTEM) DEPRESSANTS SUCH AS ALCOHOL       ICE AND ELEVATE INJURED/OPERATIVE EXTREMITY  Using ice and elevating the injured extremity above your heart can help with swelling and pain control.  Icing in a pulsatile fashion, such as 20 minutes on and 20 minutes off, can be followed.    Do not place ice directly on skin. Make sure there is a barrier between to skin and the ice pack.    Using frozen items such as frozen peas works well as the conform nicely to the are that needs to be iced.  USE AN ACE WRAP OR TED HOSE FOR SWELLING CONTROL  In addition to icing and elevation, Ace wraps or TED hose are used to  help limit and resolve swelling.  It is recommended to use Ace wraps or TED hose until you are informed to stop.    When using Ace Wraps start the wrapping distally (farthest away from the body) and wrap proximally (closer to the body)   Example: If you had surgery on your leg or thing and you do not  have a splint on, start the ace wrap at the toes and work your way up to the thigh        If you had surgery on your upper extremity and do not have a splint on, start the ace wrap at your fingers and work your way up to the upper arm  IF YOU ARE IN A SPLINT OR CAST DO NOT REMOVE IT FOR ANY REASON   If your splint gets wet for any reason please contact the office immediately. You may shower in your splint or cast as long as you keep it dry.  This can be done by wrapping in a cast cover or garbage back (or similar)  Do Not stick any thing down your splint or cast such as pencils, money, or hangers to try and scratch yourself with.  If you feel itchy take benadryl as prescribed on the bottle for itching  CALL THE OFFICE WITH ANY QUESTIONS OR CONCERTS: [phone]   Home Health to be provided by [facility] [phone]

## 2011-12-08 NOTE — Progress Notes (Signed)
S:pain controlled.  Says plans are to be DC'd tomorrow O:BP 121/57  Pulse 81  Temp(Src) 98.7 F (37.1 C) (Oral)  Resp 18  Ht 5\' 8"  (1.727 m)  Wt 93.7 kg (206 lb 9.1 oz)  BMI 31.41 kg/m2  SpO2 97%  Intake/Output Summary (Last 24 hours) at 12/08/11 0906 Last data filed at 12/07/11 1700  Gross per 24 hour  Intake    550 ml  Output   2400 ml  Net  -1850 ml   Weight change:  ZOX:WRUEA and alert CVS:RRR Resp:clear  Abd:+ BS NTND Ext:no edema.  Lt leg bandaged Lt AVF + bruit NEURO:CNI Ox3      .  ceFAZolin (ANCEF) IV  1 g Intravenous Q8H  . darbepoetin (ARANESP) injection - DIALYSIS  25 mcg Intravenous Q Tue-HD  . docusate sodium  100 mg Oral BID  . feeding supplement (NEPRO CARB STEADY)  237 mL Oral BID BM  . gabapentin  300 mg Oral QHS  . heparin  5,000 Units Subcutaneous Q12H  . HYDROcodone-acetaminophen      . lansoprazole  15 mg Oral Q1200  . magic mouthwash  5 mL Oral TID  . metoCLOPramide (REGLAN) injection  5 mg Intravenous Q12H  . metoprolol  25 mg Oral Daily  . multivitamin  1 tablet Oral QHS  . PARoxetine  30 mg Oral Daily  . polyethylene glycol  17 g Oral BID  . senna  1 tablet Oral BID  . sevelamer  1,600 mg Oral TID WC  . Tamsulosin HCl  0.4 mg Oral Daily  . DISCONTD: heparin  5,000 Units Subcutaneous Q12H  . DISCONTD: lansoprazole  15 mg Oral Q1200  . DISCONTD: morphine   Intravenous Q4H   Dg Ankle Complete Left  12/07/2011  *RADIOLOGY REPORT*  Clinical Data:  Ankle fracture  DG C-ARM 61-120 MIN,LEFT ANKLE COMPLETE - 3+ VIEW  Comparison:  Multiple priors  Findings: The patient's is status post removal of external fixation hardware and the open reduction internal fixation of distal tibia and fibular fractures using plate and screw fixation. Improved position and alignment.  IMPRESSION: As above.  Original Report Authenticated By: Elsie Stain, M.D.   Dg C-arm 61-120 Min  12/07/2011  *RADIOLOGY REPORT*  Clinical Data:  Ankle fracture  DG C-ARM 61-120  MIN,LEFT ANKLE COMPLETE - 3+ VIEW  Comparison:  Multiple priors  Findings: The patient's is status post removal of external fixation hardware and the open reduction internal fixation of distal tibia and fibular fractures using plate and screw fixation. Improved position and alignment.  IMPRESSION: As above.  Original Report Authenticated By: Elsie Stain, M.D.   BMET    Component Value Date/Time   NA 137 12/08/2011 0514   K 3.8 12/08/2011 0514   CL 97 12/08/2011 0514   CO2 27 12/08/2011 0514   GLUCOSE 153* 12/08/2011 0514   BUN 29* 12/08/2011 0514   CREATININE 4.97* 12/08/2011 0514   CALCIUM 8.5 12/08/2011 0514   CALCIUM 7.7* 12/05/2011 1148   GFRNONAA 10* 12/08/2011 0514   GFRAA 12* 12/08/2011 0514   CBC    Component Value Date/Time   WBC 8.1 12/08/2011 0514   RBC 3.13* 12/08/2011 0514   HGB 10.3* 12/08/2011 0514   HCT 30.6* 12/08/2011 0514   PLT 152 12/08/2011 0514   MCV 97.8 12/08/2011 0514   MCH 32.9 12/08/2011 0514   MCHC 33.7 12/08/2011 0514   RDW 13.7 12/08/2011 0514     Assessment:  1. ESRD 2. Sp  Fx Lt ankle 3. Anemia 4. HTN  Plan: 1. HD in am, First shift in case he is DC'dMATTINGLY,Mckell Riecke T

## 2011-12-08 NOTE — Progress Notes (Signed)
Subjective: 1 Day Post-Op Procedure(s) (LRB): OPEN REDUCTION INTERNAL FIXATION (ORIF) ANKLE FRACTURE (Left)   Doing well this am Not confused  On bed pan Pain controlled Tolerated HD yesterday No cp, no sob Tolerating diet  Objective: Current Vitals Blood pressure 121/57, pulse 81, temperature 98.7 F (37.1 C), temperature source Oral, resp. rate 18, height 5\' 8"  (1.727 m), weight 93.7 kg (206 lb 9.1 oz), SpO2 97.00%. Vital signs in last 24 hours: Temp:  [97.7 F (36.5 C)-99.4 F (37.4 C)] 98.7 F (37.1 C) (06/07 0639) Pulse Rate:  [64-89] 81  (06/07 0639) Resp:  [15-21] 18  (06/07 0639) BP: (108-143)/(57-90) 121/57 mmHg (06/07 0639) SpO2:  [92 %-99 %] 97 % (06/07 0639) FiO2 (%):  [2 %] 2 % (06/06 1700) Weight:  [93.7 kg (206 lb 9.1 oz)-97 kg (213 lb 13.5 oz)] 93.7 kg (206 lb 9.1 oz) (06/06 1700)  Intake/Output from previous day: 06/06 0701 - 06/07 0700 In: 550 [I.V.:550] Out: 2400  Intake/Output      06/06 0701 - 06/07 0700 06/07 0701 - 06/08 0700   P.O.     I.V. (mL/kg) 550 (5.9)    Total Intake(mL/kg) 550 (5.9)    Urine (mL/kg/hr)     Other 2400    Total Output 2400    Net -1850           LABS  Basename 12/08/11 0514 12/07/11 0520  HGB 10.3* 11.0*    Basename 12/08/11 0514 12/07/11 0520  WBC 8.1 7.9  RBC 3.13* 3.32*  HCT 30.6* 32.2*  PLT 152 162    Basename 12/08/11 0514 12/07/11 0520  NA 137 132*  K 3.8 4.0  CL 97 93*  CO2 27 25  BUN 29* 44*  CREATININE 4.97* 6.29*  GLUCOSE 153* 111*  CALCIUM 8.5 8.3*   No results found for this basename: LABPT:2,INR:2 in the last 72 hours  iPTH: 110 Vitamin D panel: P Physical Exam  AVW:[id number] well, NAD, very pleasant and lucid this am Lungs:clear anterior fields Cardiac:reg Abd:+ BS, NT Ext: Left Lower Extremity  Splint fitting well  Foot warm   EHL, FHL, lesser toe motor intact  Sensation at baseline  Swelling controlled  No pain with passive stretching    Assessment/Plan: 1 Day Post-Op  Procedure(s) (LRB): OPEN REDUCTION INTERNAL FIXATION (ORIF) ANKLE FRACTURE (Left)  77 y/o male s/p fall with L pilon fx  1. Fall 2. Closed L pilon fracture with syndesmotic disruption  POD 1  NWB x 8 weeks  Continue with splint until follow up visit  PT/OT  Pt with small step to enter dwelling, will need to clear steps with PT before he can d/c home, o/w stable from ortho standpoint  Ice and elevate 3. Diabetic peripheral neuropathy  Gabapentin 4. DM 5. Metabolic bone disease/secondary hyperparathyroidism  Related to CRI  Vitamin D panel pending  rec vitamin D and calcium supplementation if no objections from Renal service 6. DVT/PE prophylaxis  Heparin while in patient  Recommend discharge on ASA 325 mg po daily x 2 months 7. Diet  Renal diet 8. Oral thrush  Improved 9. Pain  Low dose narcotics as needed  Tylenol 10. Dispo  Ortho issues stable  Can discharge home Saturday or Sunday  Follow up with ortho in 2 weeks  Mearl Latin, PA-C [facility] [phone] (P) 12/08/2011, 8:59 AM

## 2011-12-08 NOTE — Progress Notes (Signed)
Subjective: Doing ok, still with pain in L ankle  Objective: Weight change: -2.1 kg (-4 lb 10.1 oz)  Intake/Output Summary (Last 24 hours) at 12/03/11 0925 Last data filed at 12/02/11 1705  Gross per 24 hour  Intake   1200 ml  Output   1700 ml  Net   -500 ml    Filed Vitals:   12/03/11 0449  BP: 143/74  Pulse: 73  Temp: 97.8 F (36.6 C)  Resp: 16   General: AAOx3, Patient does not seem to be in any acute distress,  Cardiovascular: Regular rate rhythm Lungs: Clear to auscultation bilaterally. Abdomen: Positive bowel sounds Extremities: Left lower extremity  With ACE dressing  Lab Results: Reviewed  Micro Results: Recent Results (from the past 240 hour(s))  WOUND CULTURE     Status: Normal (Preliminary result)   Collection Time   12/02/11  4:39 PM      Component Value Range Status Comment   Specimen Description WOUND LEFT LEG   Final    Special Requests NONE   Final    Gram Stain PENDING   Incomplete    Culture NO GROWTH 1 DAY   Final    Report Status PENDING   Incomplete     Studies/Results: Dg Ankle 2 Views Left  12/02/2011  *RADIOLOGY REPORT*  Clinical Data: 77 year old male undergoing left ankle external fixation.  LEFT ANKLE - 2 VIEW  Comparison: 12/01/2011 and earlier.  Fluoroscopy time of 0.5 minutes was utilized.  Findings: Three intraoperative fluoroscopic views of the left ankle.  Previously seen cannulated screws have been removed.  The latter two images demonstrate external fixator hardware placement probably through the calcaneus.  IMPRESSION: Left ankle ORIF and external fixation hardware revision as above.  Original Report Authenticated By: Harley Hallmark, M.D.   Portable Chest 1 View  12/02/2011  *RADIOLOGY REPORT*  Clinical Data: Preop left ankle injury  PORTABLE CHEST - 1 VIEW  Comparison: None.  Findings: Stable enlarged cardiac silhouette with ectatic aorta. No effusion, infiltrate, or pneumothorax. No acute osseous abnormality.  IMPRESSION: Mild  cardiomegaly without acute cardiopulmonary findings.  Original Report Authenticated By: Genevive Bi, M.D.   Medications: Scheduled Meds:   .  ceFAZolin (ANCEF) IV  1 g Intravenous Q24H  . docusate sodium  100 mg Oral BID  . heparin  5,000 Units Subcutaneous Q8H  . metoprolol  25 mg Oral Daily  . multivitamin  1 tablet Oral QHS  . pantoprazole  20 mg Oral Q1200  . PARoxetine  30 mg Oral Daily  . senna  1 tablet Oral BID  . sevelamer  1,600 mg Oral TID WC  . DISCONTD: allopurinol  200 mg Oral Daily  . DISCONTD: calcium acetate  1,334 mg Oral TID WC  . DISCONTD:  ceFAZolin (ANCEF) IV  1 g Intravenous Q8H  . DISCONTD: sodium chloride  3 mL Intravenous Q12H   Continuous Infusions:    . DISCONTD: 0.9 % NaCl with KCl 20 mEq / L     PRN Meds:.sodium chloride, sodium chloride, feeding supplement (NEPRO CARB STEADY), fentaNYL, gabapentin, heparin, HYDROcodone-acetaminophen, lidocaine, lidocaine-prilocaine, metoCLOPramide (REGLAN) injection, metoCLOPramide, morphine injection, ondansetron (ZOFRAN) IV, ondansetron, pentafluoroprop-tetrafluoroeth, polyethylene glycol, DISCONTD: sodium chloride, DISCONTD: acetaminophen, DISCONTD: acetaminophen, DISCONTD: alteplase DISCONTD: ondansetron (ZOFRAN) IV, DISCONTD: ondansetron, DISCONTD: sodium chloride, DISCONTD: sodium chloride irrigation  Assessment/Plan:  DM (diabetes mellitus) ()  Blood sugars are controlled. Will continue to monitor.   Ankle fracture () S/p ORIF 6/6, NWB for 8weeks, FU with ORtho  ESRD on dialysis () Patient had hemodialysis MWF. Followed by nephrology.    Neuropathy (12/03/2011) Gabapentin .  Dysphagia Most likely related to intubation during surgery. That has resolved.  Pelvic discomfort/ urinary retention: improved, continue flomax  DVT prophylaxis: Hep SQ  Disposition:   Home with home health tomorrow if stable      LOS: 2 days  MD Zannie Cove  12/05/2011, 9:25 AM Pager [phone]

## 2011-12-08 NOTE — Progress Notes (Signed)
PT Progress Note:     12/08/11 1200  PT Visit Information  Last PT Received On 12/08/11  Assistance Needed +1  PT Time Calculation  PT Start Time 1127  PT Stop Time 1204  PT Time Calculation (min) 37 min  Restrictions  LLE Weight Bearing NWB  Cognition  Overall Cognitive Status Appears within functional limits for tasks assessed/performed  Arousal/Alertness Awake/alert  Orientation Level Appears intact for tasks assessed  Behavior During Session [facility] for tasks performed  Bed Mobility  Bed Mobility Supine to Sit  Supine to Sit 6: Modified independent (Device/Increase time);HOB flat  Sitting - Scoot to Edge of Bed 6: Modified independent (Device/Increase time)  Details for Bed Mobility Assistance Increased time to complete  Transfers  Transfers Sit to Stand;Stand to Sit;Stand Pivot Transfers  Sit to Stand 4: Min assist;With upper extremity assist;With armrests;From bed;From chair/3-in-1  Stand to Sit 4: Min assist;With upper extremity assist;With armrests;To chair/3-in-1  Stand Pivot Transfers 4: Min guard  Details for Transfer Assistance Cues for hand placement & technique.  (A) for balance, RW steadiness, controlled descent, & safety.   Ambulation/Gait  Ambulation/Gait Assistance 4: Min assist  Ambulation Distance (Feet) 20 Feet  Assistive device Rolling walker  Ambulation/Gait Assistance Details (A) for balance.  Cues to ensure balance/stability before taking next hop  Gait Pattern Step-to pattern  Stairs Yes  Stairs Assistance 1: +1 Total assist  Stairs Assistance Details (indicate cue type and reason) Attempted step with RW but unable to complete today due to unsteady & unsafe due to balance deficits with NWBing restriction.  Encouraged pt to have grandson use "bumping up" technique in w/c.Marland Kitchen     Stair Management Technique Wheelchair;Backwards  Number of Stairs 1   Engineering geologist Yes  Wheelchair Assistance 5: Supervision  Wheelchair Assistance  Details (indicate cue type and reason) Cues for components of w/c & how to manage w/c.  Pt navigated down hall ~100' with cues for increased awareness of objects in hallway & how to navigate around objects.    Wheelchair Propulsion Both upper extremities;Right lower extremity  Wheelchair Parts Management Needs assistance  Distance 100  PT - End of Session  Equipment Utilized During Treatment Gait belt  Activity Tolerance Patient tolerated treatment well  Patient left in chair;with call bell/phone within reach;with family/visitor present  PT - Assessment/Plan  Comments on Treatment Session Began w/c education today.  Plans are for pt to possibly d/c home tomorrow after HD.  Due to unsteadiness & decreased safety with ascending 1 step backwards with RW  encouraged pt to have grandson bump him up backwards in w/c.    PT Plan Discharge plan remains appropriate  PT Frequency Min 5X/week  Follow Up Recommendations Home health PT;Supervision/Assistance - 24 hour  Equipment Recommended Rolling walker with 5" wheels;Wheelchair (measurements);Other (comment)  Acute Rehab PT Goals  Time For Goal Achievement 12/10/11  Potential to Achieve Goals Fair  PT Goal: Supine/Side to Sit - Progress Met  PT Goal: Sit to Stand - Progress Not met  PT Goal: Stand to Sit - Progress Not met  PT Transfer Goal: Bed to Chair/Chair to Bed - Progress Met  PT Goal: Ambulate - Progress Progressing toward goal  PT Goal: Up/Down Stairs - Progress Progressing toward goal  PT Goal: Propel Wheelchair - Progress Progressing toward goal     Verdell Face, [place] [phone] 12/08/2011

## 2011-12-08 NOTE — Progress Notes (Signed)
CARE MANAGEMENT NOTE 12/08/2011  Patient:  [facility]   Account Number:  [id number]  Date Initiated:  12/08/2011  Documentation initiated by:  Vance Peper  Subjective/Objective Assessment:   77 yr old male s/p left pilon fracture     Action/Plan:   Spoke with patient and wife regarding home health needs. Choice offered.  Wife requested that [employer] deliver DME to pt's room, she doesnt drive. Will contact [employer].   Anticipated DC Date:  12/09/2011   Anticipated DC Plan:  HOME W HOME HEALTH SERVICES      DC Planning Services  CM consult      PAC Choice  DURABLE MEDICAL EQUIPMENT  HOME HEALTH   Choice offered to / List presented to:  C-3 Spouse   DME arranged  WHEELCHAIR - MANUAL  WALKER - ROLLING      DME agency  [facility]     HH arranged  HH-3 OT  HH-2 PT      HH agency  [facility]   Status of service:  Completed, signed off   Discharge Disposition:  HOME W HOME HEALTH SERVICES

## 2011-12-08 NOTE — Evaluation (Signed)
Occupational Therapy Evaluation and Discharge Patient Details Name: [name] [name] MRN: [id number] DOB: Jan 02, 1935 Today's Date: 12/08/2011 Time: 4098-1191 OT Time Calculation (min): 32 min  OT Assessment / Plan / Recommendation Clinical Impression  Pt s/p LLE ex fix with subsequent ORIF after tib/fib fx. Pt presents with pain, generalized weakness and overall decreased independence with ADL. Pt is to d/c tomorrow therefore will not follow acutely. No f/u OT needs at this time. Will need 3n1    OT Assessment  Patient does not need any further OT services    Follow Up Recommendations  No OT follow up;Supervision/Assistance - 24 hour    Barriers to Discharge      Equipment Recommendations  Rolling walker with 5" wheels;Wheelchair (measurements);Other (comment);3 in 1 bedside comode    Recommendations for Other Services    Frequency       Precautions / Restrictions Precautions Precautions: Fall Restrictions Weight Bearing Restrictions: Yes LLE Weight Bearing: Non weight bearing   Pertinent Vitals/Pain Pt with no reports of pain during session.    ADL  Eating/Feeding: Performed;Independent Where Assessed - Eating/Feeding: Chair Grooming: Performed;Wash/dry hands;Teeth care;Set up;Minimal assistance Where Assessed - Grooming: Supported standing (unilateral support) Upper Body Bathing: Simulated;Minimal assistance Where Assessed - Upper Body Bathing: Supported standing Lower Body Bathing: Simulated;Moderate assistance Where Assessed - Lower Body Bathing: Supported sit to stand Lower Body Dressing: Simulated;Moderate assistance Where Assessed - Lower Body Dressing: Sopported sit to stand Toilet Transfer: Simulated;Minimal assistance Toilet Transfer Method: Sit to stand Toilet Transfer Equipment: Bedside commode Toileting - Clothing Manipulation and Hygiene: Simulated;Maximal assistance Where Assessed - Engineer, mining and Hygiene: Sit to stand from 3-in-1 or  toilet Equipment Used: Rolling walker Transfers/Ambulation Related to ADLs: Min guard A with RW- pt hopping. Recommended donning shoe to hop on hard surfaces ADL Comments: wife present and reports she has before and will continue to be able to provide necessary level of assist at home Educated pt and wife on LB dsg methods as well as potential future    OT Diagnosis:    OT Problem List:   OT Treatment Interventions:     OT Goals    Visit Information  Last OT Received On: 12/08/11 Assistance Needed: +1    Subjective Data  Subjective: I can hop around like a bunny rabbit Patient Stated Goal: Return home with wife   Prior Functioning  Home Living Lives With: Spouse Available Help at Discharge: Family;Available 24 hours/day Type of Home: House Home Access: Stairs to enter [employer] of Steps: 1 Entrance Stairs-Rails: None Home Layout: One level Bathroom Shower/Tub: Forensic scientist: Standard Bathroom Accessibility: Yes How Accessible: Accessible via walker Home Adaptive Equipment: Walker - rolling;Straight cane Prior Function Level of Independence: Independent with assistive device(s) Able to Take Stairs?: Yes Driving: No Vocation: Retired Musician: No difficulties Dominant Hand: Right    Cognition  Overall Cognitive Status: Appears within functional limits for tasks assessed/performed Arousal/Alertness: Awake/alert Orientation Level: Appears intact for tasks assessed Behavior During Session: [facility] for tasks performed    Extremity/Trunk Assessment Right Upper Extremity Assessment RUE ROM/Strength/Tone: Within functional levels RUE Sensation: WFL - Light Touch RUE Coordination: WFL - gross/fine motor Left Upper Extremity Assessment LUE ROM/Strength/Tone: Within functional levels LUE Sensation: WFL - Light Touch LUE Coordination: WFL - gross/fine motor   Mobility Bed Mobility Bed Mobility: Not assessed Supine to  Sit: 6: Modified independent (Device/Increase time);HOB flat Sitting - Scoot to Edge of Bed: 6: Modified independent (Device/Increase time) Details for Bed Mobility  Assistance: Increased time to complete Transfers Sit to Stand: 4: Min assist;From chair/3-in-1;With armrests Stand to Sit: 4: Min assist;To chair/3-in-1;With armrests Details for Transfer Assistance: VC for hand placement and sequencing- pt initally with difficulty coming from sit to stand- did much better when given cues to lean and rock forward. Assist to control descent   Exercise    Balance    End of Session OT - End of Session Equipment Utilized During Treatment: Gait belt Activity Tolerance: Patient tolerated treatment well Patient left: in chair;with call bell/phone within reach;with family/visitor present Nurse Communication: Mobility status   Riham Polyakov 12/08/2011, 1:23 PM

## 2011-12-09 ENCOUNTER — Inpatient Hospital Stay (HOSPITAL_COMMUNITY): Payer: Medicare PPO

## 2011-12-09 DIAGNOSIS — I10 Essential (primary) hypertension: Secondary | ICD-10-CM

## 2011-12-09 DIAGNOSIS — S82409A Unspecified fracture of shaft of unspecified fibula, initial encounter for closed fracture: Secondary | ICD-10-CM

## 2011-12-09 DIAGNOSIS — N186 End stage renal disease: Secondary | ICD-10-CM

## 2011-12-09 DIAGNOSIS — S82209A Unspecified fracture of shaft of unspecified tibia, initial encounter for closed fracture: Secondary | ICD-10-CM

## 2011-12-09 LAB — WOUND CULTURE: Culture: NO GROWTH

## 2011-12-09 LAB — RENAL FUNCTION PANEL
CO2: 26 mEq/L (ref 19–32)
Calcium: 8.7 mg/dL (ref 8.4–10.5)
Chloride: 92 mEq/L — ABNORMAL LOW (ref 96–112)
GFR calc Af Amer: 8 mL/min — ABNORMAL LOW (ref >90)
GFR calc non Af Amer: 7 mL/min — ABNORMAL LOW (ref >90)
Glucose, Bld: 100 mg/dL — ABNORMAL HIGH (ref 70–99)
Sodium: 135 mEq/L (ref 135–145)

## 2011-12-09 MED ORDER — ACETAMINOPHEN 325 MG PO TABS
ORAL_TABLET | ORAL | Status: AC
  Administered 2011-12-09: 650 mg via ORAL
  Filled 2011-12-09: qty 2

## 2011-12-09 MED ORDER — HYDROCODONE-ACETAMINOPHEN 5-325 MG PO TABS: 1.0000 | ORAL_TABLET | Freq: Four times a day (QID) | ORAL | Status: AC | PRN

## 2011-12-09 MED ORDER — ACETAMINOPHEN 325 MG PO TABS
650.0000 mg | ORAL_TABLET | Freq: Four times a day (QID) | ORAL | Status: DC | PRN
  Administered 2011-12-09: 650 mg via ORAL

## 2011-12-09 MED ORDER — POLYETHYLENE GLYCOL 3350 17 G PO PACK: 17.0000 g | PACK | Freq: Two times a day (BID) | ORAL | Status: AC | PRN

## 2011-12-09 MED ORDER — DSS 100 MG PO CAPS: 100.0000 mg | ORAL_CAPSULE | Freq: Two times a day (BID) | ORAL | Status: AC | PRN

## 2011-12-09 MED ORDER — TAMSULOSIN HCL 0.4 MG PO CAPS: 0.4000 mg | ORAL_CAPSULE | Freq: Every day | ORAL | Status: DC

## 2011-12-09 NOTE — Progress Notes (Signed)
S: Doing well, on HD with low BP in 90's.  Weighed 95.7kg today, dry wt is 96kg. No complaints.   O:BP 109/55  Pulse 69  Temp(Src) 100 F (37.8 C) (Oral)  Resp 18  Ht 5\' 8"  (1.727 m)  Wt 93.7 kg (206 lb 9.1 oz)  BMI 31.41 kg/m2  SpO2 98% No intake or output data in the 24 hours ending 12/09/11 0853 Weight change:  AOZ:HYQMV and alert CVS:RRR Resp:clear  Abd:+ BS NTND Ext:no edema.  Lt leg bandaged Lt AVF + bruit NEURO:CNI Ox3      . darbepoetin (ARANESP) injection - DIALYSIS  25 mcg Intravenous Q Tue-HD  . docusate sodium  100 mg Oral BID  . feeding supplement (NEPRO CARB STEADY)  237 mL Oral BID BM  . gabapentin  300 mg Oral QHS  . heparin  5,000 Units Subcutaneous Q12H  . lansoprazole  15 mg Oral Q1200  . magic mouthwash  5 mL Oral TID  . metoprolol  25 mg Oral Daily  . multivitamin  1 tablet Oral QHS  . PARoxetine  30 mg Oral Daily  . polyethylene glycol  17 g Oral BID  . senna  1 tablet Oral BID  . sevelamer  1,600 mg Oral TID WC  . Tamsulosin HCl  0.4 mg Oral Daily   Dg Ankle Complete Left  12/08/2011  *RADIOLOGY REPORT*  Clinical Data: Postop ankle fracture.  LEFT ANKLE COMPLETE - 3+ VIEW  Comparison: Intraoperative imaging 12/07/2011.  Preoperative imaging 12/01/2011.  Findings: Plate and screw fixation of the distal tibia and fibula noted.  There is anatomic alignment.  No complicating feature. Overlying cast material obscures fine bony detail.  IMPRESSION: Internal fixation of the distal left tibia and fibula with anatomic alignment.  No complicating feature.  Original Report Authenticated By: Cyndie Chime, M.D.   Dg Ankle Complete Left  12/07/2011  *RADIOLOGY REPORT*  Clinical Data:  Ankle fracture  DG C-ARM 61-120 MIN,LEFT ANKLE COMPLETE - 3+ VIEW  Comparison:  Multiple priors  Findings: The patient's is status post removal of external fixation hardware and the open reduction internal fixation of distal tibia and fibular fractures using plate and screw fixation.  Improved position and alignment.  IMPRESSION: As above.  Original Report Authenticated By: Elsie Stain, M.D.   Dg C-arm 61-120 Min  12/07/2011  *RADIOLOGY REPORT*  Clinical Data:  Ankle fracture  DG C-ARM 61-120 MIN,LEFT ANKLE COMPLETE - 3+ VIEW  Comparison:  Multiple priors  Findings: The patient's is status post removal of external fixation hardware and the open reduction internal fixation of distal tibia and fibular fractures using plate and screw fixation. Improved position and alignment.  IMPRESSION: As above.  Original Report Authenticated By: Elsie Stain, M.D.   BMET    Component Value Date/Time   NA 135 12/09/2011 0555   K 3.6 12/09/2011 0555   CL 92* 12/09/2011 0555   CO2 26 12/09/2011 0555   GLUCOSE 100* 12/09/2011 0555   BUN 47* 12/09/2011 0555   CREATININE 6.81* 12/09/2011 0555   CALCIUM 8.7 12/09/2011 0555   CALCIUM 7.7* 12/05/2011 1148   GFRNONAA 7* 12/09/2011 0555   GFRAA 8* 12/09/2011 0555   CBC    Component Value Date/Time   WBC 8.1 12/08/2011 0514   RBC 3.13* 12/08/2011 0514   HGB 10.3* 12/08/2011 0514   HCT 30.6* 12/08/2011 0514   PLT 152 12/08/2011 0514   MCV 97.8 12/08/2011 0514   MCH 32.9 12/08/2011 0514   MCHC  33.7 12/08/2011 0514   RDW 13.7 12/08/2011 0514     Assessment:  1. ESRD / TTS [facility]- on dialysis now. Below dry wt with low BP, keep even w HD. 2. Sp Fx Lt ankle- per ortho/primary 3. Anemia 4. HTN/volume- not on any BP medications here, was on metoprolol 25 mg/d at home  Plan: 1. HD now  Vinson Moselle  MD [facility] [phone] pgr    [phone] cell 12/09/2011, 8:56 AM

## 2011-12-09 NOTE — Progress Notes (Signed)
PT Cancellation Note  Treatment cancelled today due to pt just back from hemo and declined OOB.  Pt reports he is being d/c'd this pm.  Wife present and states no concerns for PT at this time.  Verbally reviewed in/out of car.  Will f/u 6/9 if pt still here.  Newell Coral 12/09/2011, 1:21 PM  Newell Coral, PTA Acute Rehab [phone] (office)

## 2011-12-09 NOTE — Discharge Summary (Signed)
Physician Discharge Summary  Patient ID: [name] [name] MRN: [id number] DOB/AGE: [birth year] 77 y.o.  Admit date: 12/01/2011 Discharge date: 12/09/2011  Primary Care Physician:  Louie Boston, MD, MD  Orthopedics: Dr.Handy  Discharge Diagnoses:   1. Left ankle fracture s/p Ext fixation followed by ORIF 2. ESRD on HD TTS 3. DM (diabetes mellitus) 4. Neuropathy 5. Anemia of chronic disease 6. Hyperparathyroidism due to renal insufficiency 7. H/o Renal cell carcinoma s/p R nephrectomy 8. H/o Gout 9. Urinary retention  Medication List  As of 12/09/2011  2:24 PM   TAKE these medications         acetaminophen 325 MG tablet   Commonly known as: TYLENOL   Take 650 mg by mouth every 6 (six) hours as needed. For pain      acetaminophen 500 MG tablet   Commonly known as: TYLENOL   Take 1-2 tablets (500-1,000 mg total) by mouth every 6 (six) hours as needed for pain or fever.      allopurinol 100 MG tablet   Commonly known as: ZYLOPRIM   Take 200 mg by mouth daily.      aspirin EC 325 MG tablet   Take 1 tablet (325 mg total) by mouth daily.      calcium acetate 667 MG capsule   Commonly known as: PHOSLO   Take 1,334 mg by mouth 3 (three) times daily with meals.      DSS 100 MG Caps   Take 100 mg by mouth 2 (two) times daily as needed for constipation.      gabapentin 300 MG capsule   Commonly known as: NEURONTIN   Take 300 mg by mouth at bedtime as needed. For pain      HYDROcodone-acetaminophen 5-325 MG per tablet   Commonly known as: NORCO   Take 1-2 tablets by mouth every 6 (six) hours as needed.      lansoprazole 15 MG capsule   Commonly known as: PREVACID   Take 15 mg by mouth daily.      metoprolol 50 MG tablet   Commonly known as: LOPRESSOR   Take 25 mg by mouth daily.      multivitamin Tabs tablet   Take 1 tablet by mouth daily.      PARoxetine 30 MG tablet   Commonly known as: PAXIL   Take 30 mg by mouth every morning.      polyethylene glycol packet   Commonly known as: MIRALAX / GLYCOLAX   Take 17 g by mouth 2 (two) times daily as needed (constipation).      sevelamer 800 MG tablet   Commonly known as: RENAGEL   Take 1,600 mg by mouth 3 (three) times daily with meals.      Tamsulosin HCl 0.4 MG Caps   Commonly known as: FLOMAX   Take 1 capsule (0.4 mg total) by mouth daily.             Disposition and Follow-up:  Dr.Handy in 2 weeks  Consults:   1. Dr.Handy, Orthopedics 2. Dr.Schertz, Renal  Significant Diagnostic Studies:  Dg Ankle Complete Left  12/08/2011  *RADIOLOGY REPORT*  Clinical Data: Postop ankle fracture.  LEFT ANKLE COMPLETE - 3+ VIEW  Comparison: Intraoperative imaging 12/07/2011.  Preoperative imaging 12/01/2011.  Findings: Plate and screw fixation of the distal tibia and fibula noted.  There is anatomic alignment.  No complicating feature. Overlying cast material obscures fine bony detail.  IMPRESSION: Internal fixation of the distal left tibia and fibula with anatomic  alignment.  No complicating feature.  Original Report Authenticated By: Cyndie Chime, M.D.    Brief H and P: 23HYQ with h/o remote renal cell ca s/p right nephrectomy, now ESRD  on HD (T/T/S), borderline DM, HTN, no known or reported cardiac  disease or strokes, and recent left ankle fracture s/p screw  placement ~09/2011, now presents with distal tib/fib fracture and  loosened ankle hardware.  Pt was walking to the drug store today when he felt an acute pop in  his left ankle with pain. He was able to make it to drug store,  then got a ride home, where his wife made him come to the hospital.  At [facility], plain film then CT ankle showed lucency around the  screws concering for infxn or loosened hardware, and fractures of  both distal tib and fib.  Hospital Course:  1. R ankle fracture with loosened hardware: Initially underwent Ext fixation, subsequently underwent ORIF on 12/07/11, tolerated the procedure well Closed L pilon fracture with  syndesmotic disruption  POD 1  NWB x 8 weeks  Continue with splint until follow up visit  PT/OT 2. ESRD: continued on dialysis, TTS throughout hospitalization Rest of chronic medical problems remained stable  Time spent on Discharge:  Signed: Karrie Fluellen [facility]  12/09/2011, 2:24 PM

## 2011-12-09 NOTE — Procedures (Signed)
I was present at this dialysis session. I have reviewed the session itself and made appropriate changes.   Vinson Moselle, MD [employer] 12/09/2011, 8:57 AM

## 2011-12-12 LAB — ANAEROBIC CULTURE

## 2011-12-14 LAB — GLUCOSE, CAPILLARY
Glucose-Capillary: 120 mg/dL — ABNORMAL HIGH (ref 70–99)
Glucose-Capillary: 101 mg/dL — ABNORMAL HIGH (ref 70–99)

## 2012-01-02 ENCOUNTER — Inpatient Hospital Stay (HOSPITAL_COMMUNITY)
Admission: RE | Admit: 2012-01-02 | Discharge: 2012-01-04 | DRG: 602 | Disposition: A | Discharge status: Home / Self Care | Payer: Medicare PPO | Source: Other Acute Inpatient Hospital | Attending: Internal Medicine | Admitting: Internal Medicine

## 2012-01-02 DIAGNOSIS — N039 Chronic nephritic syndrome with unspecified morphologic changes: Secondary | ICD-10-CM | POA: Diagnosis present

## 2012-01-02 DIAGNOSIS — R0600 Dyspnea, unspecified: Secondary | ICD-10-CM

## 2012-01-02 DIAGNOSIS — L02419 Cutaneous abscess of limb, unspecified: Principal | ICD-10-CM | POA: Diagnosis present

## 2012-01-02 DIAGNOSIS — Z905 Acquired absence of kidney: Secondary | ICD-10-CM

## 2012-01-02 DIAGNOSIS — Z992 Dependence on renal dialysis: Secondary | ICD-10-CM

## 2012-01-02 DIAGNOSIS — S82872A Displaced pilon fracture of left tibia, initial encounter for closed fracture: Secondary | ICD-10-CM

## 2012-01-02 DIAGNOSIS — D631 Anemia in chronic kidney disease: Secondary | ICD-10-CM | POA: Diagnosis present

## 2012-01-02 DIAGNOSIS — L03116 Cellulitis of left lower limb: Secondary | ICD-10-CM | POA: Diagnosis present

## 2012-01-02 DIAGNOSIS — E8889 Other specified metabolic disorders: Secondary | ICD-10-CM | POA: Diagnosis present

## 2012-01-02 DIAGNOSIS — Z85528 Personal history of other malignant neoplasm of kidney: Secondary | ICD-10-CM

## 2012-01-02 DIAGNOSIS — N186 End stage renal disease: Secondary | ICD-10-CM | POA: Diagnosis present

## 2012-01-02 DIAGNOSIS — E1149 Type 2 diabetes mellitus with other diabetic neurological complication: Secondary | ICD-10-CM | POA: Diagnosis present

## 2012-01-02 DIAGNOSIS — E1142 Type 2 diabetes mellitus with diabetic polyneuropathy: Secondary | ICD-10-CM | POA: Diagnosis present

## 2012-01-02 DIAGNOSIS — N2581 Secondary hyperparathyroidism of renal origin: Secondary | ICD-10-CM | POA: Diagnosis present

## 2012-01-02 DIAGNOSIS — L03119 Cellulitis of unspecified part of limb: Principal | ICD-10-CM | POA: Diagnosis present

## 2012-01-02 DIAGNOSIS — G629 Polyneuropathy, unspecified: Secondary | ICD-10-CM | POA: Diagnosis present

## 2012-01-02 DIAGNOSIS — E119 Type 2 diabetes mellitus without complications: Secondary | ICD-10-CM

## 2012-01-02 DIAGNOSIS — D649 Anemia, unspecified: Secondary | ICD-10-CM | POA: Diagnosis present

## 2012-01-02 DIAGNOSIS — I120 Hypertensive chronic kidney disease with stage 5 chronic kidney disease or end stage renal disease: Secondary | ICD-10-CM | POA: Diagnosis present

## 2012-01-03 ENCOUNTER — Encounter (HOSPITAL_COMMUNITY): Payer: Self-pay | Admitting: Internal Medicine

## 2012-01-03 ENCOUNTER — Inpatient Hospital Stay (HOSPITAL_COMMUNITY): Payer: Medicare PPO

## 2012-01-03 DIAGNOSIS — E119 Type 2 diabetes mellitus without complications: Secondary | ICD-10-CM

## 2012-01-03 DIAGNOSIS — L03116 Cellulitis of left lower limb: Secondary | ICD-10-CM | POA: Diagnosis present

## 2012-01-03 DIAGNOSIS — R0602 Shortness of breath: Secondary | ICD-10-CM

## 2012-01-03 DIAGNOSIS — N186 End stage renal disease: Secondary | ICD-10-CM

## 2012-01-03 DIAGNOSIS — L02419 Cutaneous abscess of limb, unspecified: Principal | ICD-10-CM

## 2012-01-03 DIAGNOSIS — N039 Chronic nephritic syndrome with unspecified morphologic changes: Secondary | ICD-10-CM

## 2012-01-03 DIAGNOSIS — D631 Anemia in chronic kidney disease: Secondary | ICD-10-CM | POA: Diagnosis present

## 2012-01-03 DIAGNOSIS — S82899A Other fracture of unspecified lower leg, initial encounter for closed fracture: Secondary | ICD-10-CM

## 2012-01-03 DIAGNOSIS — L03119 Cellulitis of unspecified part of limb: Secondary | ICD-10-CM

## 2012-01-03 DIAGNOSIS — M7989 Other specified soft tissue disorders: Secondary | ICD-10-CM

## 2012-01-03 DIAGNOSIS — N189 Chronic kidney disease, unspecified: Secondary | ICD-10-CM

## 2012-01-03 DIAGNOSIS — R0989 Other specified symptoms and signs involving the circulatory and respiratory systems: Secondary | ICD-10-CM

## 2012-01-03 LAB — CBC
HCT: 30.7 % — ABNORMAL LOW (ref 39.0–52.0)
MCH: 32.3 pg (ref 26.0–34.0)
MCV: 97.2 fL (ref 78.0–100.0)
RDW: 15.2 % (ref 11.5–15.5)
WBC: 8.8 10*3/uL (ref 4.0–10.5)

## 2012-01-03 LAB — CARDIAC PANEL(CRET KIN+CKTOT+MB+TROPI)
Relative Index: INVALID (ref 0.0–2.5)
Total CK: 34 U/L (ref 7–232)
Troponin I: <0.3 ng/mL (ref <0.30)

## 2012-01-03 LAB — COMPREHENSIVE METABOLIC PANEL
ALT: 8 U/L (ref 0–53)
AST: 19 U/L (ref 0–37)
Albumin: 2.9 g/dL — ABNORMAL LOW (ref 3.5–5.2)
Alkaline Phosphatase: 108 U/L (ref 39–117)
Chloride: 102 mEq/L (ref 96–112)
Potassium: 3.7 mEq/L (ref 3.5–5.1)
Sodium: 141 mEq/L (ref 135–145)
Total Bilirubin: 0.5 mg/dL (ref 0.3–1.2)
Total Protein: 5.9 g/dL — ABNORMAL LOW (ref 6.0–8.3)

## 2012-01-03 LAB — GLUCOSE, CAPILLARY
Glucose-Capillary: 121 mg/dL — ABNORMAL HIGH (ref 70–99)
Glucose-Capillary: 87 mg/dL (ref 70–99)

## 2012-01-03 MED ORDER — ZOLPIDEM TARTRATE 5 MG PO TABS: 5.0000 mg | ORAL_TABLET | Freq: Every evening | ORAL | Status: DC | PRN

## 2012-01-03 MED ORDER — LANSOPRAZOLE 15 MG PO CPDR
15.0000 mg | DELAYED_RELEASE_CAPSULE | ORAL | Status: DC
  Administered 2012-01-04: 15 mg via ORAL

## 2012-01-03 MED ORDER — DOCUSATE SODIUM 283 MG RE ENEM: 1.0000 | ENEMA | RECTAL | Status: DC | PRN

## 2012-01-03 MED ORDER — ACETAMINOPHEN 325 MG PO TABS: 650.0000 mg | ORAL_TABLET | Freq: Four times a day (QID) | ORAL | Status: DC | PRN

## 2012-01-03 MED ORDER — ONDANSETRON HCL 4 MG PO TABS: 4.0000 mg | ORAL_TABLET | Freq: Four times a day (QID) | ORAL | Status: DC | PRN

## 2012-01-03 MED ORDER — ACETAMINOPHEN 650 MG RE SUPP: 650.0000 mg | Freq: Four times a day (QID) | RECTAL | Status: DC | PRN

## 2012-01-03 MED ORDER — HYDROCODONE-ACETAMINOPHEN 5-325 MG PO TABS: 1.0000 | ORAL_TABLET | ORAL | Status: DC | PRN

## 2012-01-03 MED ORDER — NEPRO/CARBSTEADY PO LIQD: 237.0000 mL | Freq: Three times a day (TID) | ORAL | Status: DC | PRN

## 2012-01-03 MED ORDER — TAMSULOSIN HCL 0.4 MG PO CAPS
0.4000 mg | ORAL_CAPSULE | Freq: Every day | ORAL | Status: DC
  Administered 2012-01-03 – 2012-01-04 (×2): 0.4 mg via ORAL
  Filled 2012-01-03 (×3): qty 1

## 2012-01-03 MED ORDER — XENON XE 133 GAS
17.0000 | GAS_FOR_INHALATION | Freq: Once | RESPIRATORY_TRACT | Status: AC | PRN
  Administered 2012-01-03: 17 via RESPIRATORY_TRACT

## 2012-01-03 MED ORDER — GABAPENTIN 300 MG PO CAPS
300.0000 mg | ORAL_CAPSULE | Freq: Every day | ORAL | Status: DC
  Administered 2012-01-04: 300 mg via ORAL
  Filled 2012-01-03 (×4): qty 1

## 2012-01-03 MED ORDER — ONDANSETRON HCL 4 MG/2ML IJ SOLN: 4.0000 mg | Freq: Four times a day (QID) | INTRAMUSCULAR | Status: DC | PRN

## 2012-01-03 MED ORDER — SORBITOL 70 % SOLN: 30.0000 mL | Status: DC | PRN

## 2012-01-03 MED ORDER — ALLOPURINOL 100 MG PO TABS
200.0000 mg | ORAL_TABLET | Freq: Every day | ORAL | Status: DC
  Administered 2012-01-03 – 2012-01-04 (×2): 200 mg via ORAL
  Filled 2012-01-03 (×3): qty 2

## 2012-01-03 MED ORDER — HEPARIN SODIUM (PORCINE) 1000 UNIT/ML DIALYSIS
20.0000 [IU]/kg | INTRAMUSCULAR | Status: DC | PRN
  Filled 2012-01-03: qty 2

## 2012-01-03 MED ORDER — CALCIUM ACETATE 667 MG PO CAPS
1334.0000 mg | ORAL_CAPSULE | Freq: Three times a day (TID) | ORAL | Status: DC
  Administered 2012-01-03 (×2): 1334 mg via ORAL
  Filled 2012-01-03 (×8): qty 2

## 2012-01-03 MED ORDER — VANCOMYCIN HCL 1000 MG IV SOLR
750.0000 mg | INTRAVENOUS | Status: DC
  Administered 2012-01-04: 750 mg via INTRAVENOUS
  Filled 2012-01-03 (×3): qty 750

## 2012-01-03 MED ORDER — SODIUM CHLORIDE 0.9 % IV SOLN
125.0000 mg | INTRAVENOUS | Status: DC
  Administered 2012-01-04: 125 mg via INTRAVENOUS
  Filled 2012-01-03 (×2): qty 10

## 2012-01-03 MED ORDER — NEPRO/CARBSTEADY PO LIQD
237.0000 mL | Freq: Two times a day (BID) | ORAL | Status: DC
  Administered 2012-01-04: 237 mL via ORAL

## 2012-01-03 MED ORDER — CAMPHOR-MENTHOL 0.5-0.5 % EX LOTN
1.0000 "application " | TOPICAL_LOTION | Freq: Three times a day (TID) | CUTANEOUS | Status: DC | PRN
  Filled 2012-01-03: qty 222

## 2012-01-03 MED ORDER — SEVELAMER HCL 800 MG PO TABS
1600.0000 mg | ORAL_TABLET | Freq: Three times a day (TID) | ORAL | Status: DC
  Administered 2012-01-03 – 2012-01-04 (×3): 1600 mg via ORAL
  Filled 2012-01-03 (×7): qty 2

## 2012-01-03 MED ORDER — PIPERACILLIN-TAZOBACTAM IN DEX 2-0.25 GM/50ML IV SOLN
2.2500 g | Freq: Three times a day (TID) | INTRAVENOUS | Status: DC
  Administered 2012-01-03: 2.25 g via INTRAVENOUS
  Filled 2012-01-03 (×3): qty 50

## 2012-01-03 MED ORDER — DARBEPOETIN ALFA-POLYSORBATE 60 MCG/0.3ML IJ SOLN
60.0000 ug | INTRAMUSCULAR | Status: DC
  Administered 2012-01-04 (×2): 60 ug via INTRAVENOUS
  Filled 2012-01-03: qty 0.3

## 2012-01-03 MED ORDER — PAROXETINE HCL 30 MG PO TABS
30.0000 mg | ORAL_TABLET | ORAL | Status: DC
  Administered 2012-01-03 – 2012-01-04 (×2): 30 mg via ORAL
  Filled 2012-01-03 (×3): qty 1

## 2012-01-03 MED ORDER — SODIUM CHLORIDE 0.9 % IJ SOLN
3.0000 mL | Freq: Two times a day (BID) | INTRAMUSCULAR | Status: DC
  Administered 2012-01-03 – 2012-01-04 (×4): 3 mL via INTRAVENOUS

## 2012-01-03 MED ORDER — ASPIRIN EC 325 MG PO TBEC
325.0000 mg | DELAYED_RELEASE_TABLET | Freq: Every day | ORAL | Status: DC
  Administered 2012-01-03: 325 mg via ORAL
  Filled 2012-01-03 (×4): qty 1

## 2012-01-03 MED ORDER — PANTOPRAZOLE SODIUM 20 MG PO TBEC
20.0000 mg | DELAYED_RELEASE_TABLET | Freq: Every day | ORAL | Status: DC
  Filled 2012-01-03: qty 1

## 2012-01-03 MED ORDER — CALCIUM CARBONATE 1250 MG/5ML PO SUSP: 500.0000 mg | Freq: Four times a day (QID) | ORAL | Status: DC | PRN

## 2012-01-03 MED ORDER — HYDROXYZINE HCL 25 MG PO TABS: 25.0000 mg | ORAL_TABLET | Freq: Three times a day (TID) | ORAL | Status: DC | PRN

## 2012-01-03 MED ORDER — TECHNETIUM TO 99M ALBUMIN AGGREGATED
3.0000 | Freq: Once | INTRAVENOUS | Status: AC | PRN
  Administered 2012-01-03: 3 via INTRAVENOUS

## 2012-01-03 MED ORDER — RENA-VITE PO TABS
1.0000 | ORAL_TABLET | Freq: Every day | ORAL | Status: DC
  Administered 2012-01-03 – 2012-01-04 (×2): 1 via ORAL
  Filled 2012-01-03 (×3): qty 1

## 2012-01-03 MED ORDER — HEPARIN SODIUM (PORCINE) 5000 UNIT/ML IJ SOLN
5000.0000 [IU] | Freq: Three times a day (TID) | INTRAMUSCULAR | Status: DC
  Administered 2012-01-03 – 2012-01-04 (×3): 5000 [IU] via SUBCUTANEOUS
  Filled 2012-01-03 (×7): qty 1

## 2012-01-03 MED ORDER — METOPROLOL TARTRATE 25 MG PO TABS
25.0000 mg | ORAL_TABLET | Freq: Every day | ORAL | Status: DC
  Administered 2012-01-03 – 2012-01-04 (×2): 25 mg via ORAL
  Filled 2012-01-03 (×3): qty 1

## 2012-01-03 MED ORDER — ALBUTEROL SULFATE (5 MG/ML) 0.5% IN NEBU
2.5000 mg | INHALATION_SOLUTION | RESPIRATORY_TRACT | Status: DC | PRN
  Filled 2012-01-03: qty 0.5

## 2012-01-03 MED ORDER — VANCOMYCIN HCL 500 MG IV SOLR
500.0000 mg | Freq: Once | INTRAVENOUS | Status: AC
  Administered 2012-01-03: 500 mg via INTRAVENOUS
  Filled 2012-01-03: qty 500

## 2012-01-03 MED ORDER — ACETAMINOPHEN 325 MG PO TABS
650.0000 mg | ORAL_TABLET | Freq: Four times a day (QID) | ORAL | Status: DC | PRN
  Administered 2012-01-03: 650 mg via ORAL
  Administered 2012-01-03: 325 mg via ORAL
  Filled 2012-01-03: qty 2
  Filled 2012-01-03: qty 1

## 2012-01-03 NOTE — Progress Notes (Signed)
ANTIBIOTIC CONSULT NOTE - INITIAL  Pharmacy Consult for vancomycin and zosyn  Indication: cellulitis   Allergies  Allergen Reactions  . Ivp Dye (Iodinated Diagnostic Agents)     Unknown  . Ofloxacin Nausea And Vomiting  . Oxycodone     Altered mental status  . Sulfa Antibiotics Rash  . Sulfamethoxazole W-Trimethoprim Rash    Patient Measurements: Weight: 217 lb 13 oz (98.8 kg) Adjusted Body Weight:   Vital Signs: Temp: 98.2 F (36.8 C) (07/03 0000) Temp src: Oral (07/03 0000) BP: 173/82 mmHg (07/03 0000) Pulse Rate: 63  (07/03 0000) Intake/Output from previous day: 07/02 0701 - 07/03 0700 In: 120 [P.O.:120] Out: -  Intake/Output from this shift: Total I/O In: 120 [P.O.:120] Out: -   Labs: No results found for this basename: WBC:3,HGB:3,PLT:3,LABCREA:3,CREATININE:3 in the last 72 hours The CrCl is unknown because both a height and weight (above a minimum accepted value) are required for this calculation. No results found for this basename: VANCOTROUGH:2,VANCOPEAK:2,VANCORANDOM:2,GENTTROUGH:2,GENTPEAK:2,GENTRANDOM:2,TOBRATROUGH:2,TOBRAPEAK:2,TOBRARND:2,AMIKACINPEAK:2,AMIKACINTROU:2,AMIKACIN:2, in the last 72 hours   Microbiology: Recent Results (from the past 720 hour(s))  ANAEROBIC CULTURE     Status: Normal   Collection Time   12/07/11  8:36 AM      Component Value Range Status Comment   Specimen Description WOUND ANKLE LEFT   Final    Special Requests NONE   Final    Gram Stain     Final    Value: RARE WBC PRESENT,BOTH PMN AND MONONUCLEAR     NO ORGANISMS SEEN   Culture NO ANAEROBES ISOLATED   Final    Report Status 12/12/2011 FINAL   Final   GRAM STAIN     Status: Normal   Collection Time   12/07/11  8:36 AM      Component Value Range Status Comment   Specimen Description WOUND ANKLE LEFT   Final    Special Requests NONE   Final    Gram Stain     Final    Value: RARE WBC PRESENT, PREDOMINANTLY MONONUCLEAR     NO ORGANISMS SEEN     CALLED TO OR 12/07/11 0912  BY K SCHULTZ   Report Status 12/07/2011 FINAL   Final   WOUND CULTURE     Status: Normal   Collection Time   12/07/11  8:36 AM      Component Value Range Status Comment   Specimen Description WOUND ANKLE LEFT   Final    Special Requests NONE   Final    Gram Stain     Final    Value: RARE WBC PRESENT,BOTH PMN AND MONONUCLEAR     NO ORGANISMS SEEN     Performed at [facility] Gram Stain Report Called to,Read Back By and Verified With: Gram Stain Report Called to,Read Back By and Verified With: OR 12/07/11 0912 BY SCHULTZ   Culture NO GROWTH 2 DAYS   Final    Report Status 12/09/2011 FINAL   Final     Medical History: Past Medical History  Diagnosis Date  . ESRD on dialysis     Left fistula   . Ankle fracture     Left ~08/2011. [place] fracture  . DM (diabetes mellitus)     Per pt, borderline   . Renal cell cancer     Remote. S/p right nephrectomy.     Medications:  Prescriptions prior to admission  Medication Sig Dispense Refill  . acetaminophen (TYLENOL) 325 MG tablet Take 650 mg by mouth every 6 (six) hours as needed.  For pain      . allopurinol (ZYLOPRIM) 100 MG tablet Take 200 mg by mouth daily.      Marland Kitchen aspirin EC 325 MG tablet Take 1 tablet (325 mg total) by mouth daily.  30 tablet  0  . calcium acetate (PHOSLO) 667 MG capsule Take 667-1,334 mg by mouth 3 (three) times daily with meals. Takes 2 with meals, takes 1 with snacks      . gabapentin (NEURONTIN) 300 MG capsule Take 300 mg by mouth at bedtime as needed. For pain      . lansoprazole (PREVACID) 15 MG capsule Take 15 mg by mouth daily. Sometimes takes a 2nd dose      . metoprolol (LOPRESSOR) 50 MG tablet Take 25 mg by mouth daily.      . multivitamin (RENA-VIT) TABS tablet Take 1 tablet by mouth daily.      Marland Kitchen PARoxetine (PAXIL) 30 MG tablet Take 30 mg by mouth every morning.      . sevelamer (RENAGEL) 800 MG tablet Take 1,600 mg by mouth 3 (three) times daily with meals.      . Tamsulosin HCl (FLOMAX) 0.4 MG  CAPS Take 1 capsule (0.4 mg total) by mouth daily.  30 capsule  0   Assessment: Left ankle infection in 77 yo male with hx esrd htn and previous ankle fx's with previous irrigation and debridement to begin vanc and zosyn  Goal of Therapy:  Vancomycin trough level 10-15 mcg/ml   Plan:  Zosyn 2.25 qm q8h  Repeat 500 mg vancomycin now then 750mg  after each HD session T-th-sat   Janice Coffin 01/03/2012,1:31 AM

## 2012-01-03 NOTE — Progress Notes (Signed)
Physical Therapy Evaluation Patient Details Name: [name] [name] MRN: [id number] DOB: Jun 06, 1935 Today's Date: 01/03/2012 Time: 1430-1450 PT Time Calculation (min): 20 min  PT Assessment / Plan / Recommendation Clinical Impression  77 yo male admitted with L ankle cellulitis overall moving pretty well, and managing NWB LLE well, with some need for cues for safety, and will benefit from some stair training;   Of note, session conducted on 2 L O2 as pt reports some SOB for the past few days; will plan to assess activity tol off O2 in ensuing sessions    PT Assessment  Patient needs continued PT services    Follow Up Recommendations  Home health PT;Supervision/Assistance - 24 hour    Barriers to Discharge        Equipment Recommendations  3 in 1 bedside comode (pt my already have)    Recommendations for Other Services     Frequency Min 3X/week    Precautions / Restrictions Precautions Precautions: Fall Restrictions LLE Weight Bearing: Non weight bearing   Pertinent Vitals/Pain L ankle soreness with donning socks; did not rate; elevated extremity      Mobility  Bed Mobility Bed Mobility: Not assessed (EOB upon entry) Transfers Transfers: Sit to Stand;Stand to Sit;Stand Pivot Transfers Sit to Stand: 4: Min assist;With upper extremity assist;From bed (and from wheelchair) Stand to Sit: 4: Min guard;With upper extremity assist;To chair/3-in-1 (and wheelchair) Stand Pivot Transfers: 4: Min assist Details for Transfer Assistance: Cues for NWB LLE, safety, handplacement; physical assist for RW management/proximity during stand piivot transfer Ambulation/Gait Ambulation/Gait Assistance: 4: Min assist;4: Min guard Ambulation Distance (Feet): 20 Feet Assistive device: Rolling walker Ambulation/Gait Assistance Details: Cues for NWB LLE, which pt was able to manage quite well; Small steps, able  to press body weight into RW for advancing R foot, able to avoid excessive  "hopping" Stairs: No Wheelchair Mobility Wheelchair Mobility: No    Exercises  Pt correctly performed ankle pumps and reports he will continue to perform throughout day   PT Diagnosis: Difficulty walking;Acute pain  PT Problem List: Decreased strength;Decreased activity tolerance;Decreased balance;Decreased mobility;Decreased knowledge of use of DME;Decreased safety awareness;Cardiopulmonary status limiting activity PT Treatment Interventions: DME instruction;Gait training;Stair training;Functional mobility training;Therapeutic activities;Therapeutic exercise;Balance training;Patient/family education   PT Goals Acute Rehab PT Goals PT Goal Formulation: With patient Time For Goal Achievement: 01/17/12 Potential to Achieve Goals: Good Pt will go Supine/Side to Sit: with modified independence PT Goal: Supine/Side to Sit - Progress: Goal set today Pt will go Sit to Supine/Side: with modified independence PT Goal: Sit to Supine/Side - Progress: Goal set today Pt will go Sit to Stand: with modified independence PT Goal: Sit to Stand - Progress: Goal set today Pt will go Stand to Sit: with modified independence PT Goal: Stand to Sit - Progress: Goal set today Pt will Ambulate: >150 feet;with modified independence;with rolling walker PT Goal: Ambulate - Progress: Goal set today Pt will Go Up / Down Stairs: 1-2 stairs;with supervision;with rolling walker PT Goal: Up/Down Stairs - Progress: Goal set today Pt will Perform Home Exercise Program: Independently PT Goal: Perform Home Exercise Program - Progress: Goal set today  Visit Information  Last PT Received On: 01/03/12 Assistance Needed: +1    Subjective Data  Subjective: "I don't think I can do that today" re: walking with RW Patient Stated Goal: home   Prior Functioning  Home Living Lives With: Spouse Available Help at Discharge: Available 24 hours/day Type of Home: House Home Access: Stairs to enter [employer]  of  Steps: 1 Entrance Stairs-Rails: None Home Layout: One level Bathroom Shower/Tub: Tub/shower unit Home Adaptive Equipment: Wheelchair - manual;Walker - rolling Prior Function Level of Independence: Needs assistance Needs Assistance: Bathing;Dressing;Meal Prep;Light Housekeeping;Gait;Transfers Bath: Moderate Dressing: Minimal Meal Prep: Total Light Housekeeping: Total Gait Assistance: Supervision with RW Transfer Assistance: [employer] Communication Communication: No difficulties    Cognition  Overall Cognitive Status: Appears within functional limits for tasks assessed/performed Arousal/Alertness: Awake/alert Orientation Level: Appears intact for tasks assessed Behavior During Session: [facility] for tasks performed    Extremity/Trunk Assessment Right Upper Extremity Assessment RUE ROM/Strength/Tone: Within functional levels Left Upper Extremity Assessment LUE ROM/Strength/Tone: Within functional levels Right Lower Extremity Assessment RLE ROM/Strength/Tone: Deficits RLE ROM/Strength/Tone Deficits: Some minimal generalized weakness noted, with dependence on UE push with transfers Left Lower Extremity Assessment LLE ROM/Strength/Tone: Deficits LLE ROM/Strength/Tone Deficits: NWB LLE; Ankle range grossly 5deg past neutral into dorsiflexion, 25deg into planatrflexion Trunk Assessment Trunk Assessment: Normal   Balance    End of Session PT - End of Session Equipment Utilized During Treatment: Gait belt;Oxygen Activity Tolerance: Patient tolerated treatment well Patient left: in chair;with call bell/phone within reach;with family/visitor present Nurse Communication: Mobility status;Weight bearing status  GP     Olen Pel Olla, [place] [phone]  01/03/2012, 3:45 PM

## 2012-01-03 NOTE — Consult Note (Signed)
[facility] Renal Consultation Note    Indication for Consultation:  Management of ESRD/hemodialysis; anemia, hypertension/volume and secondary hyperparathyroidism  HPI: [name] [name] is a delightful 77 y.o. male with ESRD who dialyzes TTS with [facility] and is followed by Dr. Fausto Skillern.  He has a hx of renal cell CA s/p right nephrectomy, borderline DM, HTN, and hx of left ankle fracture s/p screw placement in 09/2011 and subsequent hardware removal 6/01 and left ankle fx with ORIF 12/07/2011.  He presented to [facility] ED yesterday after dialysis (HD staff did not know this) with increase pain and erythema in the left ankle. He denies fever, chills, nausea, vomiting, SOB and chest pain. He was transferred here per recommendation of orthopedics for evaluation and management.  Past Medical History  Diagnosis Date  . ESRD on dialysis     Left fistula   . Ankle fracture     Left ~08/2011. [place] fracture  . DM (diabetes mellitus)     Per pt, borderline   . Renal cell cancer     Remote. S/p right nephrectomy.    Past Surgical History  Procedure Date  . External fixation leg 12/02/2011    Procedure: EXTERNAL FIXATION LEG;  Surgeon: Velna Ochs, MD;  Location: [facility] OR;  Service: Orthopedics;  Laterality: Left;  . I&d extremity 12/02/2011    Procedure: IRRIGATION AND DEBRIDEMENT EXTREMITY;  Surgeon: Velna Ochs, MD;  Location: [facility] OR;  Service: Orthopedics;  Laterality: Left;  . Hardware removal 12/02/2011    Procedure: HARDWARE REMOVAL;  Surgeon: Velna Ochs, MD;  Location: [facility] OR;  Service: Orthopedics;  Laterality: Left;  . Orif ankle fracture 12/07/2011    Procedure: OPEN REDUCTION INTERNAL FIXATION (ORIF) ANKLE FRACTURE;  Surgeon: Budd Palmer, MD;  Location: [facility] OR;  Service: Orthopedics;  Laterality: Left;  ORIF left pilon   Family History: Mother died age 28 dementia; father died from pancreatic cancer at 55. Social History: Married.Former smoker.   Quit 40 yr ago.  Doesn't drink EtOH.  [employer] grad.  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 Ivp Dye (Iodinated Diagnostic Agents)     Unknown  . Ofloxacin Nausea And Vomiting  . Oxycodone     Altered mental status  . Sulfa Antibiotics Rash  . Sulfamethoxazole W-Trimethoprim Rash   Prior to Admission medications   Medication Sig Start Date End Date Taking? Authorizing Provider  acetaminophen (TYLENOL) 325 MG tablet Take 650 mg by mouth every 6 (six) hours as needed. For pain   Yes Historical Provider, MD  allopurinol (ZYLOPRIM) 100 MG tablet Take 200 mg by mouth daily.   Yes Historical Provider, MD  aspirin EC 325 MG tablet Take 1 tablet (325 mg total) by mouth daily. 12/08/11 02/02/12 Yes Mearl Latin, PA  calcium acetate (PHOSLO) 667 MG capsule Take [phone] mg by mouth 3 (three) times daily with meals. Takes 2 with meals, takes 1 with snacks   Yes Historical Provider, MD  gabapentin (NEURONTIN) 300 MG capsule Take 300 mg by mouth at bedtime as needed. For pain   Yes Historical Provider, MD  lansoprazole (PREVACID) 15 MG capsule Take 15 mg by mouth daily. Sometimes takes a 2nd dose   Yes Historical Provider, MD  metoprolol (LOPRESSOR) 50 MG tablet Take 25 mg by mouth daily.   Yes Historical Provider, MD  multivitamin (RENA-VIT) TABS tablet Take 1 tablet by mouth daily.   Yes Historical Provider, MD  PARoxetine (PAXIL) 30 MG tablet Take 30 mg by mouth every morning.   Yes Historical Provider, MD  sevelamer (RENAGEL) 800 MG tablet Take 1,600 mg by mouth 3 (three) times daily with meals.   Yes Historical Provider, MD  Tamsulosin HCl (FLOMAX) 0.4 MG CAPS Take 1 capsule (0.4 mg total) by mouth daily. 12/09/11  Yes Zannie Cove, MD   Current Facility-Administered Medications  Medication Dose Route Frequency Provider Last Rate Last Dose  . acetaminophen (TYLENOL) tablet 650 mg  650 mg Oral Q6H PRN Osvaldo Shipper, MD   325 mg at 01/03/12 1201   Or  .  acetaminophen (TYLENOL) suppository 650 mg  650 mg Rectal Q6H PRN Osvaldo Shipper, MD      . albuterol (PROVENTIL) (5 MG/ML) 0.5% nebulizer solution 2.5 mg  2.5 mg Nebulization Q2H PRN Osvaldo Shipper, MD      . allopurinol (ZYLOPRIM) tablet 200 mg  200 mg Oral Daily Osvaldo Shipper, MD   200 mg at 01/03/12 1148  . aspirin EC tablet 325 mg  325 mg Oral Daily Osvaldo Shipper, MD   325 mg at 01/03/12 1147  . calcium acetate (PHOSLO) capsule 1,334 mg  1,334 mg Oral TID WC Osvaldo Shipper, MD   1,334 mg at 01/03/12 1252  . gabapentin (NEURONTIN) capsule 300 mg  300 mg Oral QHS Osvaldo Shipper, MD      . heparin injection 5,000 Units  5,000 Units Subcutaneous Q8H Osvaldo Shipper, MD   5,000 Units at 01/03/12 0612  . HYDROcodone-acetaminophen (NORCO) 5-325 MG per tablet 1-2 tablet  1-2 tablet Oral Q4H PRN Osvaldo Shipper, MD      . lansoprazole (PREVACID) capsule 15 mg  15 mg Oral Custom Zannie Cove, MD      . metoprolol tartrate (LOPRESSOR) tablet 25 mg  25 mg Oral Daily Osvaldo Shipper, MD   25 mg at 01/03/12 1147  . multivitamin (RENA-VIT) tablet 1 tablet  1 tablet Oral Daily Osvaldo Shipper, MD   1 tablet at 01/03/12 1147  . ondansetron (ZOFRAN) tablet 4 mg  4 mg Oral Q6H PRN Osvaldo Shipper, MD       Or  . ondansetron (ZOFRAN) injection 4 mg  4 mg Intravenous Q6H PRN Osvaldo Shipper, MD      . PARoxetine (PAXIL) tablet 30 mg  30 mg Oral Cyndia Diver, MD   30 mg at 01/03/12 0814  . sevelamer (RENAGEL) tablet 1,600 mg  1,600 mg Oral TID WC Osvaldo Shipper, MD      . sodium chloride 0.9 % injection 3 mL  3 mL Intravenous Q12H Osvaldo Shipper, MD   3 mL at 01/03/12 1148  . Tamsulosin HCl (FLOMAX) capsule 0.4 mg  0.4 mg Oral Daily Osvaldo Shipper, MD   0.4 mg at 01/03/12 1147  . technetium albumin aggregated (MAA) injection solution 3 milli Curie  3 milli Curie Intravenous Once PRN Medication Radiologist, MD   3 milli Curie at 01/03/12 1040  . vancomycin (VANCOCIN) 500 mg in sodium chloride 0.9 % 100 mL  IVPB  500 mg Intravenous Once Zannie Cove, MD   500 mg at 01/03/12 0325  . vancomycin (VANCOCIN) 750 mg in sodium chloride 0.9 % 150 mL IVPB  750 mg Intravenous Q T,Th,Sa-HD Zannie Cove, MD      . xenon xe 133 gas 17 milli Curie  17 milli Curie Inhalation Once PRN Medication Radiologist, MD   17 milli Curie at 01/03/12 1110  . DISCONTD: pantoprazole (PROTONIX) EC tablet 20 mg  20  mg Oral Q1200 Osvaldo Shipper, MD      . DISCONTD: piperacillin-tazobactam (ZOSYN) IVPB 2.25 g  2.25 g Intravenous Q8H Zannie Cove, MD   2.25 g at 01/03/12 0612   Labs: Basic Metabolic Panel:  Lab 01/03/12 1610  NA 141  K 3.7  CL 102  CO2 25  GLUCOSE 116*  BUN 18  CREATININE 4.10*  CALCIUM 8.7  ALB --  PHOS --   Liver Function Tests:  Lab 01/03/12 0143  AST 19  ALT 8  ALKPHOS 108  BILITOT 0.5  PROT 5.9*  ALBUMIN 2.9*  CBC:  Lab 01/03/12 0143  WBC 8.8  NEUTROABS --  HGB 10.2*  HCT 30.7*  MCV 97.2  PLT 169   Cardiac Enzymes:  Lab 01/03/12 0148  CKTOTAL 34  CKMB 2.8  CKMBINDEX --  TROPONINI <0.30   CBG:  Lab 01/03/12 1158 01/03/12 0746 01/03/12 0021  GLUCAP 87 110* 121*  Studies/Results: Dg Chest 2 View  01/03/2012  *RADIOLOGY REPORT*  Clinical Data: Increased shortness of breath  CHEST - 2 VIEW  Comparison: Portable chest x-ray of 12/02/2011  Findings: There is more opacity medially at the right lung base consistent with atelectasis or pneumonia most likely involving the right lower lobe.  There also appear to be small effusions present blunting the posterior costophrenic angles.  Moderate cardiomegaly is present and there may be mild superimposed pulmonary vascular congestion as well.  There are diffuse degenerative changes throughout the thoracic spine .  IMPRESSION:  1.  Cardiomegaly, small effusions, and mild pulmonary vascular congestion. 2.  New opacity medially at the right lung base may reflect atelectasis or pneumonia.  Original Report Authenticated By: Juline Patch, M.D.     Dg Ankle Complete Left  01/03/2012  *RADIOLOGY REPORT*  Clinical Data: Redness/swelling, postop  LEFT ANKLE COMPLETE - 3+ VIEW  Comparison: [facility] ankle radiographs dated 7/2/2013the second radiograph dated 12/08/2011.  Findings: Lateral plate and screw fixation of a distal tibial fracture.  Additional screw in the distal tibia oriented in the AP direction.  Lateral plate and screw fixation of the distal fibula.  Fracture lucencies are still visible.  Heterotopic ossification is present.  Appearance is otherwise unchanged from 12/08/2011.  Stable anterior acortical stepoff/mild displacement of the distal tibial fracture fragment.  Generalized mild soft tissue swelling.  Lucencies in the mid tibia and calcaneus from prior external fixation.  IMPRESSION: Status post ORIF of distal tibial and fibular fractures, as described above, unchanged.  Original Report Authenticated By: Charline Bills, M.D.   Nm Pulmonary Per & Vent  01/03/2012  *RADIOLOGY REPORT*  Clinical Data:  Shortness of breath  NUCLEAR MEDICINE VENTILATION - PERFUSION LUNG SCAN  Technique:  Wash-in, equilibrium, and wash-out phase ventilation images were obtained using Xe-133 gas.  Perfusion images were obtained in multiple projections after intravenous injection of Tc- 13m MAA.  Radiopharmaceuticals:  17.0 mCi Xe-133 gas and 3.0 mCi Tc-21m MAA.  Comparison:  None Correlation:  Chest radiograph 01/03/2012  Findings: The patient removed the ventilation mask shortly after single breath inhalation. Ventilation exam is therefore markedly limited. Diminished ventilation left lower lobe. No other gross ventilatory defects identified, though assessment is severely limited.  Perfusion lung scan in eight projections shows diminished perfusion at posterior aspect of the left lower lobe, likely related to pleural effusion present on chest radiograph. Single small area of diminished subsegmental perfusion at medial right lung base. No other perfusion  abnormalities identified.  Chest radiograph demonstrates enlargement of cardiac silhouette, bilateral  pleural effusions, and a new medial right lung base opacity question atelectasis versus pneumonia.  Findings represent a low probability for pulmonary embolism.  IMPRESSION: Low probability for pulmonary embolism.  Original Report Authenticated By: Lollie Marrow, M.D.   ROS: As per HPI otherwise negative.  Physical Exam: Filed Vitals:   01/03/12 0000 01/03/12 0530  BP: 173/82 173/79  Pulse: 63 60  Temp: 98.2 F (36.8 C) 97.9 F (36.6 C)  TempSrc: Oral Oral  Resp: 18 18  Height: 5\' 7"  (1.702 m)   Weight: 98.8 kg (217 lb 13 oz)   SpO2: 95% 92%     General: Well developed, well nourished, in no acute distress. - up at the sink washing up. Head: Normocephalic, atraumatic, sclera non-icteric, mucus membranes are moist Neck: Supple. JVD not elevated. Lungs: Decreased bases; rare crackles Heart: RRR with S1 S2. No murmurs, rubs, or gallops appreciated. Abdomen: Soft, non-tender,  M-S:  Strength and tone appear normal for age. Lower extremities: left ankle and foot 1 + edema; mild erythema (improving per pt and per outline on foot);right tr LE  edema no ischemic changes, several reddened areas on dorsum of foot where he previous hit right foot with left cast. Medial R foot also red and skin peeling Neuro: Alert and oriented X 3. Moves all extremities spontaneously.- up walking in room Skin:  Dry - lower extremities - esp left flakey Psych:  Responds to questions appropriately with a normal affect. Dialysis Access: left lower AVF + bruit and thrill  Dialysis Orders: Center: [facility] on TTS . EDW 96HD Bath 2K 2.5 Ca  Time 3.5 hours Heparin standard. Access left lower AVF BFR 400 DFR 600 Zemplar none Epogen 6600  Units IV/HD  Venofer  100 tiw started 6/28  Assessment/Plan: 1.  Cellulitis left ankle - seen by Dr. Orvan Falconer; zosyn d/c; continuing Vanc;  2.  ESRD - orders written for TTS;  while he normally has a 2 K bath, will start with 4 K because K is 3.7 3.  Hypertension/volume  - BP elevated on outpt dialysis treatment sheet from 7/02; not clear what post wt was; today 2.8 above EDW; try for 4 liters Thursday; only on 25 of metoprolol; He tells me he previously was on 100, but it has been decreased. CXR showed small effusions; pul vasc congestion  4.  Anemia  - continue ESA ; dose Aranesp 60 and continue IV Fe 5.  Metabolic bone disease - not on vitamin D 6.  Nutrition - low albumin - change to high protein renal diet and add nepro daily.  Sheffield Slider, PA-C [facility] [phone] 01/03/2012, 3:15 PM   I saw and examined Mr. Stipp spoke with him, his wife, and  Judie Petit. Doran Durand, [place].  Agree with  Plans for IV Vancomycin and dialysis tomorrow.  Agree with plans as outlined above.

## 2012-01-03 NOTE — Consult Note (Signed)
Patient is now 3 wks out from repair of open left tib fib pilon fracture. He has been wearing his CAM boot continuously without sock or change, and with only one cleaning by therapist since discharge Admitted for IV ABx for cellulitis of heel, particularly medially and distal to malleolus; no drainage, denies fever  PE: prominent erythema of left foot distal to medial malleolus with some skin blistering, outlined with a pen; incisions look excellent medially and laterally without drainage or induration; no significant edema; active extension of 5 degrees and flexion of 25  X-rays show no hardware loosening, migration, or loss of fracture reduction  Assessment Cellulitis of left foot, most likely secondary to poor hygiene in cam boot while patient's calcaneal ex-fix pin sites were healing No evidence of hardware contamination at this point  Plan PT ordered, NWB with AROM and PROM of ankle OK to d/c further bracing for now; instructed to remove and wash the liner of his CAM for future use once he returns to WB IV ABx until infection well controlled then d/c to home on oral if possible or continue with iv; if convert to oral I will see in one week after discharge, o/w 2 wks if iv I am leaving town but will have someone covering for me, as well  Myrene Galas, MD [facility] [phone] (p)

## 2012-01-03 NOTE — H&P (Signed)
[name] [name] is an 77 y.o. male.    PCP: Louie Boston, MD   His nephrologist is Dr. Kristian Covey Orthopedic doctor is Dr. Carola Frost  Chief Complaint: Pain in the left ankle  HPI: This is a 77 year old, Caucasian male, with a past medical history of end-stage renal disease on hemodialysis, hypertension, who had fracture in his left ankle area about 3 months ago. He had open reduction and internal fixation of the joint at that time. Subsequently, he had another break, which required him being transferred to [facility] cone. This was early June and he was found to have infection in the left ankle. He had removal of the hardware along with irrigation and debridement done on June 1. Subsequently, he had another open reduction, internal fixation done on June 6. Subsequently, he was discharged home.  Patient tells me that he was doing well till about a week ago, when he noticed that the pain in the left ankle was increasing. The physical therapist remove the dressing and they found that the skin around the left ankle was red. There was warmth in that area. Patient denies any fever. He's been finding it difficult to ambulate. Has nausea, but denies any vomiting. He also mentioned, that he's been having some difficulty breathing over the last several days. And whenever he bends over he feels little dizzy. He's had a dry cough. Denies any chest pain. He gets dialyzed on Tuesdays, Thursdays, and Saturday, and has had dialysis within the last 24 hours. With these symptoms he presented to the [facility] emergency department, who contacted the orthopedic surgeon here and he was transferred. And we were asked to admit this patient due to his comorbidities.   Home Medications: Prior to Admission medications   Medication Sig Start Date End Date Taking? Authorizing Provider  acetaminophen (TYLENOL) 325 MG tablet Take 650 mg by mouth every 6 (six) hours as needed. For pain    Historical Provider, MD  allopurinol (ZYLOPRIM)  100 MG tablet Take 200 mg by mouth daily.    Historical Provider, MD  aspirin EC 325 MG tablet Take 1 tablet (325 mg total) by mouth daily. 12/08/11 02/02/12  Mearl Latin, PA  calcium acetate (PHOSLO) 667 MG capsule Take 1,334 mg by mouth 3 (three) times daily with meals.    Historical Provider, MD  gabapentin (NEURONTIN) 300 MG capsule Take 300 mg by mouth at bedtime as needed. For pain    Historical Provider, MD  lansoprazole (PREVACID) 15 MG capsule Take 15 mg by mouth daily.    Historical Provider, MD  metoprolol (LOPRESSOR) 50 MG tablet Take 25 mg by mouth daily.    Historical Provider, MD  multivitamin (RENA-VIT) TABS tablet Take 1 tablet by mouth daily.    Historical Provider, MD  PARoxetine (PAXIL) 30 MG tablet Take 30 mg by mouth every morning.    Historical Provider, MD  sevelamer (RENAGEL) 800 MG tablet Take 1,600 mg by mouth 3 (three) times daily with meals.    Historical Provider, MD  Tamsulosin HCl (FLOMAX) 0.4 MG CAPS Take 1 capsule (0.4 mg total) by mouth daily. 12/09/11   Zannie Cove, MD    Allergies:  Allergies  Allergen Reactions  . Ivp Dye (Iodinated Diagnostic Agents)     Unknown  . Ofloxacin Nausea And Vomiting  . Oxycodone     Altered mental status  . Sulfa Antibiotics Rash  . Sulfamethoxazole W-Trimethoprim Rash    Past Medical History: Past Medical History  Diagnosis Date  . ESRD  on dialysis     Left fistula   . Ankle fracture     Left ~08/2011. [place] fracture  . DM (diabetes mellitus)     Per pt, borderline   . Renal cell cancer     Remote. S/p right nephrectomy.     Past Surgical History  Procedure Date  . External fixation leg 12/02/2011    Procedure: EXTERNAL FIXATION LEG;  Surgeon: Velna Ochs, MD;  Location: [facility] OR;  Service: Orthopedics;  Laterality: Left;  . I&d extremity 12/02/2011    Procedure: IRRIGATION AND DEBRIDEMENT EXTREMITY;  Surgeon: Velna Ochs, MD;  Location: [facility] OR;  Service: Orthopedics;  Laterality: Left;  . Hardware  removal 12/02/2011    Procedure: HARDWARE REMOVAL;  Surgeon: Velna Ochs, MD;  Location: [facility] OR;  Service: Orthopedics;  Laterality: Left;  . Orif ankle fracture 12/07/2011    Procedure: OPEN REDUCTION INTERNAL FIXATION (ORIF) ANKLE FRACTURE;  Surgeon: Budd Palmer, MD;  Location: [facility] OR;  Service: Orthopedics;  Laterality: Left;  ORIF left pilon    Social History:  reports that he has quit smoking. He does not have any smokeless tobacco history on file. He reports that he does not drink alcohol or use illicit drugs.  Family History: Denies any medical problems and his family.  Review of Systems - History obtained from the patient General ROS: positive for  - fatigue Psychological ROS: negative Ophthalmic ROS: negative ENT ROS: negative Allergy and Immunology ROS: negative Hematological and Lymphatic ROS: negative Endocrine ROS: negative Respiratory ROS: as in hpi Cardiovascular ROS: negative Gastrointestinal ROS: no abdominal pain, change in bowel habits, or black or bloody stools Genito-Urinary ROS: no dysuria, trouble voiding, or hematuria Musculoskeletal ROS: as in hpi Neurological ROS: no TIA or stroke symptoms Dermatological ROS: negative  Physical Examination Blood pressure 173/82, pulse 63, temperature 98.2 F (36.8 C), temperature source Oral, resp. rate 18, weight 98.8 kg (217 lb 13 oz), SpO2 95.00%.  General appearance: alert, cooperative, appears stated age and no distress Head: Normocephalic, without obvious abnormality, atraumatic Eyes: conjunctivae/corneas clear. PERRL, EOM's intact.  Throat: lips, mucosa, and tongue normal; teeth and gums normal Neck: no adenopathy, no carotid bruit, no JVD, supple, symmetrical, trachea midline and thyroid not enlarged, symmetric, no tenderness/mass/nodules Back: symmetric, no curvature. ROM normal. No CVA tenderness. Resp: clear to auscultation bilaterally Cardio: regular rate and rhythm, S1, S2 normal, no murmur, click, rub or  gallop GI: soft, non-tender; bowel sounds normal; no masses,  no organomegaly Extremities: No calf tenderness was present in either extremities. The left leg was mildly enlarged compared to the right. There was erythema noticed in the medial aspect of the left ankle. It extended all the way down to the left heel. This area was tender to palpation. Was slightly warm to touch. Peripheral pulses were palpable. Pulses: 2+ and symmetric Skin: Skin color, texture, turgor normal. No rashes or lesions Lymph nodes: Cervical, supraclavicular, and axillary nodes normal. Neurologic: This alert and oriented x3. No focal neurological deficits were present  Laboratory Data: Labs were obtained at [facility]  Patient's white blood cell count was 8.7. Neutrophils 66%. Hemoglobin was 10.5. MCV was 98.2. Platelet count was 176. Sodium 139, potassium 3.3, chloride 102. Bicarbonate 26. BUN 17, and creatinine was 4.1. 4.  Radiology Reports: There is a Left ankle x-ray report from [facility], which states that: plate and screw fixation of the distal tibia and fibula in the left ankle was noted. There is cortical step-off of  the anterior tibia 5 mm proximal to the AP oriented screw. This may represent resorption/healing of the prior fracture, however, infection is not entirely excluded.  Electrocardiogram: pending  Assessment/Plan  Principal Problem:  *Cellulitis of left ankle Active Problems:  ESRD on dialysis  Neuropathy  Hyperparathyroidism due to renal insufficiency  Dyspnea  Anemia in CKD (chronic kidney disease)   #1 cellulitis of left ankle and possible infection of the joint. He will be treated with vancomycin and Zosyn. Dr. Carola Frost is already aware of this patient and he will evaluate him in the morning.  #2 dyspnea: Etiology is unclear. The VTE is always a possibility. EKG will be checked. Chest x-ray will be obtained and a VQ scan will be ordered. A venous Doppler will be obtained of  the lower extremities  #3 end-stage renal disease on dialysis: Nephrology has been called and a voicemail has been left for them.  #4 history of neuropathy: Continue with gabapentin.  #5 anemia: Monitor blood counts closely. His hemoglobin appears to be at baseline  DVT, prophylaxis will be initiated.  He is a full code.  Further management decisions will depend on results of further testing and patient's response to treatment.  [facility]  [facility] Pager [phone]  01/03/2012, 1:14 AM

## 2012-01-03 NOTE — Consult Note (Signed)
[facility]          Day 2 vancomycin        Day 2 piperacillin tazobactam       Reason for Consult: Left ankle cellulitis    Referring Physician: Dr. Zannie Cove  Principal Problem:  *Cellulitis of left ankle Active Problems:  ESRD on dialysis  Neuropathy  Hyperparathyroidism due to renal insufficiency  Dyspnea  Anemia in CKD (chronic kidney disease)      . allopurinol  200 mg Oral Daily  . aspirin EC  325 mg Oral Daily  . calcium acetate  1,334 mg Oral TID WC  . gabapentin  300 mg Oral QHS  . heparin  5,000 Units Subcutaneous Q8H  . lansoprazole  15 mg Oral Custom  . metoprolol  25 mg Oral Daily  . multivitamin  1 tablet Oral Daily  . PARoxetine  30 mg Oral BH-q7a  . piperacillin-tazobactam (ZOSYN)  IV  2.25 g Intravenous Q8H  . sevelamer  1,600 mg Oral TID WC  . sodium chloride  3 mL Intravenous Q12H  . Tamsulosin HCl  0.4 mg Oral Daily  . vancomycin  500 mg Intravenous Once  . vancomycin  750 mg Intravenous Q T,Th,Sa-HD  . DISCONTD: pantoprazole  20 mg Oral Q1200    Recommendations: 1. Continue vancomycin for now 2. Discontinue piperacillin tazobactam 3. I will followup tomorrow   Assessment: Mr. Coward probably has superficial cellulitis related to friction from his soft cast and possibly with superimposed infection. Although I cannot be certain I doubt that he has deep infection around the fracture sites and hardware. Since he is improving so rapidly I would continue IV vancomycin for now and I will followup tomorrow. I do not think that he needs the broader gram-negative rods and anaerobic coverage provided by piperacillin tazobactam so I will stop it. If he continues to improved rapidly and he probably will not need any antibiotic therapy after discharge.    HPI: [name] [name] is a 77 y.o. male who fell and broke his left ankle while trying to chase a possum out of his yard this past February. He underwent open reduction and  internal fixation and wore a soft cast for a few months. He was allowed to return to weight bearing status but then felt a sudden pop and developed acute pain while he was walking to his local drug store in late May. At that time x-rays revealed lucency around the screws in his left ankle as well as new fibula fractures. He underwent removal of the hardware and debridement with placement of an external fixator on June 1. Gram stain and culture are from the screw holes were both negative. On June 6 he underwent removal of the external fixator and redo internal fixation. He was not discharged on any antibiotics. He was placed back in a soft cast which he wore without a sock. He noticed increased discomfort around his ankle and when his wife removed the cast yesterday morning he noted that it was bright red with blisters around his medial ankle. He has not had any fever that he is aware of. He was readmitted and started on empiric vancomycin and piperacillin and tazobactam. He is feeling much better today and states that his ankle appears to be much improved.   Review of Systems: Pertinent items are noted in HPI.  Past Medical History  Diagnosis Date  . ESRD on dialysis     Left fistula   .  Ankle fracture     Left ~08/2011. [place] fracture  . DM (diabetes mellitus)     Per pt, borderline   . Renal cell cancer     Remote. S/p right nephrectomy.     History  Substance Use Topics  . Smoking status: Former Games developer  . Smokeless tobacco: Not on file  . Alcohol Use: No    History reviewed. No pertinent family history. Allergies  Allergen Reactions  . Ivp Dye (Iodinated Diagnostic Agents)     Unknown  . Ofloxacin Nausea And Vomiting  . Oxycodone     Altered mental status  . Sulfa Antibiotics Rash  . Sulfamethoxazole W-Trimethoprim Rash    OBJECTIVE: Blood pressure 173/79, pulse 60, temperature 97.9 F (36.6 C), temperature source Oral, resp. rate 18, height 5\' 7"  (1.702 m), weight 98.8  kg (217 lb 13 oz), SpO2 92.00%. General: He is in good spirits and in no distress Lungs: Clear Cor: Regular S1 and S2 no murmurs Abdomen: Obese, soft and nontender Left foot: He has healed surgical incisions. He has some dusky redness receding from the pen marks placed yesterday. There is some superficial desquamation.  Microbiology: No results found for this or any previous visit (from the past 240 hour(s)).  Cliffton Asters, MD [facility] [phone] pager   [phone] cell 01/03/2012, 1:15 PM

## 2012-01-03 NOTE — Progress Notes (Signed)
Report called from Lottie Dawson while awaiting [facility] to come and pick up the patient.  He was transferred from [facility] in [place], [place] and arrived to the floor around 0100. Patient transferred from the stretcher to the bed with some dyspnea on exertion.  Admissions called on patients arrival and patient assessed while awaiting their arrival.  Orders given and patient is now resting in the room with his wife at the bedside.

## 2012-01-03 NOTE — Progress Notes (Addendum)
Pt seen and examined, admitted this am by Dr.Krishnan Xray L foot unremarkable, unable to pursue MRI due to hardware, on VAnc/Zosyn Will request ID input, duration of antibiotics especially given multiple complicated surgeries on L ankle For ESRD, will notify Renal of admission Dyspnea-resolved, VQ with low probability of PE  Zannie Cove, MD [phone]

## 2012-01-03 NOTE — Progress Notes (Signed)
Bilateral lower extremity venous duplex completed.  Preliminary report is negative for DVT, SVT, or a Baker's cyst. 

## 2012-01-04 ENCOUNTER — Inpatient Hospital Stay (HOSPITAL_COMMUNITY): Payer: Medicare PPO

## 2012-01-04 DIAGNOSIS — E119 Type 2 diabetes mellitus without complications: Secondary | ICD-10-CM

## 2012-01-04 LAB — RENAL FUNCTION PANEL
Albumin: 3.3 g/dL — ABNORMAL LOW (ref 3.5–5.2)
BUN: 26 mg/dL — ABNORMAL HIGH (ref 6–23)
CO2: 22 mEq/L (ref 19–32)
Calcium: 8.8 mg/dL (ref 8.4–10.5)
Chloride: 99 mEq/L (ref 96–112)
Creatinine, Ser: 5.42 mg/dL — ABNORMAL HIGH (ref 0.50–1.35)
GFR calc Af Amer: 11 mL/min — ABNORMAL LOW (ref >90)
GFR calc non Af Amer: 9 mL/min — ABNORMAL LOW (ref >90)
Glucose, Bld: 168 mg/dL — ABNORMAL HIGH (ref 70–99)
Phosphorus: 4 mg/dL (ref 2.3–4.6)
Potassium: 4 mEq/L (ref 3.5–5.1)
Sodium: 138 mEq/L (ref 135–145)

## 2012-01-04 LAB — HEPATITIS B SURFACE ANTIGEN: Hepatitis B Surface Ag: NEGATIVE

## 2012-01-04 LAB — CBC
HCT: 34.2 % — ABNORMAL LOW (ref 39.0–52.0)
Hemoglobin: 11 g/dL — ABNORMAL LOW (ref 13.0–17.0)
MCH: 31.3 pg (ref 26.0–34.0)
MCHC: 32.2 g/dL (ref 30.0–36.0)
RBC: 3.52 MIL/uL — ABNORMAL LOW (ref 4.22–5.81)

## 2012-01-04 MED ORDER — HYDROCODONE-ACETAMINOPHEN 5-325 MG PO TABS: 1.0000 | ORAL_TABLET | ORAL | Status: AC | PRN

## 2012-01-04 MED ORDER — DARBEPOETIN ALFA-POLYSORBATE 60 MCG/0.3ML IJ SOLN
INTRAMUSCULAR | Status: AC
  Administered 2012-01-04: 60 ug via INTRAVENOUS
  Filled 2012-01-04: qty 0.3

## 2012-01-04 NOTE — Progress Notes (Signed)
Assessment/Plan:  1. Cellulitis left ankle -   2. ESRD - orders written for TTS Treatment today and last dose of Vanco 3. Hypertension/volume -  4. Anemia - continue ESA ;  5. Metabolic bone disease - not on vitamin D 6. Nutrition - low albumin - change to high protein renal diet and add nepro daily  Subjective: Interval History: Feels better. Plan is for discharge as I understand.  Objective: Vital signs in last 24 hours: Temp:  [97.8 F (36.6 C)-98.3 F (36.8 C)] 98.3 F (36.8 C) (07/04 1010) Pulse Rate:  [53-60] 53  (07/04 1010) Resp:  [18] 18  (07/04 1010) BP: (122-151)/(57-66) 122/57 mmHg (07/04 1010) SpO2:  [92 %-97 %] 97 % (07/04 1010) Weight:  [95.437 kg (210 lb 6.4 oz)] 95.437 kg (210 lb 6.4 oz) (07/03 2140) Weight change: -3.363 kg (-7 lb 6.6 oz)  Intake/Output from previous day: 07/03 0701 - 07/04 0700 In: 240 [P.O.:240] Out: -  Intake/Output this shift: Total I/O In: 120 [P.O.:120] Out: -   General appearance: alert, cooperative and appears stated age Head: Normocephalic, without obvious abnormality, atraumatic Resp: clear to auscultation bilaterally Cardio: regular rate and rhythm, S1, S2 normal, no murmur, click, rub or gallop Extremities: 1+ edama  Lab Results:  Basename 01/04/12 0920 01/03/12 0143  WBC 8.2 8.8  HGB 11.0* 10.2*  HCT 34.2* 30.7*  PLT 191 169   BMET:  Basename 01/04/12 0920 01/03/12 0143  NA 138 141  K 4.0 3.7  CL 99 102  CO2 22 25  GLUCOSE 168* 116*  BUN 26* 18  CREATININE 5.42* 4.10*  CALCIUM 8.8 8.7   No results found for this basename: PTH:2 in the last 72 hours Iron Studies: No results found for this basename: IRON,TIBC,TRANSFERRIN,FERRITIN in the last 72 hours Studies/Results: Dg Chest 2 View  01/03/2012  *RADIOLOGY REPORT*  Clinical Data: Increased shortness of breath  CHEST - 2 VIEW  Comparison: Portable chest x-ray of 12/02/2011  Findings: There is more opacity medially at the right lung base consistent with  atelectasis or pneumonia most likely involving the right lower lobe.  There also appear to be small effusions present blunting the posterior costophrenic angles.  Moderate cardiomegaly is present and there may be mild superimposed pulmonary vascular congestion as well.  There are diffuse degenerative changes throughout the thoracic spine .  IMPRESSION:  1.  Cardiomegaly, small effusions, and mild pulmonary vascular congestion. 2.  New opacity medially at the right lung base may reflect atelectasis or pneumonia.  Original Report Authenticated By: Juline Patch, M.D.   Dg Ankle Complete Left  01/03/2012  *RADIOLOGY REPORT*  Clinical Data: Redness/swelling, postop  LEFT ANKLE COMPLETE - 3+ VIEW  Comparison: [facility] ankle radiographs dated 7/2/2013the second radiograph dated 12/08/2011.  Findings: Lateral plate and screw fixation of a distal tibial fracture.  Additional screw in the distal tibia oriented in the AP direction.  Lateral plate and screw fixation of the distal fibula.  Fracture lucencies are still visible.  Heterotopic ossification is present.  Appearance is otherwise unchanged from 12/08/2011.  Stable anterior acortical stepoff/mild displacement of the distal tibial fracture fragment.  Generalized mild soft tissue swelling.  Lucencies in the mid tibia and calcaneus from prior external fixation.  IMPRESSION: Status post ORIF of distal tibial and fibular fractures, as described above, unchanged.  Original Report Authenticated By: Charline Bills, M.D.   Nm Pulmonary Per & Vent  01/03/2012  *RADIOLOGY REPORT*  Clinical Data:  Shortness of breath  NUCLEAR MEDICINE  VENTILATION - PERFUSION LUNG SCAN  Technique:  Wash-in, equilibrium, and wash-out phase ventilation images were obtained using Xe-133 gas.  Perfusion images were obtained in multiple projections after intravenous injection of Tc- 23m MAA.  Radiopharmaceuticals:  17.0 mCi Xe-133 gas and 3.0 mCi Tc-15m MAA.  Comparison:  None Correlation:  Chest  radiograph 01/03/2012  Findings: The patient removed the ventilation mask shortly after single breath inhalation. Ventilation exam is therefore markedly limited. Diminished ventilation left lower lobe. No other gross ventilatory defects identified, though assessment is severely limited.  Perfusion lung scan in eight projections shows diminished perfusion at posterior aspect of the left lower lobe, likely related to pleural effusion present on chest radiograph. Single small area of diminished subsegmental perfusion at medial right lung base. No other perfusion abnormalities identified.  Chest radiograph demonstrates enlargement of cardiac silhouette, bilateral pleural effusions, and a new medial right lung base opacity question atelectasis versus pneumonia.  Findings represent a low probability for pulmonary embolism.  IMPRESSION: Low probability for pulmonary embolism.  Original Report Authenticated By: Lollie Marrow, M.D.    Scheduled:   . allopurinol  200 mg Oral Daily  . aspirin EC  325 mg Oral Daily  . calcium acetate  1,334 mg Oral TID WC  . darbepoetin (ARANESP) injection - DIALYSIS  60 mcg Intravenous Q Thu-HD  . feeding supplement (NEPRO CARB STEADY)  237 mL Oral BID BM  . ferric gluconate (FERRLECIT/NULECIT) IV  125 mg Intravenous Q T,Th,Sa-HD  . gabapentin  300 mg Oral QHS  . heparin  5,000 Units Subcutaneous Q8H  . lansoprazole  15 mg Oral Custom  . metoprolol  25 mg Oral Daily  . multivitamin  1 tablet Oral Daily  . PARoxetine  30 mg Oral BH-q7a  . sevelamer  1,600 mg Oral TID WC  . sodium chloride  3 mL Intravenous Q12H  . Tamsulosin HCl  0.4 mg Oral Daily  . vancomycin  750 mg Intravenous Q T,Th,Sa-HD  . DISCONTD: pantoprazole  20 mg Oral Q1200  . DISCONTD: piperacillin-tazobactam (ZOSYN)  IV  2.25 g Intravenous Q8H      LOS: 2 days   Rannie Craney C 01/04/2012,12:06 PM

## 2012-01-04 NOTE — Progress Notes (Signed)
Physical Therapy Treatment Patient Details Name: [name] [name] MRN: [id number] DOB: 1935-02-19 Today's Date: 01/04/2012 Time: 1478-2956 PT Time Calculation (min): 17 min  PT Assessment / Plan / Recommendation Comments on Treatment Session  Pt. walked on room air today with O2 sats at 92% post walk.He did not want to attempt to practice one step today, but discussed technique with pt. and wife.  They state that on Tu, TH the driver of the transport [name] [name] assists pt in the home with WC.  On Sat., he may or may not have to negotiate the one step depending on the driver, but believe he and his wife will manage well.  Will try tomorrow to practice step if pt. still here and agreeable.       Follow Up Recommendations  Home health PT;Supervision/Assistance - 24 hour;Supervision for mobility/OOB    Barriers to Discharge        Equipment Recommendations  3 in 1 bedside comode    Recommendations for Other Services    Frequency Min 3X/week   Plan Discharge plan remains appropriate    Precautions / Restrictions Restrictions Weight Bearing Restrictions: Yes LLE Weight Bearing: Non weight bearing   Pertinent Vitals/Pain Denies pain; O2 sats on 2L O2 at rest 96%, decreased to 92% on room air with amb.  HR 69-72. O2 reapplied after walk.    Mobility  Bed Mobility Bed Mobility: Not assessed Transfers Transfers: Sit to Stand;Stand to Sit Sit to Stand: 4: Min assist;With upper extremity assist;From bed Stand to Sit: 4: Min guard;With upper extremity assist;To bed Details for Transfer Assistance: cues for NWB status and for RW placement, self managed hand placement Ambulation/Gait Ambulation/Gait Assistance: 4: Min guard Ambulation Distance (Feet): 20 Feet Assistive device: Rolling walker Ambulation/Gait Assistance Details: safety cues, good pace and small steps made Gait Pattern: Step-to pattern Stairs: No (pt. did not want to attempt as was not feeling well)    Exercises     PT Diagnosis:      PT Problem List:   PT Treatment Interventions:     PT Goals Acute Rehab PT Goals PT Goal: Sit to Stand - Progress: Progressing toward goal PT Goal: Stand to Sit - Progress: Progressing toward goal PT Goal: Ambulate - Progress: Progressing toward goal  Visit Information  Last PT Received On: 01/04/12 Assistance Needed: +1 (take second person for step)    Subjective Data  Subjective: I don't feel too good today   Cognition  Overall Cognitive Status: Appears within functional limits for tasks assessed/performed Arousal/Alertness: Awake/alert Orientation Level: Appears intact for tasks assessed Behavior During Session: [facility] for tasks performed    Balance     End of Session PT - End of Session Equipment Utilized During Treatment: Gait belt Activity Tolerance: Patient tolerated treatment well Patient left: in bed;with call bell/phone within reach;with family/visitor present   GP     [name] [name] 01/04/2012, 9:01 AM Acute Rehabilitation Services [phone] (pager)

## 2012-01-04 NOTE — Progress Notes (Signed)
Patient ID: [name] [name], male   DOB: 1935/06/15, 77 y.o.   MRN: [id number]    [facility]    Date of Admission:  01/02/2012           Day 3 vancomycin Principal Problem:  *Cellulitis of left ankle Active Problems:  ESRD on dialysis  Neuropathy  Hyperparathyroidism due to renal insufficiency  Dyspnea  Anemia in CKD (chronic kidney disease)      . allopurinol  200 mg Oral Daily  . aspirin EC  325 mg Oral Daily  . calcium acetate  1,334 mg Oral TID WC  . darbepoetin (ARANESP) injection - DIALYSIS  60 mcg Intravenous Q Thu-HD  . feeding supplement (NEPRO CARB STEADY)  237 mL Oral BID BM  . ferric gluconate (FERRLECIT/NULECIT) IV  125 mg Intravenous Q T,Th,Sa-HD  . gabapentin  300 mg Oral QHS  . heparin  5,000 Units Subcutaneous Q8H  . lansoprazole  15 mg Oral Custom  . metoprolol  25 mg Oral Daily  . multivitamin  1 tablet Oral Daily  . PARoxetine  30 mg Oral BH-q7a  . sevelamer  1,600 mg Oral TID WC  . sodium chloride  3 mL Intravenous Q12H  . Tamsulosin HCl  0.4 mg Oral Daily  . vancomycin  750 mg Intravenous Q T,Th,Sa-HD  . DISCONTD: pantoprazole  20 mg Oral Q1200  . DISCONTD: piperacillin-tazobactam (ZOSYN)  IV  2.25 g Intravenous Q8H    Subjective: He is feeling better and denies any pain in his left ankle.  Objective: Temp:  [97.8 F (36.6 C)-98.3 F (36.8 C)] 98.3 F (36.8 C) (07/04 1010) Pulse Rate:  [53-60] 53  (07/04 1010) Resp:  [18] 18  (07/04 1010) BP: (122-151)/(57-66) 122/57 mmHg (07/04 1010) SpO2:  [92 %-97 %] 97 % (07/04 1010) Weight:  [95.437 kg (210 lb 6.4 oz)] 95.437 kg (210 lb 6.4 oz) (07/03 2140)  General: He is in good spirits, joking with his wife His left ankle erythema is much better. His blisters have now dried up and are resolving. There is no fluctuance or tenderness to touch it is not warm.  Assessment: His medial left ankle inflammation is markedly improved. It has more the appearance of abrasion due to  friction with his dressing in soft cast rather than infectious cellulitis. I would recommend that he receive one more dose of vancomycin following dialysis today and then stop. I do not think he needs a long course of antibiotics. He does not have any evidence of deep infection.  Plan: 1. Discontinue vancomycin after today's dose and observe off of antibiotics 2. I will sign off now. Please call if I can be of further assistance.  Cliffton Asters, MD [facility] [phone] pager   [phone] cell 01/04/2012, 10:56 AM

## 2012-01-17 NOTE — Discharge Summary (Addendum)
Physician Discharge Summary  Patient ID: [name] [name] MRN: [id number] DOB/AGE: Aug 10, 1934 77 y.o.  Admit date: 01/02/2012 Discharge date: 01/17/2012  Primary Care Physician:  Louie Boston, MD  Orthopedics: Dr.Handy  Discharge Diagnoses:    Principal Problem:  *Cellulitis of left ankle Active Problems:  ESRD on dialysis  Neuropathy  Hyperparathyroidism due to renal insufficiency  Dyspnea  Anemia in CKD (chronic kidney disease) H/o ORIF L ankle DM H/o Renal cell carcinoma status post nephrectomy   Medication List  As of 01/17/2012  4:57 PM   TAKE these medications         acetaminophen 325 MG tablet   Commonly known as: TYLENOL   Take 650 mg by mouth every 6 (six) hours as needed. For pain      allopurinol 100 MG tablet   Commonly known as: ZYLOPRIM   Take 200 mg by mouth daily.      aspirin EC 325 MG tablet   Take 1 tablet (325 mg total) by mouth daily.      calcium acetate 667 MG capsule   Commonly known as: PHOSLO   Take 667-1,334 mg by mouth 3 (three) times daily with meals. Takes 2 with meals, takes 1 with snacks      gabapentin 300 MG capsule   Commonly known as: NEURONTIN   Take 300 mg by mouth at bedtime as needed. For pain      lansoprazole 15 MG capsule   Commonly known as: PREVACID   Take 15 mg by mouth daily. Sometimes takes a 2nd dose      metoprolol 50 MG tablet   Commonly known as: LOPRESSOR   Take 25 mg by mouth daily.      multivitamin Tabs tablet   Take 1 tablet by mouth daily.      PARoxetine 30 MG tablet   Commonly known as: PAXIL   Take 30 mg by mouth every morning.      sevelamer 800 MG tablet   Commonly known as: RENAGEL   Take 1,600 mg by mouth 3 (three) times daily with meals.      Tamsulosin HCl 0.4 MG Caps   Commonly known as: FLOMAX   Take 1 capsule (0.4 mg total) by mouth daily.           Disposition and Follow-up:  Dr.Handy in 1 week  Consults:  1. Dr. Orvan Falconer infectious disease Dr. Carola Frost orthopedics Dr.  [facility]  Significant Diagnostic Studies:  LEFT ANKLE COMPLETE - 3+ VIEW IMPRESSION: Status post ORIF of distal tibial and fibular fractures, as described above, unchanged.  VQ scan  Findings represent a low probability for pulmonary embolism.  IMPRESSION: Low probability for pulmonary embolism.   Brief H and P: This is a 77 year old, Caucasian male, with a past medical history of end-stage renal disease on hemodialysis, hypertension, who had fracture in his left ankle area about 3 months ago. He had open reduction and internal fixation of the joint at that time. Subsequently, he had another break, which required him being transferred to [facility]. This was early June and he was found to have infection in the left ankle. He had removal of the hardware along with irrigation and debridement done on June 1. Subsequently, he had another open reduction, internal fixation done on June 6. Subsequently, he was discharged home.  Patient tells me that he was doing well till about a week ago, when he noticed that the pain in the left ankle was increasing. The physical  therapist remove the dressing and they found that the skin around the left ankle was red. There was warmth in that area. Patient denies any fever. He's been finding it difficult to ambulate. Has nausea, but denies any vomiting. He also mentioned, that he's been having some difficulty breathing over the last several days. And whenever he bends over he feels little dizzy. He's had a dry cough. Denies any chest pain. He gets dialyzed on Tuesdays, Thursdays, and Saturday, and has had dialysis within the last 24 hours. With these symptoms he presented to the [facility] emergency department, who contacted the orthopedic surgeon here and he was transferred. And we were asked to admit this patient due to his comorbidities.    Hospital Course:  1. Cellulitis of L ankle Had an x-ray which did not show any evidence of  osteomyelitis, was unable to pursue MRI due to hardware in the joint. Subsequently seen in consultation by Dr. Orvan Falconer infectious disease who felt that his inflammation was more than likely from abrasion due to friction with his cast rather than infectious cellulitis, subsequently vancomycin was discontinued after 2 doses. He also didn't feel the need to continue further antibiotics. Patient had very good clinical improvement, was advised not to wear the same cast further and also advised to followup with Dr. Carola Frost in one week  Also had transient dyspnea admission which resolved, had a VQ scan done which was negative for PE Rest of his chronic medical problems remain stable he continued on hemodialysis in the hospital   Time spent on Discharge:  Signed: Drenda Sobecki [facility] 01/17/2012, 4:57 PM

## 2012-02-06 ENCOUNTER — Ambulatory Visit: Payer: Medicare PPO | Admitting: Cardiology

## 2012-02-12 ENCOUNTER — Ambulatory Visit (INDEPENDENT_AMBULATORY_CARE_PROVIDER_SITE_OTHER): Payer: Medicare PPO | Admitting: Cardiology

## 2012-02-12 ENCOUNTER — Encounter: Payer: Self-pay | Admitting: *Deleted

## 2012-02-12 ENCOUNTER — Encounter: Payer: Self-pay | Admitting: Cardiology

## 2012-02-12 VITALS — BP 161/65 | HR 53 | Ht 68.0 in | Wt 209.0 lb

## 2012-02-12 DIAGNOSIS — R0600 Dyspnea, unspecified: Secondary | ICD-10-CM

## 2012-02-12 DIAGNOSIS — R0989 Other specified symptoms and signs involving the circulatory and respiratory systems: Secondary | ICD-10-CM

## 2012-02-12 NOTE — Patient Instructions (Signed)
Your physician has requested that you have a dobutamine echocardiogram. For further information please visit [email or web address] Please follow instruction sheet as given. Your physician recommends that you continue on your current medications as directed. Please refer to the Current Medication list given to you today. We will call you with your results.

## 2012-02-12 NOTE — Progress Notes (Signed)
HPI The patient presents for evaluation of dyspnea. He has no prior cardiac history. However, he does have cardiovascular risk factors. Over the past 24 months or so he has had increasing dyspnea with exertion. He says this has to be with some moderate activity. Over the last 6 months she's been very limited as he broke his ankle and is using a walker. With this level of activity he doesn't bring on shortness of breath. He doesn't describe PND or orthopnea. He has not reported palpitations, presyncope or syncope. He has had no weight gain or edema. He has never had prior cardiac testing in the past. He is on renal dialysis having had a nephrectomy for renal cancer years ago and then failure of his remaining kidney. I suspect this was related to hypertension.  Allergen Reactions  . Ivp Dye (Iodinated Diagnostic Agents)     Unknown  . Ofloxacin Nausea And Vomiting  . Oxycodone     Altered mental status  . Sulfa Antibiotics Rash  . Sulfamethoxazole W-Trimethoprim Rash    Current Outpatient Prescriptions  Medication Sig Dispense Refill  . acetaminophen (TYLENOL) 500 MG tablet Take 500 mg by mouth every 6 (six) hours as needed.      Marland Kitchen allopurinol (ZYLOPRIM) 100 MG tablet Take 100 mg by mouth daily.       . B Complex-C-Zn-Folic Acid (DIALYVITE/ZINC) TABS Take 1 tablet by mouth Daily.      . calcium acetate (PHOSLO) 667 MG capsule Take 667-1,334 mg by mouth 3 (three) times daily with meals. Takes 2 with meals, takes 1 with snacks      . gabapentin (NEURONTIN) 300 MG capsule Take 300 mg by mouth at bedtime as needed. For pain      . HYDROcodone-acetaminophen (NORCO/VICODIN) 5-325 MG per tablet Take 1 tablet by mouth as needed.      . lansoprazole (PREVACID) 15 MG capsule Take 15 mg by mouth daily. Sometimes takes a 2nd dose      . metoprolol (LOPRESSOR) 50 MG tablet Take 50 mg by mouth daily.       Marland Kitchen PARoxetine (PAXIL) 30 MG tablet Take 30 mg by mouth every morning.      . sevelamer (RENAGEL) 800  MG tablet Take 1,600 mg by mouth 3 (three) times daily with meals. & 1 with snacks        Past Medical History  Diagnosis Date  . ESRD on dialysis     Left fistula   . Ankle fracture     Left ~08/2011. [place] fracture  . DM (diabetes mellitus)     Per pt, borderline   . Renal cell cancer     Remote. S/p right nephrectomy.   . Melanoma   . HTN (hypertension)   . Neuropathy     Past Surgical History  Procedure Date  . External fixation leg 12/02/2011    Procedure: EXTERNAL FIXATION LEG;  Surgeon: Velna Ochs, MD;  Location: [facility] OR;  Service: Orthopedics;  Laterality: Left;  . I&d extremity 12/02/2011    Procedure: IRRIGATION AND DEBRIDEMENT EXTREMITY;  Surgeon: Velna Ochs, MD;  Location: [facility] OR;  Service: Orthopedics;  Laterality: Left;  . Hardware removal 12/02/2011    Procedure: HARDWARE REMOVAL;  Surgeon: Velna Ochs, MD;  Location: [facility] OR;  Service: Orthopedics;  Laterality: Left;  . Orif ankle fracture 12/07/2011    Procedure: OPEN REDUCTION INTERNAL FIXATION (ORIF) ANKLE FRACTURE;  Surgeon: Budd Palmer, MD;  Location: [facility] OR;  Service: Orthopedics;  Laterality: Left;  ORIF left pilon    Family History  Problem Relation Age of Onset  . Pancreatic cancer Father   . Dementia Mother     History   Social History  . Marital Status: Married    Spouse Name: N/A    Number of Children: 2  . Years of Education: N/A   Occupational History  . Retired Pharmacologist.    Social History Main Topics  . Smoking status: Former Smoker -- 0.8 packs/day for 10 years    Types: Cigarettes    Quit date: 07/03/1970  . Smokeless tobacco: Never Used  . Alcohol Use: No  . Drug Use: No  . Sexually Active: Not on file   Other Topics Concern  . Not on file   Social History Narrative   Lives at home with wife and grandson.    ROS: "Panic attack", stiff neck and shoulder.   Otherwise, as stated in the HPI and negative for all other systems.   PHYSICAL EXAM BP 161/65   Pulse 53  Ht 5\' 8"  (1.727 m)  Wt 209 lb (94.802 kg)  BMI 31.78 kg/m2  SpO2 99% GENERAL:  Well appearing HEENT:  Pupils equal round and reactive, fundi not visualized, oral mucosa unremarkable NECK:  No jugular venous distention, waveform within normal limits, carotid upstroke brisk and symmetric, no bruits, no thyromegaly LYMPHATICS:  No cervical, inguinal adenopathy LUNGS:  Clear to auscultation bilaterally BACK:  No CVA tenderness CHEST:  Unremarkable HEART:  PMI not displaced or sustained,S1 and S2 within normal limits, no S3, no S4, no clicks, no rubs, no murmurs ABD:  Flat, positive bowel sounds normal in frequency in pitch, no bruits, no rebound, no guarding, no midline pulsatile mass, no hepatomegaly, no splenomegaly EXT:  2 plus pulses throughout, left ankle edema, no cyanosis no clubbing, left arm AV fistula SKIN:  No rashes no nodules NEURO:  Cranial nerves II through XII grossly intact, motor grossly intact throughout PSYCH:  Cognitively intact, oriented to person place and time  EKG:  Sinus rhythm with frequent PVCs, rate 53, nonspecific anterior T-wave changes. No old EKGs for comparison. 01/03/12  ASSESSMENT AND PLAN  Dyspnea - He does have cardiovascular risk factors and this could be an anginal equivalent. I would like for him to have a stress test but he would not be a little walk on a treadmill. Therefore, I will order a dobutamine echocardiogram  Hypertension - He reports that his blood pressure is labile and has been low at dialysis and his medicines have been reduced. I will defer to his primary provider. No change in therapy is indicated.  PVCs - He does not feel these. No change in therapy is indicated.

## 2012-02-16 ENCOUNTER — Other Ambulatory Visit: Payer: Self-pay | Admitting: Cardiology

## 2012-02-16 ENCOUNTER — Telehealth: Payer: Self-pay

## 2012-02-16 DIAGNOSIS — R0600 Dyspnea, unspecified: Secondary | ICD-10-CM

## 2012-02-16 NOTE — Telephone Encounter (Signed)
dobutamine echocardiogram scheduled for 02-26-2012 at [facility]

## 2012-02-16 NOTE — Telephone Encounter (Signed)
Auth # [id number] exp 03/26/12

## 2012-02-21 ENCOUNTER — Encounter: Payer: Self-pay | Admitting: Cardiology

## 2012-02-21 DIAGNOSIS — R0602 Shortness of breath: Secondary | ICD-10-CM

## 2012-02-28 ENCOUNTER — Telehealth: Payer: Self-pay | Admitting: *Deleted

## 2012-02-28 NOTE — Telephone Encounter (Signed)
Patient informed. 

## 2012-02-28 NOTE — Telephone Encounter (Signed)
Message copied by Eustace Moore on Wed Feb 28, 2012  9:38 AM ------      Message from: Rollene Rotunda      Created: Mon Feb 26, 2012  3:06 PM       Negative study but not at an adequate heart rate.  I will see him back to discuss his symptoms and these results.

## 2012-04-08 ENCOUNTER — Ambulatory Visit (INDEPENDENT_AMBULATORY_CARE_PROVIDER_SITE_OTHER): Payer: Medicare PPO | Admitting: Cardiology

## 2012-04-08 ENCOUNTER — Encounter: Payer: Self-pay | Admitting: Cardiology

## 2012-04-08 VITALS — BP 150/70 | HR 58 | Ht 68.0 in | Wt 213.0 lb

## 2012-04-08 DIAGNOSIS — R0600 Dyspnea, unspecified: Secondary | ICD-10-CM

## 2012-04-08 DIAGNOSIS — R0609 Other forms of dyspnea: Secondary | ICD-10-CM

## 2012-04-08 DIAGNOSIS — R0989 Other specified symptoms and signs involving the circulatory and respiratory systems: Secondary | ICD-10-CM

## 2012-04-08 NOTE — Progress Notes (Signed)
HPI the patient presents for followup of dyspnea. He was having this as described previously. I sent him for a dobutamine echocardiogram which demonstrated no significant abnormalities and no suggestion of ischemia though he had any inadequate heart rate response. Since that time he said his breathing is better. He tried to walk more and has been doing this despite leg pain and using a cane. With this he thinks his breathing is improved. He's not describing PND or orthopnea. She's not been having any chest pressure, neck or arm discomfort. He is not noticing any palpitations.   Allergies  Allergen Reactions  . Ivp Dye (Iodinated Diagnostic Agents)     Unknown  . Ofloxacin Nausea And Vomiting  . Oxycodone     Altered mental status  . Sulfa Antibiotics Rash  . Sulfamethoxazole W-Trimethoprim Rash    Current Outpatient Prescriptions  Medication Sig Dispense Refill  . acetaminophen (TYLENOL) 500 MG tablet Take 500 mg by mouth every 6 (six) hours as needed.      Marland Kitchen allopurinol (ZYLOPRIM) 100 MG tablet Take 100 mg by mouth daily.       . B Complex-C-Zn-Folic Acid (DIALYVITE/ZINC) TABS Take 1 tablet by mouth Daily.      . calcium acetate (PHOSLO) 667 MG capsule Take 667-1,334 mg by mouth 3 (three) times daily with meals. Takes 2 with meals, takes 1 with snacks      . gabapentin (NEURONTIN) 300 MG capsule Take 300 mg by mouth at bedtime as needed. For pain      . HYDROcodone-acetaminophen (NORCO/VICODIN) 5-325 MG per tablet Take 1 tablet by mouth as needed.      . lansoprazole (PREVACID) 15 MG capsule Take 15 mg by mouth daily. Sometimes takes a 2nd dose      . LORazepam (ATIVAN) 0.5 MG tablet Take 0.5 mg by mouth as needed.      . metoprolol succinate (TOPROL-XL) 100 MG 24 hr tablet Take 1 tablet by mouth Daily.      Marland Kitchen PARoxetine (PAXIL) 30 MG tablet Take 30 mg by mouth every morning.      . sevelamer (RENAGEL) 800 MG tablet Take 1,600 mg by mouth 3 (three) times daily with meals. & 1 with  snacks      . triamcinolone cream (KENALOG) 0.1 % Apply 1 application topically 2 (two) times daily as needed.        Past Medical History  Diagnosis Date  . ESRD on dialysis     Left fistula   . Ankle fracture     Left ~08/2011. [place] fracture  . DM (diabetes mellitus)     Per pt, borderline   . Renal cell cancer     Remote. S/p right nephrectomy.   . Melanoma   . HTN (hypertension)   . Neuropathy     Past Surgical History  Procedure Date  . External fixation leg 12/02/2011    Procedure: EXTERNAL FIXATION LEG;  Surgeon: Velna Ochs, MD;  Location: [facility] OR;  Service: Orthopedics;  Laterality: Left;  . I&d extremity 12/02/2011    Procedure: IRRIGATION AND DEBRIDEMENT EXTREMITY;  Surgeon: Velna Ochs, MD;  Location: [facility] OR;  Service: Orthopedics;  Laterality: Left;  . Hardware removal 12/02/2011    Procedure: HARDWARE REMOVAL;  Surgeon: Velna Ochs, MD;  Location: [facility] OR;  Service: Orthopedics;  Laterality: Left;  . Orif ankle fracture 12/07/2011    Procedure: OPEN REDUCTION INTERNAL FIXATION (ORIF) ANKLE FRACTURE;  Surgeon: Budd Palmer, MD;  Location:  [facility] OR;  Service: Orthopedics;  Laterality: Left;  ORIF left pilon    ROS:  As stated in the HPI and negative for all other systems.  PHYSICAL EXAM BP 161/65  Pulse 53  Ht 5\' 8"  (1.727 m)  Wt 209 lb (94.802 kg)  BMI 31.78 kg/m2  SpO2 99% GENERAL:  Well appearing NECK:  No jugular venous distention, waveform within normal limits, carotid upstroke brisk and symmetric, no bruits, no thyromegaly LYMPHATICS:  No cervical, inguinal adenopathy LUNGS:  Clear to auscultation bilaterally HEART:  PMI not displaced or sustained,S1 and S2 within normal limits, no S3, no S4, no clicks, no rubs, no murmurs ABD:  Flat, positive bowel sounds normal in frequency in pitch, no bruits, no rebound, no guarding, no midline pulsatile mass, no hepatomegaly, no splenomegaly EXT:  2 plus pulses throughout, left ankle edema, no cyanosis no  clubbing, left arm AV fistula   ASSESSMENT AND PLAN  Dyspnea -  This seems to be improved. Though the stress test was not inadequate heart rate there was no evidence of ischemia or infarct. No further workup is suggested.  Hypertension -  He reports that his blood pressure is labile and has been low at dialysis and his medicines have been reduced. I will defer to his primary provider. No change in therapy is indicated.   PVCs -  He does not feel these. No change in therapy is indicated.

## 2012-04-08 NOTE — Patient Instructions (Addendum)
Your physician recommends that you schedule a follow-up appointment as needed.   Your physician recommends that you continue on your current medications as directed. Please refer to the Current Medication list given to you today.  

## 2012-05-22 ENCOUNTER — Encounter: Payer: Self-pay | Admitting: Physician Assistant

## 2013-02-14 ENCOUNTER — Telehealth: Payer: Self-pay | Admitting: *Deleted

## 2013-02-14 NOTE — Telephone Encounter (Signed)
Patient was informed to see cardiologist by dialysis center due to sob and low heart rates. Called and spoke with dialysis nurse and she states patient's HR is ranging from 42-77 yesterday during treatment and Tuesday this week HR 40-56 throughout treatments. After treatments done Tuesday HR 51 and BP 144/77 sitting and standing 105/59 on yesterday post treatment sitting BP 170/87 standing 163/79 HR 60. Nurse states they are concerned about HR getting too low during treatments. Nurse reviewed current medication list sent from pharmacy. Please advise for any recommendations about medication adjustments. Patient scheduled to see Dr. Wyline Mood on 03/17/13.

## 2013-02-14 NOTE — Telephone Encounter (Signed)
If he is not having symptoms related to this I would continue the current therapy until he sees Dr. Wyline Mood.

## 2013-02-14 NOTE — Telephone Encounter (Signed)
Patient informed and verbalizes understanding of plan. No c/o of dizziness or feeling like he is going to pass out at this time. Patient informed to let our office know if he starts having symptoms.

## 2013-02-18 ENCOUNTER — Telehealth: Payer: Self-pay | Admitting: Cardiology

## 2013-02-18 NOTE — Telephone Encounter (Signed)
Mrs. [name] [name] nurse with [facility] called and said pt heart rate is in the 40's when on the dialysis. Pt is feeling short of breath on exertion, no chest pain, no n&v, no dizziness. Pt stated that he fell the other day. Appointment scheduled 02-20-2013 at 1:40 with [place], PA

## 2013-02-20 ENCOUNTER — Ambulatory Visit (INDEPENDENT_AMBULATORY_CARE_PROVIDER_SITE_OTHER): Payer: Medicare PPO | Admitting: Physician Assistant

## 2013-02-20 ENCOUNTER — Encounter: Payer: Self-pay | Admitting: Physician Assistant

## 2013-02-20 ENCOUNTER — Ambulatory Visit: Payer: Medicare PPO | Admitting: Cardiology

## 2013-02-20 VITALS — BP 160/85 | HR 55 | Ht 68.0 in | Wt 220.0 lb

## 2013-02-20 DIAGNOSIS — I4901 Ventricular fibrillation: Secondary | ICD-10-CM

## 2013-02-20 DIAGNOSIS — I4949 Other premature depolarization: Secondary | ICD-10-CM

## 2013-02-20 DIAGNOSIS — I498 Other specified cardiac arrhythmias: Secondary | ICD-10-CM

## 2013-02-20 DIAGNOSIS — R0609 Other forms of dyspnea: Secondary | ICD-10-CM | POA: Insufficient documentation

## 2013-02-20 DIAGNOSIS — R001 Bradycardia, unspecified: Secondary | ICD-10-CM | POA: Insufficient documentation

## 2013-02-20 DIAGNOSIS — I493 Ventricular premature depolarization: Secondary | ICD-10-CM | POA: Insufficient documentation

## 2013-02-20 DIAGNOSIS — Z136 Encounter for screening for cardiovascular disorders: Secondary | ICD-10-CM

## 2013-02-20 DIAGNOSIS — R0602 Shortness of breath: Secondary | ICD-10-CM

## 2013-02-20 MED ORDER — METOPROLOL SUCCINATE ER 25 MG PO TB24: 25.0000 mg | ORAL_TABLET | Freq: Every day | ORAL | Status: DC

## 2013-02-20 NOTE — Assessment & Plan Note (Signed)
Will evaluate further with a 24-hour Holter monitor, to rule out marked bradycardia. Will decrease Toprol dose further, from current 50 to 25 mg daily. Of note, patient has documented PVCs, and may require low dose beta blocker therapy for suppression of frequent ventricular ectopy.

## 2013-02-20 NOTE — Assessment & Plan Note (Signed)
Will evaluate further with complete echocardiogram, for reassessment of LVF and rule out of underlying structural abnormalities. Patient has history of NL LVF, with no known CAD. He had an adequate negative dobutamine echocardiogram in August 2013. If echocardiogram is still within NL limits, I would consider a repeat ischemic evaluation to rule out anginal equivalent of his DOE. Patient has several risk factors, notably DM.

## 2013-02-20 NOTE — Progress Notes (Signed)
Primary Cardiologist: Rollene Rotunda, MD   HPI: Patient presents as an add-on for evaluation of bradycardia, reportedly in the 40 bpm range during recent hemodialysis.  Patient presents with no known history of CAD, and had an inadequate negative dobutamine stress echocardiogram prior to his last visit here, in October 2013. Resting echocardiogram indicated NL LVF (60%), with no focal WMAs. He was cleared by Dr. Antoine Poche to return on an as needed basis.  He presents with no interim development of CP, but does suggest significant DOE, as corroborated by his wife. He denies symptoms suggestive of CHF, and completed scheduled hemodialysis session earlier today. He denies tachycardia palpitations, or syncope.  Patient states that his Toprol dose was reduced by half last week, per Dr. Kristian Covey, from 100 to 50 mg daily. He has been on Toprol for many years, for treatment of HTN.  Twelve-lead EKG today, reviewed by me, indicates sinus bradycardia with transient ventricular bigeminy at 55 bpm  Allergies  Allergen Reactions  . Ivp Dye [Iodinated Diagnostic Agents]     Unknown  . Ofloxacin Nausea And Vomiting  . Oxycodone     Altered mental status  . Sulfa Antibiotics Rash  . Sulfamethoxazole W-Trimethoprim Rash    Current Outpatient Prescriptions  Medication Sig Dispense Refill  . acetaminophen (TYLENOL) 500 MG tablet Take 500 mg by mouth every 6 (six) hours as needed.      Marland Kitchen allopurinol (ZYLOPRIM) 100 MG tablet Take 100 mg by mouth 2 (two) times daily.       . B Complex-C-Zn-Folic Acid (DIALYVITE/ZINC) TABS Take 1 tablet by mouth Daily.      . calcium acetate (PHOSLO) 667 MG capsule Takes 2 with meals, takes 1 with snacks      . docusate sodium (COLACE) 100 MG capsule Take 100 mg by mouth daily as needed for constipation.      . gabapentin (NEURONTIN) 300 MG capsule Take 300-600 mg by mouth at bedtime as needed. For pain      . lansoprazole (PREVACID) 15 MG capsule Take 15 mg by mouth daily.  Sometimes takes a 2nd dose      . LORazepam (ATIVAN) 0.5 MG tablet Take 0.5 mg by mouth every 8 (eight) hours as needed.       Marland Kitchen PARoxetine (PAXIL) 30 MG tablet Take 30 mg by mouth every morning.      . sevelamer (RENAGEL) 800 MG tablet Take 1,600 mg by mouth 3 (three) times daily with meals. & 1 with snacks      . triamcinolone cream (KENALOG) 0.1 % Apply 1 application topically 2 (two) times daily as needed.      . metoprolol succinate (TOPROL-XL) 25 MG 24 hr tablet Take 1 tablet (25 mg total) by mouth daily.  30 tablet  6   No current facility-administered medications for this visit.    Past Medical History  Diagnosis Date  . ESRD on dialysis     Left fistula   . Ankle fracture     Left ~08/2011. [place] fracture  . DM (diabetes mellitus)     Per pt, borderline   . Renal cell cancer     Remote. S/p right nephrectomy.   . Melanoma   . HTN (hypertension)   . Neuropathy     Past Surgical History  Procedure Laterality Date  . External fixation leg  12/02/2011    Procedure: EXTERNAL FIXATION LEG;  Surgeon: Velna Ochs, MD;  Location: [facility];  Service: Orthopedics;  Laterality: Left;  .  I&d extremity  12/02/2011    Procedure: IRRIGATION AND DEBRIDEMENT EXTREMITY;  Surgeon: Velna Ochs, MD;  Location: [facility];  Service: Orthopedics;  Laterality: Left;  . Hardware removal  12/02/2011    Procedure: HARDWARE REMOVAL;  Surgeon: Velna Ochs, MD;  Location: [facility];  Service: Orthopedics;  Laterality: Left;  . Orif ankle fracture  12/07/2011    Procedure: OPEN REDUCTION INTERNAL FIXATION (ORIF) ANKLE FRACTURE;  Surgeon: Budd Palmer, MD;  Location: [facility];  Service: Orthopedics;  Laterality: Left;  ORIF left pilon    History   Social History  . Marital Status: Married    Spouse Name: N/A    Number of Children: 2  . Years of Education: N/A   Occupational History  . Retired Pharmacologist.    Social History Main Topics  . Smoking status: Former Smoker -- 0.80 packs/day for 10  years    Types: Cigarettes    Quit date: 07/03/1970  . Smokeless tobacco: Never Used  . Alcohol Use: No  . Drug Use: No  . Sexual Activity: Not on file   Other Topics Concern  . Not on file   Social History Narrative   Lives at home with wife and grandson.    Family History  Problem Relation Age of Onset  . Pancreatic cancer Father   . Dementia Mother     ROS: no nausea, vomiting; no fever, chills; no melena, hematochezia; no claudication  PHYSICAL EXAM: BP 160/85  Pulse 55  Ht 5\' 8"  (1.727 m)  Wt 220 lb (99.791 kg)  BMI 33.46 kg/m2  SpO2 92% GENERAL: 78 year old male, obese; NAD HEENT: NCAT, PERRLA, EOMI; sclera clear; no xanthelasma NECK: palpable bilateral carotid pulses, no bruits; no JVD; no TM LUNGS: CTA bilaterally CARDIAC: RRR with occasional premature beat (S1, S2); no significant murmurs; no rubs or gallops ABDOMEN: soft, protuberant EXTREMETIES: no significant peripheral edema SKIN: warm/dry; no obvious rash/lesions MUSCULOSKELETAL: no joint deformity NEURO: no focal deficit; NL affect   EKG: reviewed and available in Electronic Records   ASSESSMENT & PLAN:  Sinus bradycardia Will evaluate further with a 24-hour Holter monitor, to rule out marked bradycardia. Will decrease Toprol dose further, from current 50 to 25 mg daily. Of note, patient has documented PVCs, and may require low dose beta blocker therapy for suppression of frequent ventricular ectopy.   Ventricular ectopy To be evaluated further with echocardiography, 24-hour Holter monitor, and electrolytes/TSH level. Continue low-dose beta blocker for now, pending review of study results.  Exertional dyspnea Will evaluate further with complete echocardiogram, for reassessment of LVF and rule out of underlying structural abnormalities. Patient has history of NL LVF, with no known CAD. He had an adequate negative dobutamine echocardiogram in August 2013. If echocardiogram is still within NL limits,  I would consider a repeat ischemic evaluation to rule out anginal equivalent of his DOE. Patient has several risk factors, notably DM.    [name] [name], PAC

## 2013-02-20 NOTE — Patient Instructions (Addendum)
Your physician has requested that you have an echocardiogram. Echocardiography is a painless test that uses sound waves to create images of your heart. It provides your doctor with information about the size and shape of your heart and how well your heart's chambers and valves are working. This procedure takes approximately one hour. There are no restrictions for this procedure. Your physician has recommended that you wear a 24 hour holter monitor. Holter monitors are medical devices that record the heart's electrical activity. Doctors most often use these monitors to diagnose arrhythmias. Arrhythmias are problems with the speed or rhythm of the heartbeat. The monitor is a small, portable device. You can wear one while you do your normal daily activities. This is usually used to diagnose what is causing palpitations/syncope (passing out). Your physician has recommended you make the following change in your medication:  Decrease Toprol XL to 25mg  daily - new sent to pharm  Labs for BMET, TSH, BNP, Magnesium   Office will contact with results via phone or letter.    Continue all other medications.   Follow up in  1 week

## 2013-02-20 NOTE — Assessment & Plan Note (Signed)
To be evaluated further with echocardiography, 24-hour Holter monitor, and electrolytes/TSH level. Continue low-dose beta blocker for now, pending review of study results.

## 2013-02-21 ENCOUNTER — Telehealth: Payer: Self-pay | Admitting: *Deleted

## 2013-02-21 NOTE — Telephone Encounter (Signed)
Message copied by Lesle Chris on Fri Feb 21, 2013 11:14 AM ------      Message from: Rande Brunt      Created: Fri Feb 21, 2013 10:57 AM       Judieth Keens, TSH NL ------

## 2013-02-21 NOTE — Telephone Encounter (Signed)
Notes Recorded by Lesle Chris, LPN on 0/98/1191 at 11:14 AM Patient notified and verbalized understanding.   Follow up OV scheduled for 02/28/13 with Gene Serpe, PA.

## 2013-02-24 ENCOUNTER — Ambulatory Visit (HOSPITAL_COMMUNITY)
Admission: RE | Admit: 2013-02-24 | Discharge: 2013-02-24 | Disposition: A | Discharge status: Home / Self Care | Payer: Medicare PPO | Source: Ambulatory Visit | Attending: Physician Assistant | Admitting: Physician Assistant

## 2013-02-24 DIAGNOSIS — R0602 Shortness of breath: Secondary | ICD-10-CM

## 2013-02-24 DIAGNOSIS — I498 Other specified cardiac arrhythmias: Secondary | ICD-10-CM

## 2013-02-24 NOTE — Progress Notes (Signed)
*  PRELIMINARY RESULTS* Echocardiogram 24H Holter monitor has been performed.  [name] [name] 02/24/2013, 2:01 PM

## 2013-02-26 ENCOUNTER — Other Ambulatory Visit (INDEPENDENT_AMBULATORY_CARE_PROVIDER_SITE_OTHER): Payer: Medicare PPO

## 2013-02-26 ENCOUNTER — Other Ambulatory Visit: Payer: Self-pay

## 2013-02-26 DIAGNOSIS — R0602 Shortness of breath: Secondary | ICD-10-CM

## 2013-02-26 DIAGNOSIS — I059 Rheumatic mitral valve disease, unspecified: Secondary | ICD-10-CM

## 2013-02-26 DIAGNOSIS — R001 Bradycardia, unspecified: Secondary | ICD-10-CM

## 2013-02-26 DIAGNOSIS — I369 Nonrheumatic tricuspid valve disorder, unspecified: Secondary | ICD-10-CM

## 2013-02-27 ENCOUNTER — Ambulatory Visit: Payer: Medicare PPO | Admitting: Physician Assistant

## 2013-02-27 ENCOUNTER — Other Ambulatory Visit: Payer: Self-pay | Admitting: *Deleted

## 2013-02-27 DIAGNOSIS — R0602 Shortness of breath: Secondary | ICD-10-CM

## 2013-02-27 DIAGNOSIS — R001 Bradycardia, unspecified: Secondary | ICD-10-CM

## 2013-02-28 ENCOUNTER — Encounter: Payer: Self-pay | Admitting: Physician Assistant

## 2013-02-28 ENCOUNTER — Encounter: Payer: Self-pay | Admitting: *Deleted

## 2013-02-28 ENCOUNTER — Ambulatory Visit: Payer: Medicare PPO | Admitting: Physician Assistant

## 2013-02-28 ENCOUNTER — Ambulatory Visit (INDEPENDENT_AMBULATORY_CARE_PROVIDER_SITE_OTHER): Payer: Medicare PPO | Admitting: Physician Assistant

## 2013-02-28 VITALS — BP 174/66 | HR 68 | Ht 68.0 in | Wt 215.8 lb

## 2013-02-28 DIAGNOSIS — I498 Other specified cardiac arrhythmias: Secondary | ICD-10-CM

## 2013-02-28 DIAGNOSIS — R0602 Shortness of breath: Secondary | ICD-10-CM

## 2013-02-28 DIAGNOSIS — I059 Rheumatic mitral valve disease, unspecified: Secondary | ICD-10-CM

## 2013-02-28 DIAGNOSIS — R001 Bradycardia, unspecified: Secondary | ICD-10-CM

## 2013-02-28 DIAGNOSIS — I340 Nonrheumatic mitral (valve) insufficiency: Secondary | ICD-10-CM

## 2013-02-28 DIAGNOSIS — N186 End stage renal disease: Secondary | ICD-10-CM

## 2013-02-28 DIAGNOSIS — R0609 Other forms of dyspnea: Secondary | ICD-10-CM

## 2013-02-28 NOTE — Assessment & Plan Note (Signed)
Patient remains compliant with his weekly schedule (T/Th/Sat).

## 2013-02-28 NOTE — Assessment & Plan Note (Signed)
Results of recent echocardiogram were reviewed. Patient does not have history of previously documented valvular heart disease. We will continue to monitor this closely, pending followup of repeat ischemic evaluation and PFTs.

## 2013-02-28 NOTE — Assessment & Plan Note (Addendum)
Following review with Dr. Purvis Sheffield of recent echocardiogram study, plan is as follows: Will proceed with PFTs to rule out intrinsic lung disease, as possible etiology for his dyspnea. This is also based on echo findings of mild RV dysfunction and mild/moderate TR. Will also repeat ischemic evaluation, but this time with a nuclear imaging study. This is so as to rule out occult CAD, as possible etiology for his dyspnea. Following completion of these studies, we will have patient return in the next several weeks to followup with Dr. Purvis Sheffield, for review of study results and further recommendations, including possible R/L heart catheterization to exclude significant CAD or primary PHTN.

## 2013-02-28 NOTE — Progress Notes (Signed)
Primary Cardiologist: Rollene Rotunda, MD   HPI: Scheduled 1 week followup, for ongoing evaluation of marked bradycardia (reportedly in the 40 bpm range), during recent hemodialysis.   - Echocardiogram, August 27: EF 55-60%; mild RVD; mild AR; moderate/severe MR; mild/moderate TR   - 24-hour Holter monitor, reviewed by Dr. Diona Browner, revealed: NSR (52-84 BPM), frequent PVCs (15% of total beats), rare couplets and 3 beat NSVT, rare PACs; no pauses or marked bradycardia.   - Laboratory data, August 21: BUN 14, creatinine 3.5, and potassium 3.9. Magnesium, TSH NL. BNP 4900  He returns today reporting palpable improvement in his DOE, stating that he feels "better" overall. He continues to report no CP or tachycardia palpitations.  Allergies  Allergen Reactions  . Ivp Dye [Iodinated Diagnostic Agents]     Unknown  . Ofloxacin Nausea And Vomiting  . Oxycodone     Altered mental status  . Sulfa Antibiotics Rash  . Sulfamethoxazole W-Trimethoprim Rash    Current Outpatient Prescriptions  Medication Sig Dispense Refill  . acetaminophen (TYLENOL) 500 MG tablet Take 500 mg by mouth every 6 (six) hours as needed.      Marland Kitchen allopurinol (ZYLOPRIM) 100 MG tablet Take 100 mg by mouth 2 (two) times daily.       . B Complex-C-Zn-Folic Acid (DIALYVITE/ZINC) TABS Take 1 tablet by mouth Daily.      . calcium acetate (PHOSLO) 667 MG capsule Takes 2 with meals, takes 1 with snacks      . docusate sodium (COLACE) 100 MG capsule Take 100 mg by mouth daily as needed for constipation.      . gabapentin (NEURONTIN) 300 MG capsule Take 300-600 mg by mouth at bedtime as needed. For pain      . lansoprazole (PREVACID) 15 MG capsule Take 15 mg by mouth daily. Sometimes takes a 2nd dose      . LORazepam (ATIVAN) 0.5 MG tablet Take 0.5 mg by mouth every 8 (eight) hours as needed.       . metoprolol succinate (TOPROL-XL) 25 MG 24 hr tablet Take 1 tablet (25 mg total) by mouth daily.  30 tablet  6  . PARoxetine (PAXIL) 30  MG tablet Take 30 mg by mouth every morning.      . sevelamer (RENAGEL) 800 MG tablet Take 1,600 mg by mouth 3 (three) times daily with meals. & 1 with snacks      . triamcinolone cream (KENALOG) 0.1 % Apply 1 application topically 2 (two) times daily as needed.       No current facility-administered medications for this visit.    Past Medical History  Diagnosis Date  . ESRD on dialysis     Left fistula   . Ankle fracture     Left ~08/2011. [place] fracture  . DM (diabetes mellitus)     Per pt, borderline   . Renal cell cancer     Remote. S/p right nephrectomy.   . Melanoma   . HTN (hypertension)   . Neuropathy   . Mitral regurgitation     Moderate/severe, echo, 01/2013    Past Surgical History  Procedure Laterality Date  . External fixation leg  12/02/2011    Procedure: EXTERNAL FIXATION LEG;  Surgeon: Velna Ochs, MD;  Location: [facility];  Service: Orthopedics;  Laterality: Left;  . I&d extremity  12/02/2011    Procedure: IRRIGATION AND DEBRIDEMENT EXTREMITY;  Surgeon: Velna Ochs, MD;  Location: [facility];  Service: Orthopedics;  Laterality: Left;  . Hardware removal  12/02/2011    Procedure: HARDWARE REMOVAL;  Surgeon: Velna Ochs, MD;  Location: [facility];  Service: Orthopedics;  Laterality: Left;  . Orif ankle fracture  12/07/2011    Procedure: OPEN REDUCTION INTERNAL FIXATION (ORIF) ANKLE FRACTURE;  Surgeon: Budd Palmer, MD;  Location: [facility];  Service: Orthopedics;  Laterality: Left;  ORIF left pilon    History   Social History  . Marital Status: Married    Spouse Name: N/A    Number of Children: 2  . Years of Education: N/A   Occupational History  . Retired Pharmacologist.    Social History Main Topics  . Smoking status: Former Smoker -- 0.80 packs/day for 10 years    Types: Cigarettes    Quit date: 07/03/1970  . Smokeless tobacco: Never Used  . Alcohol Use: No  . Drug Use: No  . Sexual Activity: Not on file   Other Topics Concern  . Not on file    Social History Narrative   Lives at home with wife and grandson.    Family History  Problem Relation Age of Onset  . Pancreatic cancer Father   . Dementia Mother     ROS: no nausea, vomiting; no fever, chills; no melena, hematochezia; no claudication  PHYSICAL EXAM: BP 174/66  Pulse 68  Ht 5\' 8"  (1.727 m)  Wt 215 lb 12.8 oz (97.886 kg)  BMI 32.82 kg/m2 GENERAL: 78 year old male, obese; NAD  HEENT: NCAT, PERRLA, EOMI; sclera clear; no xanthelasma  NECK: palpable bilateral carotid pulses, no bruits; no JVD; no TM  LUNGS: diminished breath sounds, no crackles/wheezes  CARDIAC: RRR with occasional premature beat (S1, S2); soft, grade 2-3/6 systolic murmur at left pre-axillary line  ABDOMEN: soft, protuberant  EXTREMETIES: no significant peripheral edema  SKIN: warm/dry; no obvious rash/lesions  MUSCULOSKELETAL: no joint deformity  NEURO: no focal deficit; NL affect    EKG:    ASSESSMENT & PLAN:  Exertional dyspnea Following review with Dr. Purvis Sheffield of recent echocardiogram study, plan is as follows: Will proceed with PFTs to rule out intrinsic lung disease, as possible etiology for his dyspnea. This is also based on echo findings of mild RV dysfunction and mild/moderate TR. Will also repeat ischemic evaluation, but this time with a nuclear imaging study. This is so as to rule out occult CAD, as possible etiology for his dyspnea. Following completion of these studies, we will have patient return in the next several weeks to followup with Dr. Purvis Sheffield, for review of study results and further recommendations, including possible R/L heart catheterization to exclude significant CAD or primary PHTN.  Mitral regurgitation Results of recent echocardiogram were reviewed. Patient does not have history of previously documented valvular heart disease. We will continue to monitor this closely, pending followup of repeat ischemic evaluation and PFTs.  Sinus bradycardia Significantly  improved following recent decrease in beta blocker dose. Will continue low-dose therapy with Toprol XL 25 mg daily, particularly in light of noted frequent PVCs on Holter monitoring.  ESRD on dialysis Patient remains compliant with his weekly schedule (T/Th/Sat).    Gene Kessie Croston, PAC

## 2013-02-28 NOTE — Assessment & Plan Note (Signed)
Significantly improved following recent decrease in beta blocker dose. Will continue low-dose therapy with Toprol XL 25 mg daily, particularly in light of noted frequent PVCs on Holter monitoring.

## 2013-02-28 NOTE — Patient Instructions (Signed)
Your physician has recommended that you have a pulmonary function test. Pulmonary Function Tests are a group of tests that measure how well air moves in and out of your lungs. Your physician has requested that you have a dobutamine myoview. For furth information please visit [email or web address] Please follow instruction sheet, as given. Office will contact with results via phone or letter.   Continue all current medications. Follow up in  1 month

## 2013-03-05 DIAGNOSIS — R0602 Shortness of breath: Secondary | ICD-10-CM

## 2013-03-06 ENCOUNTER — Other Ambulatory Visit: Payer: Self-pay | Admitting: *Deleted

## 2013-03-06 DIAGNOSIS — I498 Other specified cardiac arrhythmias: Secondary | ICD-10-CM

## 2013-03-06 DIAGNOSIS — R0602 Shortness of breath: Secondary | ICD-10-CM

## 2013-03-06 DIAGNOSIS — I059 Rheumatic mitral valve disease, unspecified: Secondary | ICD-10-CM

## 2013-03-10 LAB — PULMONARY FUNCTION TEST

## 2013-03-17 ENCOUNTER — Ambulatory Visit: Payer: Medicare PPO | Admitting: Cardiology

## 2013-03-19 ENCOUNTER — Encounter: Payer: Self-pay | Admitting: *Deleted

## 2013-03-31 ENCOUNTER — Ambulatory Visit (INDEPENDENT_AMBULATORY_CARE_PROVIDER_SITE_OTHER): Payer: Medicare PPO | Admitting: Cardiovascular Disease

## 2013-03-31 VITALS — BP 153/79 | HR 60 | Ht 68.0 in | Wt 218.0 lb

## 2013-03-31 DIAGNOSIS — R001 Bradycardia, unspecified: Secondary | ICD-10-CM

## 2013-03-31 DIAGNOSIS — I498 Other specified cardiac arrhythmias: Secondary | ICD-10-CM

## 2013-03-31 DIAGNOSIS — R0609 Other forms of dyspnea: Secondary | ICD-10-CM

## 2013-03-31 DIAGNOSIS — I059 Rheumatic mitral valve disease, unspecified: Secondary | ICD-10-CM

## 2013-03-31 DIAGNOSIS — I493 Ventricular premature depolarization: Secondary | ICD-10-CM

## 2013-03-31 DIAGNOSIS — R9439 Abnormal result of other cardiovascular function study: Secondary | ICD-10-CM

## 2013-03-31 DIAGNOSIS — N186 End stage renal disease: Secondary | ICD-10-CM

## 2013-03-31 DIAGNOSIS — Z136 Encounter for screening for cardiovascular disorders: Secondary | ICD-10-CM

## 2013-03-31 DIAGNOSIS — I10 Essential (primary) hypertension: Secondary | ICD-10-CM

## 2013-03-31 DIAGNOSIS — I340 Nonrheumatic mitral (valve) insufficiency: Secondary | ICD-10-CM

## 2013-03-31 DIAGNOSIS — I4949 Other premature depolarization: Secondary | ICD-10-CM

## 2013-03-31 MED ORDER — ISOSORBIDE MONONITRATE ER 30 MG PO TB24: 30.0000 mg | ORAL_TABLET | Freq: Every day | ORAL | Status: DC

## 2013-03-31 NOTE — Patient Instructions (Signed)
   Begin Imdur 30mg  daily Continue all other medications.   Follow up in  6-8 weeks

## 2013-03-31 NOTE — Progress Notes (Signed)
Patient ID: [name] [name], male   DOB: [birth year], 78 y.o.   MRN: [id number]   SUBJECTIVE: [name] [name] is here to f/u on the results of cardiac testing for the workup of dyspnea. His stress test was deemed to be low risk, revealing a small, mild, apical lateral defect with at most a mild degree of ischemia. The test was complicated by both motion artifact and frequent PVC's.  He has ESRD and is on dialysis. He also has HTN and diabetes. He was seen by Gene Serpe PA-C on 02/28/13 for bradycardia (40 bpm range) during hemodialysis. A recent monitor showed normal sinus rhythm with frequent PVC's. A recent echocardiogram (02-26-13) showed the following:  - Left ventricle: Diastolic dysfunction noted. Mild posterior wall and moderate septal thickening. Systolic function was normal. The estimated ejection fraction was in the range of 55% to 60%. Doppler parameters are consistent with elevated ventricular end-diastolic filling pressure. - Aortic valve: Mild regurgitation. - Mitral valve: Calcified annulus. Mildly thickened leaflets . Moderate to severe regurgitation. - Left atrium: The atrium was mild to moderately dilated. - Right ventricle: Mildly reduced systolic function. The cavity size was mildly dilated. - Right atrium: The atrium was moderately dilated. - Tricuspid valve: Mild to moderate regurgitation.  He had been having dyspnea on exertion in 01/2013, and thus underwent the aforementioned stress test as well as PFT's. The PFT's showed a minimal obstructive defect, mild restrictive defect, and moderate decrease in diffusion capacity.  He used to walk up to a mile daily. His dyspnea has been occurring over the last 3-4 months. He has several pins and 2 plates in his left ankle which limits his ability to walk, but has been walking a quarter of a mile and if he doesn't over-exert himself, he does alright.  He's never had an MI. His BP has been labile and has recent adjustments in his  metoprolol. He now takes 50 mg daily but had been on 100 mg daily.      Allergies  Allergen Reactions  . Ivp Dye [Iodinated Diagnostic Agents]     Unknown  . Ofloxacin Nausea And Vomiting  . Oxycodone     Altered mental status  . Sulfa Antibiotics Rash  . Sulfamethoxazole W-Trimethoprim Rash    Current Outpatient Prescriptions  Medication Sig Dispense Refill  . acetaminophen (TYLENOL) 500 MG tablet Take 500 mg by mouth every 6 (six) hours as needed.      Marland Kitchen allopurinol (ZYLOPRIM) 100 MG tablet Take 100 mg by mouth 2 (two) times daily.       . B Complex-C-Zn-Folic Acid (DIALYVITE/ZINC) TABS Take 1 tablet by mouth Daily.      . calcium acetate (PHOSLO) 667 MG capsule Takes 2 with meals, takes 1 with snacks      . docusate sodium (COLACE) 100 MG capsule Take 100 mg by mouth daily as needed for constipation.      . gabapentin (NEURONTIN) 300 MG capsule Take 300-600 mg by mouth at bedtime as needed. For pain      . lansoprazole (PREVACID) 15 MG capsule Take 15 mg by mouth daily. Sometimes takes a 2nd dose      . LORazepam (ATIVAN) 0.5 MG tablet Take 0.5 mg by mouth every 8 (eight) hours as needed.       . metoprolol succinate (TOPROL-XL) 25 MG 24 hr tablet Take 50 mg by mouth daily.      Marland Kitchen PARoxetine (PAXIL) 30 MG tablet Take 30 mg by mouth every morning.      Marland Kitchen  sevelamer (RENAGEL) 800 MG tablet Take 1,600 mg by mouth 3 (three) times daily with meals. & 1 with snacks      . triamcinolone cream (KENALOG) 0.1 % Apply 1 application topically 2 (two) times daily as needed.       No current facility-administered medications for this visit.    Past Medical History  Diagnosis Date  . ESRD on dialysis     Left fistula   . Ankle fracture     Left ~08/2011. [place] fracture  . DM (diabetes mellitus)     Per pt, borderline   . Renal cell cancer     Remote. S/p right nephrectomy.   . Melanoma   . HTN (hypertension)   . Neuropathy   . Mitral regurgitation     Moderate/severe, echo,  01/2013    Past Surgical History  Procedure Laterality Date  . External fixation leg  12/02/2011    Procedure: EXTERNAL FIXATION LEG;  Surgeon: Velna Ochs, MD;  Location: [facility];  Service: Orthopedics;  Laterality: Left;  . I&d extremity  12/02/2011    Procedure: IRRIGATION AND DEBRIDEMENT EXTREMITY;  Surgeon: Velna Ochs, MD;  Location: [facility];  Service: Orthopedics;  Laterality: Left;  . Hardware removal  12/02/2011    Procedure: HARDWARE REMOVAL;  Surgeon: Velna Ochs, MD;  Location: [facility];  Service: Orthopedics;  Laterality: Left;  . Orif ankle fracture  12/07/2011    Procedure: OPEN REDUCTION INTERNAL FIXATION (ORIF) ANKLE FRACTURE;  Surgeon: Budd Palmer, MD;  Location: [facility];  Service: Orthopedics;  Laterality: Left;  ORIF left pilon    History   Social History  . Marital Status: Married    Spouse Name: N/A    Number of Children: 2  . Years of Education: N/A   Occupational History  . Retired Pharmacologist.    Social History Main Topics  . Smoking status: Former Smoker -- 0.80 packs/day for 10 years    Types: Cigarettes    Quit date: 07/03/1970  . Smokeless tobacco: Never Used  . Alcohol Use: No  . Drug Use: No  . Sexual Activity: Not on file   Other Topics Concern  . Not on file   Social History Narrative   Lives at home with wife and grandson.     Filed Vitals:   03/31/13 1502  BP: 153/79  Pulse: 60  Height: 5\' 8"  (1.727 m)  Weight: 218 lb (98.884 kg)    PHYSICAL EXAM General: NAD Neck: No JVD, no thyromegaly or thyroid nodule.  Lungs: Clear to auscultation bilaterally with normal respiratory effort. CV: Nondisplaced PMI.  Heart regular S1/S2, +S3, no S4, I/VI holosystolic murmur at base.  No peripheral edema.  No carotid bruit.  Normal pedal pulses.  Abdomen: Soft, nontender, no hepatosplenomegaly, no distention.  Neurologic: Alert and oriented x 3.  Psych: Normal affect. Extremities: No clubbing or cyanosis.   ECG: reviewed and available  in electronic records.      ASSESSMENT AND PLAN: Exertional dyspnea  PFTs were unremarkable, and stress test may have showed a small, mild area of ischemia, but was a low-risk test. It may be in part due to his mitral valve disease, but the small possible area of ischemia could also be contributing. I will start Imdur 30 mg daily. I will f/u with him in 6-8 weeks to see if there has been any improvement in his symptoms. If it is still not resolved at that point, I would consider increasing the  Imdur vs cardiac catheterization.  Mitral regurgitation  Results of recent echocardiogram were reviewed.  Will continue to monitor this closely. The etiology of his dyspnea on exertion may be due to this (moderate to severe MR).  Sinus bradycardia  Significantly improved following recent decrease in beta blocker dose. Will continue low-dose therapy with Toprol XL 25 mg daily, particularly in light of noted frequent PVCs on Holter monitoring.   ESRD on dialysis  Patient remains compliant with his weekly schedule (T/Th/Sat).  HTN Given recent adjustments in metoprolol, I will not not add any medications at present. He had reportedly been experiencing labile BP's with hypotension most recently. However, given his elevated LVEDP by echocardiography, I would want to make sure that this is treated optimally, as this (along with his mitral regurgitation) can be contributing to his exertional dyspnea.    Prentice Docker, M.D., F.A.C.C.

## 2013-05-07 ENCOUNTER — Encounter (HOSPITAL_COMMUNITY): Payer: Self-pay

## 2013-05-07 ENCOUNTER — Other Ambulatory Visit: Payer: Self-pay | Admitting: *Deleted

## 2013-05-07 ENCOUNTER — Encounter: Payer: Self-pay | Admitting: *Deleted

## 2013-05-12 ENCOUNTER — Ambulatory Visit: Payer: Medicare PPO | Admitting: Cardiovascular Disease

## 2013-05-12 ENCOUNTER — Encounter (HOSPITAL_COMMUNITY): Payer: Self-pay | Admitting: *Deleted

## 2013-05-12 ENCOUNTER — Encounter (HOSPITAL_COMMUNITY)
Admission: RE | Disposition: A | Discharge status: Home / Self Care | Payer: Self-pay | Source: Ambulatory Visit | Attending: Vascular Surgery

## 2013-05-12 ENCOUNTER — Ambulatory Visit (HOSPITAL_COMMUNITY)
Admission: RE | Admit: 2013-05-12 | Discharge: 2013-05-12 | Disposition: A | Discharge status: Home / Self Care | Payer: Medicare PPO | Source: Ambulatory Visit | Attending: Vascular Surgery | Admitting: Vascular Surgery

## 2013-05-12 DIAGNOSIS — T82898A Other specified complication of vascular prosthetic devices, implants and grafts, initial encounter: Secondary | ICD-10-CM

## 2013-05-12 DIAGNOSIS — N186 End stage renal disease: Secondary | ICD-10-CM | POA: Diagnosis not present

## 2013-05-12 DIAGNOSIS — I120 Hypertensive chronic kidney disease with stage 5 chronic kidney disease or end stage renal disease: Secondary | ICD-10-CM | POA: Diagnosis not present

## 2013-05-12 DIAGNOSIS — Z85820 Personal history of malignant melanoma of skin: Secondary | ICD-10-CM | POA: Insufficient documentation

## 2013-05-12 DIAGNOSIS — I868 Varicose veins of other specified sites: Secondary | ICD-10-CM | POA: Diagnosis not present

## 2013-05-12 DIAGNOSIS — E119 Type 2 diabetes mellitus without complications: Secondary | ICD-10-CM | POA: Insufficient documentation

## 2013-05-12 DIAGNOSIS — I059 Rheumatic mitral valve disease, unspecified: Secondary | ICD-10-CM | POA: Insufficient documentation

## 2013-05-12 DIAGNOSIS — Y832 Surgical operation with anastomosis, bypass or graft as the cause of abnormal reaction of the patient, or of later complication, without mention of misadventure at the time of the procedure: Secondary | ICD-10-CM | POA: Insufficient documentation

## 2013-05-12 DIAGNOSIS — Z905 Acquired absence of kidney: Secondary | ICD-10-CM | POA: Diagnosis not present

## 2013-05-12 DIAGNOSIS — Z992 Dependence on renal dialysis: Secondary | ICD-10-CM | POA: Diagnosis not present

## 2013-05-12 DIAGNOSIS — Z85528 Personal history of other malignant neoplasm of kidney: Secondary | ICD-10-CM | POA: Insufficient documentation

## 2013-05-12 HISTORY — PX: FISTULOGRAM: SHX5832

## 2013-05-12 LAB — POCT I-STAT, CHEM 8
BUN: 38 mg/dL — ABNORMAL HIGH (ref 6–23)
Chloride: 101 mEq/L (ref 96–112)
HCT: 41 % (ref 39.0–52.0)
Sodium: 138 mEq/L (ref 135–145)
TCO2: 29 mmol/L (ref 0–100)

## 2013-05-12 SURGERY — FISTULOGRAM
Anesthesia: LOCAL

## 2013-05-12 MED ORDER — SODIUM CHLORIDE 0.9 % IJ SOLN
3.0000 mL | INTRAMUSCULAR | Status: DC | PRN
  Administered 2013-05-12: 3 mL via INTRAVENOUS

## 2013-05-12 MED ORDER — LIDOCAINE HCL (PF) 1 % IJ SOLN
INTRAMUSCULAR | Status: AC
  Filled 2013-05-12: qty 30

## 2013-05-12 MED ORDER — FENTANYL CITRATE 0.05 MG/ML IJ SOLN
INTRAMUSCULAR | Status: AC
  Filled 2013-05-12: qty 2

## 2013-05-12 MED ORDER — METHYLPREDNISOLONE SODIUM SUCC 125 MG IJ SOLR
125.0000 mg | INTRAMUSCULAR | Status: AC
  Administered 2013-05-12: 125 mg via INTRAVENOUS
  Filled 2013-05-12: qty 2

## 2013-05-12 MED ORDER — HEPARIN (PORCINE) IN NACL 2-0.9 UNIT/ML-% IJ SOLN
INTRAMUSCULAR | Status: AC
  Filled 2013-05-12: qty 500

## 2013-05-12 MED ORDER — DIPHENHYDRAMINE HCL 50 MG/ML IJ SOLN
25.0000 mg | INTRAMUSCULAR | Status: AC
Start: 2013-05-12 — End: 2013-05-12
  Administered 2013-05-12: 25 mg via INTRAVENOUS
  Filled 2013-05-12: qty 1

## 2013-05-12 MED ORDER — FAMOTIDINE IN NACL 20-0.9 MG/50ML-% IV SOLN
20.0000 mg | INTRAVENOUS | Status: AC
  Administered 2013-05-12: 20 mg via INTRAVENOUS
  Filled 2013-05-12: qty 50

## 2013-05-12 NOTE — Op Note (Signed)
PATIENT: [name] [name]   MRN: [id number] DOB: 17-Jul-1934    DATE OF PROCEDURE: 05/12/2013  INDICATIONS: MATHAN DARROCH is a 78 y.o. male who was sent from the dialysis center because of a poorly functioning left radiocephalic AV fistula was placed approximately 3 years ago.  PROCEDURE:  1. Ultrasound-guided access to the left radial cephalic AV fistula 2. fistulogram left radiocephalic AV fistula  SURGEON: Di Kindle. Edilia Bo, MD, FACS  ANESTHESIA: local with sedation   EBL: minimal  TECHNIQUE: Patient was taken to the peripheral vascular lab and received 50 mcg of fentanyl. The left arm was prepped and draped in usual sterile fashion. Under ultrasound guidance, after the skin was anesthetized, the proximal left radial cephalic AV fistula was cannulated and a micropuncture sheath introduced over a wire. Fistulogram was then obtained and the fistula was evaluated from the cannulation site to the central veins. A blood pressure cuff was then inflated to allow reflux into the proximal fistula and to visualize the arterial anastomosis. At the completion of the procedure, the micropuncture sheath was removed and pressure held for hemostasis. No immediate complications were noted.  FINDINGS:  1. The left radiocephalic fistula is significantly dilated with 2 aneurysmal segments. There is one competing branch in the proximal fistula. The fistula in these into the basilic system with the cephalic vein in the upper arm being a branch of this. There is narrowing and irregularity at the proximal anastomosis I think would be best addressed with surgical revision.  CLINICAL NOTE: The patient will be scheduled for surgical revision of the proximal fistula. I will likely reimplant the more proximal fistula where it is larger into the more proximal radial artery. I will also ligate the 1 competing branches in the proximal fistula. Be scheduled for a nondialysis day.  Waverly Ferrari, MD, FACS [facility]  DATE OF DICTATION:   05/12/2013

## 2013-05-12 NOTE — H&P (Addendum)
[facility] [place]  Patient name: [name] [name] MRN: [id number] DOB: 1934-07-07 Sex: male  REASON FOR CONSULT: for fistulogram  HPI: [name] [name] is a 78 y.o. male presents for fistulogram. He dialyzes T-T-S. He states that the fistula has not been working well and that the kidney center set him up for a fistulogram. I do not have any records from the kidney center.  Past Medical History  Diagnosis Date  . ESRD on dialysis     Left fistula   . Ankle fracture     Left ~08/2011. Massineuve fracture  . DM (diabetes mellitus)     Per pt, borderline   . Renal cell cancer     Remote. S/p right nephrectomy.   . Melanoma   . HTN (hypertension)   . Neuropathy   . Mitral regurgitation     Moderate/severe, echo, 01/2013   Family History  Problem Relation Age of Onset  . Pancreatic cancer Father   . Dementia Mother    SOCIAL HISTORY: History  Substance Use Topics  . Smoking status: Former Smoker -- 0.80 packs/day for 10 years    Types: Cigarettes    Quit date: 07/03/1970  . Smokeless tobacco: Never Used  . Alcohol Use: No   Allergies  Allergen Reactions  . Ivp Dye [Iodinated Diagnostic Agents]     Unknown  . Ofloxacin Nausea And Vomiting  . Oxycodone     Altered mental status  . Sulfa Antibiotics Rash  . Sulfamethoxazole-Trimethoprim Rash   Current Facility-Administered Medications  Medication Dose Route Frequency Provider Last Rate Last Dose  . diphenhydrAMINE (BENADRYL) injection 25 mg  25 mg Intravenous On Call Chuck Hint, MD      . famotidine (PEPCID) IVPB 20 mg  20 mg Intravenous On Call Chuck Hint, MD      . methylPREDNISolone sodium succinate (SOLU-MEDROL) 125 mg/2 mL injection 125 mg  125 mg Intravenous On Call Chuck Hint, MD      . sodium chloride 0.9 % injection 3 mL  3 mL Intravenous PRN Chuck Hint, MD       REVIEW OF SYSTEMS: Arly.Keller ] denotes positive finding; [  ] denotes negative  finding CARDIOVASCULAR:  [ ]  chest pain   [ ]  chest pressure   [ ]  palpitations   [ ]  orthopnea   [ ]  dyspnea on exertion   [ ]  claudication   [ ]  rest pain   [ ]  DVT   [ ]  phlebitis PULMONARY:   [ ]  productive cough   [ ]  asthma   [ ]  wheezing NEUROLOGIC:   [ ]  weakness  [ ]  paresthesias  [ ]  aphasia  [ ]  amaurosis  [ ]  dizziness HEMATOLOGIC:   [ ]  bleeding problems   [ ]  clotting disorders MUSCULOSKELETAL:  [ ]  joint pain   [ ]  joint swelling [ ]  leg swelling GASTROINTESTINAL: [ ]   blood in stool  [ ]   hematemesis GENITOURINARY:  [ ]   dysuria  [ ]   hematuria PSYCHIATRIC:  [ ]  history of major depression INTEGUMENTARY:  [ ]  rashes  [ ]  ulcers CONSTITUTIONAL:  [ ]  fever   [ ]  chills  PHYSICAL EXAM: Filed Vitals:   05/12/13 0910  Pulse: 63  Temp: 97.6 F (36.4 C)  TempSrc: Oral  Resp: 18  Height: 5\' 8"  (1.727 m)  Weight: 218 lb (98.884 kg)  SpO2: 95%   Body mass index is 33.15 kg/(m^2). GENERAL: The patient  is a well-nourished male, in no acute distress. The vital signs are documented above. CARDIOVASCULAR: There is a regular rate and rhythm.  PULMONARY: There is good air exchange bilaterally without wheezing or rales. 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 The thrill was somewhat weak. SKIN: There are no ulcers or rashes noted. PSYCHIATRIC: The patient has a normal affect.  DATA:  Lab Results  Component Value Date   WBC 8.2 01/04/2012   HGB 13.9 05/12/2013   HCT 41.0 05/12/2013   MCV 97.2 01/04/2012   PLT 191 01/04/2012   Lab Results  Component Value Date   NA 138 05/12/2013   K 4.3 05/12/2013   CL 101 05/12/2013   CO2 22 01/04/2012   Lab Results  Component Value Date   CREATININE 5.20* 05/12/2013   Lab Results  Component Value Date   INR 1.07 12/02/2011   MEDICAL ISSUES: Plan fistulogram. Further recommendations pending these results.   DICKSON,CHRISTOPHER S [facility] [place] [facility]: [phone]

## 2013-05-14 ENCOUNTER — Other Ambulatory Visit: Payer: Self-pay | Admitting: Cardiology

## 2013-05-14 ENCOUNTER — Other Ambulatory Visit: Payer: Self-pay | Admitting: *Deleted

## 2013-05-14 ENCOUNTER — Encounter: Payer: Self-pay | Admitting: *Deleted

## 2013-05-14 MED ORDER — METOPROLOL SUCCINATE ER 100 MG PO TB24: ORAL_TABLET | ORAL | Status: DC

## 2013-05-14 MED ORDER — METOPROLOL SUCCINATE ER 100 MG PO TB24: 50.0000 mg | ORAL_TABLET | Freq: Every day | ORAL | Status: DC

## 2013-05-21 ENCOUNTER — Encounter (HOSPITAL_COMMUNITY): Payer: Self-pay | Admitting: *Deleted

## 2013-05-21 NOTE — Progress Notes (Signed)
05/21/13 1612  OBSTRUCTIVE SLEEP APNEA  Have you ever been diagnosed with sleep apnea through a sleep study? No  Do you snore loudly (loud enough to be heard through closed doors)?  1  Do you often feel tired, fatigued, or sleepy during the daytime? 1  Has anyone observed you stop breathing during your sleep? 0  Do you have, or are you being treated for high blood pressure? 1  BMI more than 35 kg/m2? 0  Age over 76 years old? 1  Neck circumference greater than 40 cm/18 inches? 0  Gender: 1  Obstructive Sleep Apnea Score 5  Score 4 or greater  Results sent to PCP

## 2013-05-21 NOTE — Progress Notes (Signed)
Pt chart forwarded to [place], [place] ( anesthesia) to review EKG, echo, and stress test results.

## 2013-05-22 MED ORDER — DEXTROSE 5 % IV SOLN
1.5000 g | INTRAVENOUS | Status: DC
  Filled 2013-05-22: qty 1.5

## 2013-05-22 NOTE — Progress Notes (Signed)
Anesthesia Chart Review:  Patient is a 78 year old male scheduled for revision of left radiocephalic AVF on 05/23/13 by Dr. Edilia Bo. The fistula was created approximately 3 years ago and is now poorly functioning at dialysis.  Fistulogram on 05/12/13 showed the left radiocephalic fistula significantly dilated with 2 aneurysmal segments and one competing branch in the proximal fistula. There was also narrowing and irregularity at the proximal anastomosis.  Dr. Edilia Bo recommended surgical revision with likely reimplantation of the more proximal fistula into the more proximal radial artery and ligation of the competing branch. He is scheduled to be a same day work-up. Case is posted for MAC.  History includes ESRD with hemodialysis TTS, renal cell carcinoma s/p right nephrectomy, melanoma, HTN, DM2, neuropathy, moderate to severe MR by 01/2013 echo, former smoker, obesity. He is undergoing a cardiology evaluation for dyspnea with last visit by Dr. Prentice Docker on 03/31/13.  The plan at that time indicated that Imdur would be initiated with plans to re-evaluate in 6-8 weeks and if no improvement consider increasing dose versus cardiac catheterization.  MR could also be a contributing factor. His next cardiology visit is scheduled for 06/02/13. PCP is listed as Dr. Wyvonnia Lora.  EKG on 02/20/13 showed SB @ 55 bpm, frequent PVC's, RSR prime in V1, diffuse ST depression, consider subendocardial injury/ischemia.  Nuclear stress test on 03/05/13 [facility]) showed no diagnostic ST segment abnormalities, frequent PVC's, no sustained arrhythmias, no chest pain, hypertensive at baseline with normal response to Lexiscan, adequate level of stress was achieved.  Abnormal LV perfusion.  Small, mild intensity, reversible apical lateral defect, cannot exclude a small region of ischemia.  Study is affected by motion and also frequent PVCs.  Mild LV dilation. End diastolic volume was .  Calculated post  stress EF 49%. TID ratio 0.99.  This is a low risk study.  Holter monitor red on 02/25/13 showed SR with rate 52-84.  Frequent PVC's, 15% total beats.  Rare couplets and 3 beat SVT. No pauses or marked bradycardia.  Echo on 02/26/13 showed: - Left ventricle: Diastolic dysfunction noted. Mild posterior wall and moderate septal thickening. Systolic function was normal. The estimated ejection fraction was in the range of 55% to 60%. Doppler parameters are consistent with elevated ventricular end-diastolic filling pressure. - Aortic valve: Mild regurgitation. - Mitral valve: Calcified annulus. Mildly thickened leaflets . Moderate to severe regurgitation. - Left atrium: The atrium was mild to moderately dilated. - Right ventricle: Mildly reduced systolic function. The cavity size was mildly dilated. - Right atrium: The atrium was moderately dilated. - Tricuspid valve: Mild to moderate regurgitation.  PFT's on 03/10/13 showed FVC 3.55 (71%), FEV1 2.77 (69%), DLCO 21.4 (55%), a minimal obstructive defect, mild restrictive defect, and moderate decrease in diffusion capacity.   He will need CXR and labs on the day of surgery.  I attempted to contact Dr. Darl Householder, but he is on vacation this week.  Patient was in hemodialysis this morning.  I did review above with anesthesiologist Dr. Katrinka Blazing who requested that I call patient this afternoon to confirm that he has had no worsening or new symptoms.  If none, it was anticipated that he could proceed with this procedure. I just spoke with patient.  Overall, he has been doing well with hemodialysis.  Occasionally, his BP has been elevated.  Glucose readings have been good.  He continues to deny chest pain and SOB at rest.  His DOE is stable--it is not worse--and could even be  somewhat better but it has been difficult for him to gauge since he has not really been able to exercise/walk extended distances for the past several weeks due to dialysis, weather, and family  illness. He sleeps on 2-3 pillows, but this is not new.  His weights have been stable.    His symptoms have been stable. He continues to deny chest pain, SOB at rest, and has been tolerating dialysis. Patient will be evaluated and labs reviewed on the day of surgery. Definitive anesthesia plan to be determined at that time.  Velna Ochs [facility] Short Stay Center/Anesthesiology Phone [phone] 05/22/2013 3:27 PM

## 2013-05-23 ENCOUNTER — Ambulatory Visit (HOSPITAL_COMMUNITY): Payer: Medicare PPO

## 2013-05-23 ENCOUNTER — Encounter (HOSPITAL_COMMUNITY): Payer: Self-pay | Admitting: *Deleted

## 2013-05-23 ENCOUNTER — Encounter (HOSPITAL_COMMUNITY)
Admission: RE | Disposition: A | Discharge status: Home / Self Care | Payer: Self-pay | Source: Ambulatory Visit | Attending: Vascular Surgery

## 2013-05-23 ENCOUNTER — Ambulatory Visit (HOSPITAL_COMMUNITY)
Admission: RE | Admit: 2013-05-23 | Discharge: 2013-05-23 | Disposition: A | Discharge status: Home / Self Care | Payer: Medicare PPO | Source: Ambulatory Visit | Attending: Vascular Surgery | Admitting: Vascular Surgery

## 2013-05-23 ENCOUNTER — Encounter (HOSPITAL_COMMUNITY): Payer: Medicare PPO | Admitting: Vascular Surgery

## 2013-05-23 ENCOUNTER — Ambulatory Visit (HOSPITAL_COMMUNITY): Payer: Medicare PPO | Admitting: Vascular Surgery

## 2013-05-23 DIAGNOSIS — N186 End stage renal disease: Secondary | ICD-10-CM | POA: Insufficient documentation

## 2013-05-23 DIAGNOSIS — E119 Type 2 diabetes mellitus without complications: Secondary | ICD-10-CM | POA: Insufficient documentation

## 2013-05-23 DIAGNOSIS — Z85528 Personal history of other malignant neoplasm of kidney: Secondary | ICD-10-CM | POA: Insufficient documentation

## 2013-05-23 DIAGNOSIS — Z85820 Personal history of malignant melanoma of skin: Secondary | ICD-10-CM | POA: Insufficient documentation

## 2013-05-23 DIAGNOSIS — Y832 Surgical operation with anastomosis, bypass or graft as the cause of abnormal reaction of the patient, or of later complication, without mention of misadventure at the time of the procedure: Secondary | ICD-10-CM | POA: Insufficient documentation

## 2013-05-23 DIAGNOSIS — I059 Rheumatic mitral valve disease, unspecified: Secondary | ICD-10-CM | POA: Insufficient documentation

## 2013-05-23 DIAGNOSIS — Z992 Dependence on renal dialysis: Secondary | ICD-10-CM | POA: Insufficient documentation

## 2013-05-23 DIAGNOSIS — T82898A Other specified complication of vascular prosthetic devices, implants and grafts, initial encounter: Secondary | ICD-10-CM

## 2013-05-23 DIAGNOSIS — Z87891 Personal history of nicotine dependence: Secondary | ICD-10-CM | POA: Insufficient documentation

## 2013-05-23 DIAGNOSIS — Z905 Acquired absence of kidney: Secondary | ICD-10-CM | POA: Insufficient documentation

## 2013-05-23 DIAGNOSIS — I120 Hypertensive chronic kidney disease with stage 5 chronic kidney disease or end stage renal disease: Secondary | ICD-10-CM | POA: Insufficient documentation

## 2013-05-23 HISTORY — DX: Depression, unspecified: F32.A

## 2013-05-23 HISTORY — DX: Unspecified osteoarthritis, unspecified site: M19.90

## 2013-05-23 HISTORY — DX: Major depressive disorder, single episode, unspecified: F32.9

## 2013-05-23 HISTORY — DX: Rash and other nonspecific skin eruption: R21

## 2013-05-23 HISTORY — DX: Headache: R51

## 2013-05-23 HISTORY — DX: Endocarditis, valve unspecified: I38

## 2013-05-23 HISTORY — PX: REVISON OF ARTERIOVENOUS FISTULA: SHX6074

## 2013-05-23 LAB — POCT I-STAT 4, (NA,K, GLUC, HGB,HCT)
Hemoglobin: 15 g/dL (ref 13.0–17.0)
Potassium: 3.8 mEq/L (ref 3.5–5.1)

## 2013-05-23 LAB — GLUCOSE, CAPILLARY: Glucose-Capillary: 80 mg/dL (ref 70–99)

## 2013-05-23 SURGERY — REVISON OF ARTERIOVENOUS FISTULA
Anesthesia: General | Site: Arm Lower | Laterality: Left | Wound class: Clean

## 2013-05-23 MED ORDER — EPHEDRINE SULFATE 50 MG/ML IJ SOLN
INTRAMUSCULAR | Status: DC | PRN
  Administered 2013-05-23 (×2): 5 mg via INTRAVENOUS

## 2013-05-23 MED ORDER — LIDOCAINE HCL (PF) 1 % IJ SOLN
INTRAMUSCULAR | Status: AC
  Filled 2013-05-23: qty 30

## 2013-05-23 MED ORDER — PAPAVERINE HCL 30 MG/ML IJ SOLN
INTRAMUSCULAR | Status: AC
  Filled 2013-05-23: qty 2

## 2013-05-23 MED ORDER — SODIUM CHLORIDE 0.9 % IR SOLN
Status: DC | PRN
  Administered 2013-05-23: 15:00:00

## 2013-05-23 MED ORDER — ONDANSETRON HCL 4 MG/2ML IJ SOLN
INTRAMUSCULAR | Status: DC | PRN
  Administered 2013-05-23: 4 mg via INTRAVENOUS

## 2013-05-23 MED ORDER — DEXTROSE 5 % IV SOLN
1.5000 g | INTRAVENOUS | Status: DC | PRN
  Administered 2013-05-23: 1.5 g via INTRAVENOUS

## 2013-05-23 MED ORDER — GLYCOPYRROLATE 0.2 MG/ML IJ SOLN
INTRAMUSCULAR | Status: DC | PRN
  Administered 2013-05-23: 0.2 mg via INTRAVENOUS

## 2013-05-23 MED ORDER — LIDOCAINE HCL (CARDIAC) 20 MG/ML IV SOLN
INTRAVENOUS | Status: DC | PRN
  Administered 2013-05-23: 100 mg via INTRAVENOUS

## 2013-05-23 MED ORDER — FENTANYL CITRATE 0.05 MG/ML IJ SOLN: 50.0000 ug | Freq: Once | INTRAMUSCULAR | Status: DC

## 2013-05-23 MED ORDER — MIDAZOLAM HCL 2 MG/2ML IJ SOLN: 1.0000 mg | INTRAMUSCULAR | Status: DC | PRN

## 2013-05-23 MED ORDER — SODIUM CHLORIDE 0.9 % IV SOLN: INTRAVENOUS | Status: DC

## 2013-05-23 MED ORDER — HEPARIN SODIUM (PORCINE) 1000 UNIT/ML IJ SOLN
INTRAMUSCULAR | Status: DC | PRN
  Administered 2013-05-23: 7000 [IU] via INTRAVENOUS

## 2013-05-23 MED ORDER — PROTAMINE SULFATE 10 MG/ML IV SOLN
INTRAVENOUS | Status: DC | PRN
  Administered 2013-05-23: 40 mg via INTRAVENOUS

## 2013-05-23 MED ORDER — MIDAZOLAM HCL 5 MG/5ML IJ SOLN
INTRAMUSCULAR | Status: DC | PRN
  Administered 2013-05-23: 1 mg via INTRAVENOUS

## 2013-05-23 MED ORDER — PROPOFOL 10 MG/ML IV BOLUS
INTRAVENOUS | Status: DC | PRN
  Administered 2013-05-23: 100 mg via INTRAVENOUS

## 2013-05-23 MED ORDER — 0.9 % SODIUM CHLORIDE (POUR BTL) OPTIME
TOPICAL | Status: DC | PRN
  Administered 2013-05-23: 1000 mL

## 2013-05-23 MED ORDER — SODIUM CHLORIDE 0.9 % IV SOLN
INTRAVENOUS | Status: DC
  Administered 2013-05-23 (×2): via INTRAVENOUS

## 2013-05-23 MED ORDER — THROMBIN 20000 UNITS EX SOLR
CUTANEOUS | Status: AC
  Filled 2013-05-23: qty 20000

## 2013-05-23 MED ORDER — ARTIFICIAL TEARS OP OINT
TOPICAL_OINTMENT | OPHTHALMIC | Status: DC | PRN
  Administered 2013-05-23: 1 via OPHTHALMIC

## 2013-05-23 MED ORDER — TRAMADOL HCL 50 MG PO TABS: 50.0000 mg | ORAL_TABLET | Freq: Four times a day (QID) | ORAL | Status: DC | PRN

## 2013-05-23 MED ORDER — FENTANYL CITRATE 0.05 MG/ML IJ SOLN: 25.0000 ug | INTRAMUSCULAR | Status: DC | PRN

## 2013-05-23 MED ORDER — FENTANYL CITRATE 0.05 MG/ML IJ SOLN
INTRAMUSCULAR | Status: DC | PRN
  Administered 2013-05-23 (×4): 25 ug via INTRAVENOUS
  Administered 2013-05-23 (×2): 50 ug via INTRAVENOUS

## 2013-05-23 SURGICAL SUPPLY — 30 items
CANISTER SUCTION 2500CC (MISCELLANEOUS) ×2 IMPLANT
CLIP TI MEDIUM 6 (CLIP) ×2 IMPLANT
CLIP TI WIDE RED SMALL 6 (CLIP) ×4 IMPLANT
COVER PROBE W GEL 5X96 (DRAPES) ×2 IMPLANT
COVER SURGICAL LIGHT HANDLE (MISCELLANEOUS) ×2 IMPLANT
DERMABOND ADHESIVE PROPEN (GAUZE/BANDAGES/DRESSINGS) ×1
DERMABOND ADVANCED (GAUZE/BANDAGES/DRESSINGS) ×1
DERMABOND ADVANCED .7 DNX12 (GAUZE/BANDAGES/DRESSINGS) ×1 IMPLANT
DERMABOND ADVANCED .7 DNX6 (GAUZE/BANDAGES/DRESSINGS) ×1 IMPLANT
ELECT REM PT RETURN 9FT ADLT (ELECTROSURGICAL) ×2
ELECTRODE REM PT RTRN 9FT ADLT (ELECTROSURGICAL) ×1 IMPLANT
GLOVE BIO SURGEON STRL SZ7.5 (GLOVE) ×2 IMPLANT
GLOVE BIOGEL PI IND STRL 8 (GLOVE) ×1 IMPLANT
GLOVE BIOGEL PI INDICATOR 8 (GLOVE) ×1
GOWN STRL NON-REIN LRG LVL3 (GOWN DISPOSABLE) ×6 IMPLANT
KIT BASIN OR (CUSTOM PROCEDURE TRAY) ×2 IMPLANT
KIT ROOM TURNOVER OR (KITS) ×2 IMPLANT
NS IRRIG 1000ML POUR BTL (IV SOLUTION) ×2 IMPLANT
PACK CV ACCESS (CUSTOM PROCEDURE TRAY) ×2 IMPLANT
PAD ARMBOARD 7.5X6 YLW CONV (MISCELLANEOUS) ×4 IMPLANT
SPONGE GAUZE 4X4 12PLY (GAUZE/BANDAGES/DRESSINGS) ×2 IMPLANT
SPONGE SURGIFOAM ABS GEL 100 (HEMOSTASIS) IMPLANT
SUT PROLENE 6 0 BV (SUTURE) ×2 IMPLANT
SUT VIC AB 3-0 SH 27 (SUTURE) ×1
SUT VIC AB 3-0 SH 27X BRD (SUTURE) ×1 IMPLANT
SUT VICRYL 4-0 PS2 18IN ABS (SUTURE) ×2 IMPLANT
TOWEL OR 17X24 6PK STRL BLUE (TOWEL DISPOSABLE) ×2 IMPLANT
TOWEL OR 17X26 10 PK STRL BLUE (TOWEL DISPOSABLE) ×2 IMPLANT
UNDERPAD 30X30 INCONTINENT (UNDERPADS AND DIAPERS) ×2 IMPLANT
WATER STERILE IRR 1000ML POUR (IV SOLUTION) ×2 IMPLANT

## 2013-05-23 NOTE — Transfer of Care (Signed)
Immediate Anesthesia Transfer of Care Note  Patient: [name] [name]  Procedure(s) Performed: Procedure(s): REVISON OF ARTERIOVENOUS FISTULA (Left)  Patient Location: PACU  Anesthesia Type:General  Level of Consciousness: awake, alert  and oriented  Airway & Oxygen Therapy: Patient Spontanous Breathing and Patient connected to face mask oxygen  Post-op Assessment: Report given to PACU RN  Post vital signs: Reviewed and stable  Complications: No apparent anesthesia complications

## 2013-05-23 NOTE — Interval H&P Note (Signed)
History and Physical Interval Note:  05/23/2013 2:32 PM  [name] [name]  has presented today for surgery, with the diagnosis of ESRD,NONMATURING AVF  The various methods of treatment have been discussed with the patient and family. After consideration of risks, benefits and other options for treatment, the patient has consented to  Procedure(s): REVISON OF ARTERIOVENOUS FISTULA (Left) as a surgical intervention .  The patient's history has been reviewed, patient examined, no change in status, stable for surgery.  I have reviewed the patient's chart and labs.  Questions were answered to the patient's satisfaction.     Murrell Dome S

## 2013-05-23 NOTE — H&P (View-Only) (Signed)
[facility] [place]  Patient name: [name] [name] MRN: [id number] DOB: 01/16/1935 Sex: male  REASON FOR CONSULT: for fistulogram  HPI: [name] [name] is a 78 y.o. male presents for fistulogram. He dialyzes T-T-S. He states that the fistula has not been working well and that the kidney center set him up for a fistulogram. I do not have any records from the kidney center.  Past Medical History  Diagnosis Date  . ESRD on dialysis     Left fistula   . Ankle fracture     Left ~08/2011. Massineuve fracture  . DM (diabetes mellitus)     Per pt, borderline   . Renal cell cancer     Remote. S/p right nephrectomy.   . Melanoma   . HTN (hypertension)   . Neuropathy   . Mitral regurgitation     Moderate/severe, echo, 01/2013   Family History  Problem Relation Age of Onset  . Pancreatic cancer Father   . Dementia Mother    SOCIAL HISTORY: History  Substance Use Topics  . Smoking status: Former Smoker -- 0.80 packs/day for 10 years    Types: Cigarettes    Quit date: 07/03/1970  . Smokeless tobacco: Never Used  . Alcohol Use: No   Allergies  Allergen Reactions  . Ivp Dye [Iodinated Diagnostic Agents]     Unknown  . Ofloxacin Nausea And Vomiting  . Oxycodone     Altered mental status  . Sulfa Antibiotics Rash  . Sulfamethoxazole-Trimethoprim Rash   Current Facility-Administered Medications  Medication Dose Route Frequency Provider Last Rate Last Dose  . diphenhydrAMINE (BENADRYL) injection 25 mg  25 mg Intravenous On Call Natasja Niday S Milany Geck, MD      . famotidine (PEPCID) IVPB 20 mg  20 mg Intravenous On Call Verdis Bassette S Carron Jaggi, MD      . methylPREDNISolone sodium succinate (SOLU-MEDROL) 125 mg/2 mL injection 125 mg  125 mg Intravenous On Call Cornellius Kropp S Riniyah Speich, MD      . sodium chloride 0.9 % injection 3 mL  3 mL Intravenous PRN Shawnee Gambone S Dayle Sherpa, MD       REVIEW OF SYSTEMS: [X ] denotes positive finding; [  ] denotes negative  finding CARDIOVASCULAR:  [ ] chest pain   [ ] chest pressure   [ ] palpitations   [ ] orthopnea   [ ] dyspnea on exertion   [ ] claudication   [ ] rest pain   [ ] DVT   [ ] phlebitis PULMONARY:   [ ] productive cough   [ ] asthma   [ ] wheezing NEUROLOGIC:   [ ] weakness  [ ] paresthesias  [ ] aphasia  [ ] amaurosis  [ ] dizziness HEMATOLOGIC:   [ ] bleeding problems   [ ] clotting disorders MUSCULOSKELETAL:  [ ] joint pain   [ ] joint swelling [ ] leg swelling GASTROINTESTINAL: [ ]  blood in stool  [ ]  hematemesis GENITOURINARY:  [ ]  dysuria  [ ]  hematuria PSYCHIATRIC:  [ ] history of major depression INTEGUMENTARY:  [ ] rashes  [ ] ulcers CONSTITUTIONAL:  [ ] fever   [ ] chills  PHYSICAL EXAM: Filed Vitals:   05/12/13 0910  Pulse: 63  Temp: 97.6 F (36.4 C)  TempSrc: Oral  Resp: 18  Height: 5' 8" (1.727 m)  Weight: 218 lb (98.884 kg)  SpO2: 95%   Body mass index is 33.15 kg/(m^2). GENERAL: The patient   is a well-nourished male, in no acute distress. The vital signs are documented above. CARDIOVASCULAR: There is a regular rate and rhythm.  PULMONARY: There is good air exchange bilaterally without wheezing or rales. 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 The thrill was somewhat weak. SKIN: There are no ulcers or rashes noted. PSYCHIATRIC: The patient has a normal affect.  DATA:  Lab Results  Component Value Date   WBC 8.2 01/04/2012   HGB 13.9 05/12/2013   HCT 41.0 05/12/2013   MCV 97.2 01/04/2012   PLT 191 01/04/2012   Lab Results  Component Value Date   NA 138 05/12/2013   K 4.3 05/12/2013   CL 101 05/12/2013   CO2 22 01/04/2012   Lab Results  Component Value Date   CREATININE 5.20* 05/12/2013   Lab Results  Component Value Date   INR 1.07 12/02/2011   MEDICAL ISSUES: Plan fistulogram. Further recommendations pending these results.   Kelliann Pendergraph S [facility] [place] [facility]: [phone]   

## 2013-05-23 NOTE — Anesthesia Procedure Notes (Signed)
Procedure Name: LMA Insertion Date/Time: 05/23/2013 3:21 PM Performed by: Lebaron Bautch, Nuala Alpha Pre-anesthesia Checklist: Patient identified, Emergency Drugs available, Suction available, Patient being monitored and Timeout performed Patient Re-evaluated:Patient Re-evaluated prior to inductionOxygen Delivery Method: Circle system utilized Preoxygenation: Pre-oxygenation with 100% oxygen Intubation Type: IV induction Ventilation: Mask ventilation without difficulty LMA: LMA inserted LMA Size: 5.0 Number of attempts: 1 Placement Confirmation: positive ETCO2,  CO2 detector and breath sounds checked- equal and bilateral Tube secured with: Tape Dental Injury: Teeth and Oropharynx as per pre-operative assessment

## 2013-05-23 NOTE — Anesthesia Postprocedure Evaluation (Signed)
Anesthesia Post Note  Patient: [name] [name]  Procedure(s) Performed: Procedure(s) (LRB): REVISON OF ARTERIOVENOUS FISTULA (Left)  Anesthesia type: General  Patient location: PACU  Post pain: Pain level controlled and Adequate analgesia  Post assessment: Post-op Vital signs reviewed, Patient's Cardiovascular Status Stable, Respiratory Function Stable, Patent Airway and Pain level controlled  Last Vitals:  Filed Vitals:   05/23/13 1703  BP:   Pulse:   Temp: 36.1 C  Resp:     Post vital signs: Reviewed and stable  Level of consciousness: awake, alert  and oriented  Complications: No apparent anesthesia complications

## 2013-05-23 NOTE — Op Note (Signed)
   NAME: [name] [name]    MRN: [id number] DOB: 10-Feb-1935    DATE OF OPERATION: 05/23/2013  PREOP DIAGNOSIS: poorly functioning left radiocephalic AV fistula  POSTOP DIAGNOSIS: same  PROCEDURE: revision of left radiocephalic AV fistula  SURGEON: Di Kindle. Edilia Bo, MD, FACS  ASSIST: Darlin Coco PA  ANESTHESIA: Gen.   EBL: minimal  INDICATIONS: [name] [name] is a 78 y.o. male who had a fistula that was not functioning adequately. He underwent a fistulogram which showed some irregularity of the proximal anastomosis and also a large competing branch. He is brought in for revision.  FINDINGS: there was intimal hyperplasia at the arterial anastomosis which was revised.  TECHNIQUE: The patient was taken to the operating room and received a general anesthetic. The left upper extremity was prepped and draped in the usual sterile fashion. An incision was made over the proximal fistula with fistula at this level was dissected free. In addition the radial artery proximal and distal to the anastomosis was controlled. The patient was then heparinized. A venotomy was made at the arterial anastomosis and the anastomosis was taken down. There was intimal hyperplasia. I extended the arteriotomy proximally slightly. The area of intimal hyperplasia was excised. A segment of the vein was excised and then sewn end to side to the artery using continuous 6-0 Prolene suture. It was improved thrill in the fistula at this time. Next a small incision was made over the competing branch which was ligated with 2-0 silk tie. This incision was closed with a 4-0 Vicryl. Hemostasis was obtained in the incision at the wrist and then the wounds closed the deep layer 3-0 Vicryl and the skin closed with 4-0 Vicryl. Dermabond was applied. The patient tolerated the procedure well and was transferred to the recovery room in stable condition. All needle and sponge counts were correct.  Waverly Ferrari, MD, FACS [facility]  DATE OF DICTATION:   05/23/2013

## 2013-05-23 NOTE — Preoperative (Signed)
Beta Blockers   Reason not to administer Beta Blockers:Not Applicable 

## 2013-05-23 NOTE — Anesthesia Preprocedure Evaluation (Signed)
Anesthesia Evaluation  Patient identified by MRN, date of birth, ID band Patient awake    Reviewed: Allergy & Precautions, H&P , NPO status , Patient's Chart, lab work & pertinent test results  Airway Mallampati: II TM Distance: >3 FB Neck ROM: Full    Dental   Pulmonary shortness of breath, former smoker,  + rhonchi         Cardiovascular hypertension, + Valvular Problems/Murmurs MR Rhythm:Regular Rate:Normal     Neuro/Psych  Headaches, Depression    GI/Hepatic   Endo/Other  diabetes  Renal/GU ESRFRenal disease     Musculoskeletal   Abdominal (+) + obese,   Peds  Hematology  (+) anemia ,   Anesthesia Other Findings   Reproductive/Obstetrics                           Anesthesia Physical Anesthesia Plan  ASA: III  Anesthesia Plan: General   Post-op Pain Management:    Induction: Intravenous  Airway Management Planned: LMA  Additional Equipment:   Intra-op Plan:   Post-operative Plan: Extubation in OR  Informed Consent: I have reviewed the patients History and Physical, chart, labs and discussed the procedure including the risks, benefits and alternatives for the proposed anesthesia with the patient or authorized representative who has indicated his/her understanding and acceptance.     Plan Discussed with: CRNA and Surgeon  Anesthesia Plan Comments:         Anesthesia Quick Evaluation

## 2013-05-27 ENCOUNTER — Encounter (HOSPITAL_COMMUNITY): Payer: Self-pay | Admitting: Vascular Surgery

## 2013-06-02 ENCOUNTER — Ambulatory Visit (INDEPENDENT_AMBULATORY_CARE_PROVIDER_SITE_OTHER): Payer: Medicare PPO | Admitting: Cardiovascular Disease

## 2013-06-02 ENCOUNTER — Encounter: Payer: Self-pay | Admitting: Cardiovascular Disease

## 2013-06-02 VITALS — BP 163/86 | HR 62 | Ht 68.0 in | Wt 216.0 lb

## 2013-06-02 DIAGNOSIS — N186 End stage renal disease: Secondary | ICD-10-CM

## 2013-06-02 DIAGNOSIS — Z136 Encounter for screening for cardiovascular disorders: Secondary | ICD-10-CM

## 2013-06-02 DIAGNOSIS — M7989 Other specified soft tissue disorders: Secondary | ICD-10-CM

## 2013-06-02 DIAGNOSIS — I340 Nonrheumatic mitral (valve) insufficiency: Secondary | ICD-10-CM

## 2013-06-02 DIAGNOSIS — R9439 Abnormal result of other cardiovascular function study: Secondary | ICD-10-CM

## 2013-06-02 DIAGNOSIS — I498 Other specified cardiac arrhythmias: Secondary | ICD-10-CM

## 2013-06-02 DIAGNOSIS — I10 Essential (primary) hypertension: Secondary | ICD-10-CM

## 2013-06-02 DIAGNOSIS — R0609 Other forms of dyspnea: Secondary | ICD-10-CM

## 2013-06-02 DIAGNOSIS — I059 Rheumatic mitral valve disease, unspecified: Secondary | ICD-10-CM

## 2013-06-02 DIAGNOSIS — R001 Bradycardia, unspecified: Secondary | ICD-10-CM

## 2013-06-02 MED ORDER — ISOSORBIDE MONONITRATE ER 60 MG PO TB24: 60.0000 mg | ORAL_TABLET | Freq: Every day | ORAL | Status: DC

## 2013-06-02 MED ORDER — AMLODIPINE BESYLATE 5 MG PO TABS: 5.0000 mg | ORAL_TABLET | Freq: Every day | ORAL | Status: AC

## 2013-06-02 NOTE — Progress Notes (Signed)
Patient ID: [name] [name], male   DOB: [birth year], 78 y.o.   MRN: [id number]      SUBJECTIVE: [name] [name] is here for f/u of dyspnea. His stress test was deemed to be low risk, revealing a small, mild, apical lateral defect with at most a mild degree of ischemia. The test was complicated by both motion artifact and frequent PVC's.  He has ESRD and is on dialysis. He also has HTN and diabetes.  A recent monitor showed normal sinus rhythm with frequent PVC's. A recent echocardiogram (02-26-13) showed the following:  - Left ventricle: Diastolic dysfunction noted. Mild posterior wall and moderate septal thickening. Systolic function was normal. The estimated ejection fraction was in the range of 55% to 60%. Doppler parameters are consistent with elevated ventricular end-diastolic filling pressure. - Aortic valve: Mild regurgitation. - Mitral valve: Calcified annulus. Mildly thickened leaflets . Moderate to severe regurgitation. - Left atrium: The atrium was mild to moderately dilated. - Right ventricle: Mildly reduced systolic function. The cavity size was mildly dilated. - Right atrium: The atrium was moderately dilated. - Tricuspid valve: Mild to moderate regurgitation.   He had been having dyspnea on exertion in 01/2013, and thus underwent the aforementioned stress test as well as PFT's.  The PFT's showed a minimal obstructive defect, mild restrictive defect, and moderate decrease in diffusion capacity.  He used to walk up to a mile daily. His dyspnea has been occurring over the last 5-6 months.  He has several pins and 2 plates in his left ankle which limits his ability to walk, but has been walking a quarter of a mile and if he doesn't over-exert himself, he does alright.  He's never had an MI. His BP has been labile and has recent adjustments in his metoprolol. He now takes 50 mg daily but had been on 100 mg daily.  After the addition of Imdur at the last visit, he did well with respect  to dyspnea for some time, and then it recurred over the past week. He's also had right leg swelling (moreso than left). He denies chest pain and syncope. He did not eat much on Thanksgiving, and did dialyze on that day.      Allergies  Allergen Reactions  . Ivp Dye [Iodinated Diagnostic Agents]     Unknown  . Ofloxacin Nausea And Vomiting  . Oxycodone     Altered mental status  . Sulfa Antibiotics Rash  . Sulfamethoxazole-Trimethoprim Rash    Current Outpatient Prescriptions  Medication Sig Dispense Refill  . acetaminophen (TYLENOL) 500 MG tablet Take 500 mg by mouth every 6 (six) hours as needed.      Marland Kitchen allopurinol (ZYLOPRIM) 100 MG tablet Take 100 mg by mouth 2 (two) times daily.       . B Complex-C-Zn-Folic Acid (DIALYVITE/ZINC) TABS Take 1 tablet by mouth Daily.      . calcium acetate (PHOSLO) 667 MG capsule Take 667-1,334 mg by mouth See admin instructions. Takes 2 with meals, takes 1 with snacks      . docusate sodium (COLACE) 100 MG capsule Take 100 mg by mouth daily as needed for constipation.      . gabapentin (NEURONTIN) 300 MG capsule Take 300-900 mg by mouth at bedtime as needed (pain).       . isosorbide mononitrate (IMDUR) 30 MG 24 hr tablet Take 1 tablet (30 mg total) by mouth daily.  30 tablet  6  . lansoprazole (PREVACID) 15 MG capsule Take 15 mg by  mouth daily.       Marland Kitchen LORazepam (ATIVAN) 0.5 MG tablet Take 0.5 mg by mouth every 8 (eight) hours as needed for anxiety.       . metoprolol succinate (TOPROL-XL) 100 MG 24 hr tablet Take 1/2 tablet daily. Take with or immediately following a meal.  15 tablet  6  . PARoxetine (PAXIL) 30 MG tablet Take 30 mg by mouth every morning.      . sevelamer carbonate (RENVELA) 800 MG tablet Take 800-1,600 mg by mouth See admin instructions. Takes 2 tablets with each meal and 1 with snacks twice a day.      . triamcinolone cream (KENALOG) 0.1 % Apply 1 application topically 2 (two) times daily as needed (rash).        No current  facility-administered medications for this visit.    Past Medical History  Diagnosis Date  . ESRD on dialysis     Left fistula   . Ankle fracture     Left ~08/2011. [place] fracture  . DM (diabetes mellitus)     Per pt, borderline   . Renal cell cancer     Remote. S/p right nephrectomy.   . Melanoma   . HTN (hypertension)   . Neuropathy   . Mitral regurgitation     Moderate/severe, echo, 01/2013  . Leaky heart valve     Hx: of  . Depression   . Arthritis   . Headache(784.0)   . Rash     over entire body    Past Surgical History  Procedure Laterality Date  . External fixation leg  12/02/2011    Procedure: EXTERNAL FIXATION LEG;  Surgeon: Velna Ochs, MD;  Location: [facility] OR;  Service: Orthopedics;  Laterality: Left;  . I&d extremity  12/02/2011    Procedure: IRRIGATION AND DEBRIDEMENT EXTREMITY;  Surgeon: Velna Ochs, MD;  Location: [facility] OR;  Service: Orthopedics;  Laterality: Left;  . Hardware removal  12/02/2011    Procedure: HARDWARE REMOVAL;  Surgeon: Velna Ochs, MD;  Location: [facility] OR;  Service: Orthopedics;  Laterality: Left;  . Orif ankle fracture  12/07/2011    Procedure: OPEN REDUCTION INTERNAL FIXATION (ORIF) ANKLE FRACTURE;  Surgeon: Budd Palmer, MD;  Location: [facility] OR;  Service: Orthopedics;  Laterality: Left;  ORIF left pilon  . Colonoscopy w/ biopsies and polypectomy      Hx: of  . Hernia repair    . Revison of arteriovenous fistula Left 05/23/2013    Procedure: REVISON OF ARTERIOVENOUS FISTULA;  Surgeon: Chuck Hint, MD;  [facility];  Service: Vascular;  Laterality: Left;    History   Social History  . Marital Status: Married    Spouse Name: N/A    Number of Children: 2  . Years of Education: N/A   Occupational History  . Retired Pharmacologist.    Social History Main Topics  . Smoking status: Former Smoker -- 0.80 packs/day for 10 years    Types: Cigarettes    Quit date: 07/03/1970  . Smokeless tobacco: Never Used  . Alcohol  Use: No  . Drug Use: No  . Sexual Activity: Not on file   Other Topics Concern  . Not on file   Social History Narrative   Lives at home with wife and grandson.     Filed Vitals:   06/02/13 1353  BP: 163/86  Pulse: 62  Height: 5\' 8"  (1.727 m)  Weight: 216 lb (97.977 kg)    PHYSICAL EXAM General:  NAD  Neck: No JVD, no thyromegaly or thyroid nodule.  Lungs: Clear to auscultation bilaterally with normal respiratory effort.  CV: Nondisplaced PMI. Heart regular S1/S2, +S3, no S4, I/VI holosystolic murmur at base. 1-2+ right leg and 1+ left leg peripheral edema. No carotid bruit. Normal pedal pulses.  Abdomen: Soft, nontender, no hepatosplenomegaly, no distention.  Neurologic: Alert and oriented x 3.  Psych: Normal affect.  Extremities: No clubbing or cyanosis.   ECG: reviewed and available in electronic records.      ASSESSMENT AND PLAN: Exertional dyspnea  PFTs were unremarkable, and stress test may have showed a small, mild area of ischemia, but was a low-risk test. It may be in part due to his mitral valve disease, but the small possible area of ischemia could also be contributing. He did temporarily experience some relief with Imdur 30 mg daily. I will increase Imdur to 60 mg daily. If his symptoms were to progress, I would consider increasing the Imdur vs cardiac catheterization.   Mitral regurgitation  Results of recent echocardiogram were reviewed. Will continue to monitor this closely. The etiology of his dyspnea on exertion may be due to this (moderate to severe MR).   Sinus bradycardia  Significantly improved following recent decrease in beta blocker dose. Will continue low-dose therapy with Toprol XL 25 mg daily, particularly in light of noted frequent PVCs on Holter monitoring.   ESRD on dialysis  Patient remains compliant with his weekly schedule (T/Th/Sat).   HTN  He had reportedly been experiencing labile BP's with hypotension most recently. However, it  remains uncontrolled today. I will start amlodipine 5 mg daily. Given his elevated LVEDP by echocardiography, I would want to make sure that this is treated optimally, as this (along with his mitral regurgitation) can be contributing to his exertional dyspnea.  Leg swelling May be due to diastolic heart failure given his elevated BP and LVEDP by echo. I will control BP with Norvasc 5 mg daily and prescribe knee-high compression stockings 20-30 mmHg. He is scheduled to dialyze tomorrow and I've asked him to inform his providers at dialysis about the increased leg swelling, so that more fluid can be removed. He is very oliguric.  Dispo: f/u 1 month.    Prentice Docker, M.D., F.A.C.C.

## 2013-06-02 NOTE — Patient Instructions (Signed)
   Increase Imdur to 60mg  daily - new sent to pharm  Begin Amlodipine 5mg  daily - new sent to pharm Continue all other current medications. Knee high compression stockings - script given today Follow up in  1 month

## 2013-07-09 ENCOUNTER — Ambulatory Visit: Payer: Medicare PPO | Admitting: Cardiovascular Disease

## 2013-09-09 ENCOUNTER — Encounter (HOSPITAL_COMMUNITY): Payer: Medicare PPO

## 2013-09-11 ENCOUNTER — Ambulatory Visit: Admit: 2013-09-11 | Payer: Medicare PPO | Admitting: Urology

## 2013-09-11 SURGERY — TURP (TRANSURETHRAL RESECTION OF PROSTATE)
Anesthesia: Choice

## 2013-10-20 ENCOUNTER — Telehealth: Payer: Self-pay | Admitting: Cardiovascular Disease

## 2013-10-20 NOTE — Telephone Encounter (Signed)
Spoke with son. [name] [name] is currently in [facility]

## 2014-06-11 ENCOUNTER — Encounter (HOSPITAL_COMMUNITY): Payer: Self-pay | Admitting: Vascular Surgery

## 2015-05-14 ENCOUNTER — Other Ambulatory Visit: Payer: Self-pay

## 2015-05-31 ENCOUNTER — Encounter (HOSPITAL_COMMUNITY): Payer: Self-pay | Admitting: Vascular Surgery

## 2015-05-31 ENCOUNTER — Ambulatory Visit (HOSPITAL_COMMUNITY)
Admission: RE | Admit: 2015-05-31 | Discharge: 2015-05-31 | Disposition: A | Discharge status: Home / Self Care | Payer: Medicare PPO | Source: Ambulatory Visit | Attending: Vascular Surgery | Admitting: Vascular Surgery

## 2015-05-31 ENCOUNTER — Encounter (HOSPITAL_COMMUNITY)
Admission: RE | Disposition: A | Discharge status: Home / Self Care | Payer: Self-pay | Source: Ambulatory Visit | Attending: Vascular Surgery

## 2015-05-31 DIAGNOSIS — Z882 Allergy status to sulfonamides status: Secondary | ICD-10-CM | POA: Insufficient documentation

## 2015-05-31 DIAGNOSIS — Y832 Surgical operation with anastomosis, bypass or graft as the cause of abnormal reaction of the patient, or of later complication, without mention of misadventure at the time of the procedure: Secondary | ICD-10-CM | POA: Insufficient documentation

## 2015-05-31 DIAGNOSIS — I120 Hypertensive chronic kidney disease with stage 5 chronic kidney disease or end stage renal disease: Secondary | ICD-10-CM | POA: Diagnosis not present

## 2015-05-31 DIAGNOSIS — E1122 Type 2 diabetes mellitus with diabetic chronic kidney disease: Secondary | ICD-10-CM | POA: Insufficient documentation

## 2015-05-31 DIAGNOSIS — I340 Nonrheumatic mitral (valve) insufficiency: Secondary | ICD-10-CM | POA: Insufficient documentation

## 2015-05-31 DIAGNOSIS — Z905 Acquired absence of kidney: Secondary | ICD-10-CM | POA: Insufficient documentation

## 2015-05-31 DIAGNOSIS — T82898A Other specified complication of vascular prosthetic devices, implants and grafts, initial encounter: Secondary | ICD-10-CM

## 2015-05-31 DIAGNOSIS — Z85528 Personal history of other malignant neoplasm of kidney: Secondary | ICD-10-CM | POA: Insufficient documentation

## 2015-05-31 DIAGNOSIS — Z992 Dependence on renal dialysis: Secondary | ICD-10-CM | POA: Diagnosis not present

## 2015-05-31 DIAGNOSIS — F329 Major depressive disorder, single episode, unspecified: Secondary | ICD-10-CM | POA: Diagnosis not present

## 2015-05-31 DIAGNOSIS — Z91041 Radiographic dye allergy status: Secondary | ICD-10-CM | POA: Diagnosis not present

## 2015-05-31 DIAGNOSIS — N186 End stage renal disease: Secondary | ICD-10-CM | POA: Diagnosis not present

## 2015-05-31 DIAGNOSIS — Z87891 Personal history of nicotine dependence: Secondary | ICD-10-CM | POA: Insufficient documentation

## 2015-05-31 DIAGNOSIS — M1990 Unspecified osteoarthritis, unspecified site: Secondary | ICD-10-CM | POA: Insufficient documentation

## 2015-05-31 DIAGNOSIS — E1140 Type 2 diabetes mellitus with diabetic neuropathy, unspecified: Secondary | ICD-10-CM | POA: Diagnosis not present

## 2015-05-31 DIAGNOSIS — Z85820 Personal history of malignant melanoma of skin: Secondary | ICD-10-CM | POA: Diagnosis not present

## 2015-05-31 HISTORY — PX: PERIPHERAL VASCULAR CATHETERIZATION: SHX172C

## 2015-05-31 LAB — POCT I-STAT, CHEM 8
BUN: 37 mg/dL — AB (ref 6–20)
CHLORIDE: 98 mmol/L — AB (ref 101–111)
CREATININE: 8.4 mg/dL — AB (ref 0.61–1.24)
Calcium, Ion: 0.83 mmol/L — ABNORMAL LOW (ref 1.13–1.30)
Glucose, Bld: 91 mg/dL (ref 65–99)
HEMATOCRIT: 40 % (ref 39.0–52.0)
Hemoglobin: 13.6 g/dL (ref 13.0–17.0)
POTASSIUM: 4.3 mmol/L (ref 3.5–5.1)
SODIUM: 138 mmol/L (ref 135–145)
TCO2: 25 mmol/L (ref 0–100)

## 2015-05-31 SURGERY — A/V SHUNTOGRAM/FISTULAGRAM
Anesthesia: LOCAL | Laterality: Left

## 2015-05-31 MED ORDER — HEPARIN (PORCINE) IN NACL 2-0.9 UNIT/ML-% IJ SOLN
INTRAMUSCULAR | Status: AC
  Filled 2015-05-31: qty 500

## 2015-05-31 MED ORDER — SODIUM CHLORIDE 0.9 % IJ SOLN: 3.0000 mL | INTRAMUSCULAR | Status: DC | PRN

## 2015-05-31 MED ORDER — FAMOTIDINE IN NACL 20-0.9 MG/50ML-% IV SOLN
20.0000 mg | INTRAVENOUS | Status: AC
  Administered 2015-05-31: 20 mg via INTRAVENOUS

## 2015-05-31 MED ORDER — METHYLPREDNISOLONE SODIUM SUCC 125 MG IJ SOLR
125.0000 mg | INTRAMUSCULAR | Status: AC
  Administered 2015-05-31: 125 mg via INTRAVENOUS

## 2015-05-31 MED ORDER — METHYLPREDNISOLONE SODIUM SUCC 125 MG IJ SOLR
INTRAMUSCULAR | Status: AC
  Administered 2015-05-31: 125 mg via INTRAVENOUS
  Filled 2015-05-31: qty 2

## 2015-05-31 MED ORDER — ACETAMINOPHEN 325 MG PO TABS: 650.0000 mg | ORAL_TABLET | ORAL | Status: DC | PRN

## 2015-05-31 MED ORDER — HEPARIN (PORCINE) IN NACL 2-0.9 UNIT/ML-% IJ SOLN
INTRAMUSCULAR | Status: DC | PRN
  Administered 2015-05-31: 3 mL

## 2015-05-31 MED ORDER — IODIXANOL 320 MG/ML IV SOLN
INTRAVENOUS | Status: DC | PRN
  Administered 2015-05-31: 48 mL via INTRAVENOUS

## 2015-05-31 MED ORDER — LIDOCAINE HCL (PF) 1 % IJ SOLN
INTRAMUSCULAR | Status: AC
  Filled 2015-05-31: qty 30

## 2015-05-31 MED ORDER — FAMOTIDINE IN NACL 20-0.9 MG/50ML-% IV SOLN
INTRAVENOUS | Status: AC
  Administered 2015-05-31: 20 mg via INTRAVENOUS
  Filled 2015-05-31: qty 50

## 2015-05-31 MED ORDER — DIPHENHYDRAMINE HCL 50 MG/ML IJ SOLN
25.0000 mg | INTRAMUSCULAR | Status: AC
  Administered 2015-05-31: 25 mg via INTRAVENOUS

## 2015-05-31 MED ORDER — DIPHENHYDRAMINE HCL 50 MG/ML IJ SOLN
INTRAMUSCULAR | Status: AC
  Administered 2015-05-31: 25 mg via INTRAVENOUS
  Filled 2015-05-31: qty 1

## 2015-05-31 SURGICAL SUPPLY — 10 items
BAG SNAP BAND KOVER 36X36 (MISCELLANEOUS) ×2 IMPLANT
COVER DOME SNAP 22 D (MISCELLANEOUS) ×2 IMPLANT
COVER PRB 48X5XTLSCP FOLD TPE (BAG) ×1 IMPLANT
COVER PROBE 5X48 (BAG) ×1
KIT MICROINTRODUCER STIFF 5F (SHEATH) ×2 IMPLANT
PROTECTION STATION PRESSURIZED (MISCELLANEOUS) ×2
STATION PROTECTION PRESSURIZED (MISCELLANEOUS) ×1 IMPLANT
STOPCOCK MORSE 400PSI 3WAY (MISCELLANEOUS) ×2 IMPLANT
TRAY PV CATH (CUSTOM PROCEDURE TRAY) ×2 IMPLANT
TUBING CIL FLEX 10 FLL-RA (TUBING) ×2 IMPLANT

## 2015-05-31 NOTE — Discharge Instructions (Signed)
Fistulogram, Care After °Refer to this sheet in the next few weeks. These instructions provide you with information on caring for yourself after your procedure. Your health care provider may also give you more specific instructions. Your treatment has been planned according to current medical practices, but problems sometimes occur. Call your health care provider if you have any problems or questions after your procedure. °WHAT TO EXPECT AFTER THE PROCEDURE °After your procedure, it is typical to have the following: °· A small amount of discomfort in the area where the catheters were placed. °· A small amount of bruising around the fistula. °· Sleepiness and fatigue. °HOME CARE INSTRUCTIONS °· Rest at home for the day following your procedure. °· Do not drive or operate heavy machinery while taking pain medicine. °· Take medicines only as directed by your health care provider. °· Do not take baths, swim, or use a hot tub until your health care provider approves. You may shower 24 hours after the procedure or as directed by your health care provider. °· There are many different ways to close and cover an incision, including stitches, skin glue, and adhesive strips. Follow your health care provider's instructions on: °¨ Incision care. °¨ Bandage (dressing) changes and removal. °¨ Incision closure removal. °· Monitor your dialysis fistula carefully. °SEEK MEDICAL CARE IF: °· You have drainage, redness, swelling, or pain at your catheter site. °· You have a fever. °· You have chills. °SEEK IMMEDIATE MEDICAL CARE IF: °· You feel weak. °· You have trouble balancing. °· You have trouble moving your arms or legs. °· You have problems with your speech or vision. °· You can no longer feel a vibration or buzz when you put your fingers over your dialysis fistula. °· The limb that was used for the procedure: °¨ Swells. °¨ Is painful. °¨ Is cold. °¨ Is discolored, such as blue or pale white. °  °This information is not intended  to replace advice given to you by your health care provider. Make sure you discuss any questions you have with your health care provider. °  °Document Released: 11/03/2013 Document Reviewed: 11/03/2013 °Elsevier Interactive Patient Education ©2016 Elsevier Inc. ° °

## 2015-05-31 NOTE — H&P (Signed)
Brief History and Physical  History of Present Illness  [name] [name] is a 80 y.o. male who presents with chief complaint: poor flow rates in L RC AVF.  The patient presents today for L arm fistulogram, possible interventions.  This patient denies any bleeding complications from the L RC AVF.  He notes no thrombus during his HD runs but notes over the last few weeks the flow rates have been deteriorating.     Past Medical History  Diagnosis Date  . ESRD on dialysis     Left fistula   . Ankle fracture     Left ~08/2011. [place] fracture  . DM (diabetes mellitus)     Per pt, borderline   . Renal cell cancer     Remote. S/p right nephrectomy.   . Melanoma   . HTN (hypertension)   . Neuropathy   . Mitral regurgitation     Moderate/severe, echo, 01/2013  . Leaky heart valve     Hx: of  . Depression   . Arthritis   . Headache(784.0)   . Rash     over entire body    Past Surgical History  Procedure Laterality Date  . External fixation leg  12/02/2011    Procedure: EXTERNAL FIXATION LEG;  Surgeon: Hessie Dibble, MD;  Location: [place];  Service: Orthopedics;  Laterality: Left;  . I&d extremity  12/02/2011    Procedure: IRRIGATION AND DEBRIDEMENT EXTREMITY;  Surgeon: Hessie Dibble, MD;  Location: [place];  Service: Orthopedics;  Laterality: Left;  . Hardware removal  12/02/2011    Procedure: HARDWARE REMOVAL;  Surgeon: Hessie Dibble, MD;  Location: [place];  Service: Orthopedics;  Laterality: Left;  . Orif ankle fracture  12/07/2011    Procedure: OPEN REDUCTION INTERNAL FIXATION (ORIF) ANKLE FRACTURE;  Surgeon: Rozanna Box, MD;  Location: [place];  Service: Orthopedics;  Laterality: Left;  ORIF left pilon  . Colonoscopy w/ biopsies and polypectomy      Hx: of  . Hernia repair    . Revison of arteriovenous fistula Left 05/23/2013    Procedure: REVISON OF ARTERIOVENOUS FISTULA;  Surgeon: Angelia Mould, MD;  Location: [place];  Service: Vascular;  Laterality: Left;  .  Fistulogram N/A 05/12/2013    Procedure: FISTULOGRAM;  Surgeon: Angelia Mould, MD;  Location: [facility] CATH LAB;  Service: Cardiovascular;  Laterality: N/A;    Social History   Social History  . Marital Status: Married    Spouse Name: N/A  . Number of Children: 2  . Years of Education: N/A   Occupational History  . Retired Public house manager.    Social History Main Topics  . Smoking status: Former Smoker -- 0.80 packs/day for 10 years    Types: Cigarettes    Quit date: 07/03/1970  . Smokeless tobacco: Never Used  . Alcohol Use: No  . Drug Use: No  . Sexual Activity: Not on file   Other Topics Concern  . Not on file   Social History Narrative   Lives at home with wife and grandson.    Family History  Problem Relation Age of Onset  . Pancreatic cancer Father   . Dementia Mother     No current facility-administered medications on file prior to encounter.   Current Outpatient Prescriptions on File Prior to Encounter  Medication Sig Dispense Refill  . acetaminophen (TYLENOL) 500 MG tablet Take 500 mg by mouth every 6 (six) hours as needed for moderate pain.     Marland Kitchen  allopurinol (ZYLOPRIM) 100 MG tablet Take 200 mg by mouth daily.     Marland Kitchen docusate sodium (COLACE) 100 MG capsule Take 100 mg by mouth daily as needed for constipation.    . gabapentin (NEURONTIN) 300 MG capsule Take 300-900 mg by mouth at bedtime as needed (pain).     Marland Kitchen PARoxetine (PAXIL) 30 MG tablet Take 30 mg by mouth every morning.    Marland Kitchen amLODipine (NORVASC) 5 MG tablet Take 1 tablet (5 mg total) by mouth daily. (Patient not taking: Reported on 05/26/2015) 30 tablet 6  . isosorbide mononitrate (IMDUR) 60 MG 24 hr tablet Take 1 tablet (60 mg total) by mouth daily. (Patient not taking: Reported on 05/26/2015) 30 tablet 6  . metoprolol succinate (TOPROL-XL) 100 MG 24 hr tablet Take 1/2 tablet daily. Take with or immediately following a meal. (Patient not taking: Reported on 05/26/2015) 15 tablet 6    Allergies    Allergen Reactions  . Ivp Dye [Iodinated Diagnostic Agents]     Unknown  . Ofloxacin Nausea And Vomiting  . Oxycodone     Altered mental status  . Sulfa Antibiotics Rash  . Sulfamethoxazole-Trimethoprim Rash    Review of Systems: As listed above, otherwise negative.  Physical Examination  Filed Vitals:   05/31/15 0948  BP: 159/85  Pulse: 60  Temp: 97.7 F (36.5 C)  TempSrc: Oral  Resp: 16  Height: 5\' 8"  (1.727 m)  Weight: 215 lb (97.523 kg)  SpO2: 97%    General: A&O x 3, WDWN  Pulmonary: Sym exp, good air movt, CTAB, no rales, rhonchi, & wheezing  Cardiac: RRR, Nl S1, S2, no Murmurs, rubs or gallops  Gastrointestinal: soft, NTND, -G/R, - HSM, - masses, - CVAT B  Musculoskeletal: M/S 5/5 throughout , Extremities without ischemic changes, moderate sized PSA x 2 in L RC AVF, faint thrill, +bruit  Laboratory See iStat  Medical Decision Making  [name] [name] is a 80 y.o. male who presents with: left RC AVF with poor flow rates.   When I partial compress the PSA, the thrill improved, suggesting some of the flow rate issues are related to the eddy currents in the moderate size PSA.  The bradycardia with cardiac arrhythmia is also contributing.  The patient is scheduled for: L arm fistulogram, possible intervention. I discussed with the patient the nature of angiographic procedures, especially the limited patencies of any endovascular intervention.  The patient is aware of that the risks of an angiographic procedure include but are not limited to: bleeding, infection, access site complications, renal failure, embolization, rupture of vessel, dissection, possible need for emergent surgical intervention, possible need for surgical procedures to treat the patient's pathology, and stroke and death.    The patient is aware of the risks and agrees to proceed.  [name], MD [facility] Office: [phone] Pager:  [phone]  05/31/2015, 10:45 AM

## 2018-04-04 ENCOUNTER — Inpatient Hospital Stay (HOSPITAL_COMMUNITY)
Admission: EM | Admit: 2018-04-04 | Discharge: 2018-04-09 | DRG: 480 | Disposition: A | Discharge status: Skilled Nursing Facility | Payer: Medicare PPO | Source: Skilled Nursing Facility | Attending: Internal Medicine | Admitting: Internal Medicine

## 2018-04-04 ENCOUNTER — Inpatient Hospital Stay (HOSPITAL_COMMUNITY): Payer: Medicare PPO | Admitting: Anesthesiology

## 2018-04-04 ENCOUNTER — Inpatient Hospital Stay (HOSPITAL_COMMUNITY): Payer: Medicare PPO

## 2018-04-04 ENCOUNTER — Encounter (HOSPITAL_COMMUNITY): Payer: Self-pay | Admitting: *Deleted

## 2018-04-04 ENCOUNTER — Encounter (HOSPITAL_COMMUNITY)
Admission: EM | Disposition: A | Discharge status: Skilled Nursing Facility | Payer: Self-pay | Source: Skilled Nursing Facility | Attending: Internal Medicine

## 2018-04-04 ENCOUNTER — Other Ambulatory Visit: Payer: Self-pay

## 2018-04-04 ENCOUNTER — Emergency Department (HOSPITAL_COMMUNITY): Payer: Medicare PPO

## 2018-04-04 DIAGNOSIS — Z9359 Other cystostomy status: Secondary | ICD-10-CM

## 2018-04-04 DIAGNOSIS — I340 Nonrheumatic mitral (valve) insufficiency: Secondary | ICD-10-CM | POA: Diagnosis not present

## 2018-04-04 DIAGNOSIS — S72001A Fracture of unspecified part of neck of right femur, initial encounter for closed fracture: Secondary | ICD-10-CM | POA: Diagnosis not present

## 2018-04-04 DIAGNOSIS — E119 Type 2 diabetes mellitus without complications: Secondary | ICD-10-CM

## 2018-04-04 DIAGNOSIS — Z993 Dependence on wheelchair: Secondary | ICD-10-CM

## 2018-04-04 DIAGNOSIS — N189 Chronic kidney disease, unspecified: Secondary | ICD-10-CM

## 2018-04-04 DIAGNOSIS — R5381 Other malaise: Secondary | ICD-10-CM | POA: Diagnosis not present

## 2018-04-04 DIAGNOSIS — Z79899 Other long term (current) drug therapy: Secondary | ICD-10-CM | POA: Diagnosis not present

## 2018-04-04 DIAGNOSIS — Z85820 Personal history of malignant melanoma of skin: Secondary | ICD-10-CM

## 2018-04-04 DIAGNOSIS — Z885 Allergy status to narcotic agent status: Secondary | ICD-10-CM | POA: Diagnosis not present

## 2018-04-04 DIAGNOSIS — S72009A Fracture of unspecified part of neck of unspecified femur, initial encounter for closed fracture: Secondary | ICD-10-CM | POA: Diagnosis not present

## 2018-04-04 DIAGNOSIS — D631 Anemia in chronic kidney disease: Secondary | ICD-10-CM | POA: Diagnosis present

## 2018-04-04 DIAGNOSIS — I120 Hypertensive chronic kidney disease with stage 5 chronic kidney disease or end stage renal disease: Secondary | ICD-10-CM | POA: Diagnosis present

## 2018-04-04 DIAGNOSIS — Z905 Acquired absence of kidney: Secondary | ICD-10-CM

## 2018-04-04 DIAGNOSIS — N186 End stage renal disease: Secondary | ICD-10-CM | POA: Diagnosis present

## 2018-04-04 DIAGNOSIS — Z91041 Radiographic dye allergy status: Secondary | ICD-10-CM | POA: Diagnosis not present

## 2018-04-04 DIAGNOSIS — Z800 Family history of malignant neoplasm of digestive organs: Secondary | ICD-10-CM | POA: Diagnosis not present

## 2018-04-04 DIAGNOSIS — Z66 Do not resuscitate: Secondary | ICD-10-CM | POA: Diagnosis present

## 2018-04-04 DIAGNOSIS — W050XXA Fall from non-moving wheelchair, initial encounter: Secondary | ICD-10-CM | POA: Diagnosis present

## 2018-04-04 DIAGNOSIS — S72141A Displaced intertrochanteric fracture of right femur, initial encounter for closed fracture: Secondary | ICD-10-CM | POA: Diagnosis present

## 2018-04-04 DIAGNOSIS — F329 Major depressive disorder, single episode, unspecified: Secondary | ICD-10-CM | POA: Diagnosis present

## 2018-04-04 DIAGNOSIS — F0391 Unspecified dementia with behavioral disturbance: Secondary | ICD-10-CM | POA: Diagnosis present

## 2018-04-04 DIAGNOSIS — M1990 Unspecified osteoarthritis, unspecified site: Secondary | ICD-10-CM | POA: Diagnosis present

## 2018-04-04 DIAGNOSIS — E1122 Type 2 diabetes mellitus with diabetic chronic kidney disease: Secondary | ICD-10-CM | POA: Diagnosis present

## 2018-04-04 DIAGNOSIS — R451 Restlessness and agitation: Secondary | ICD-10-CM | POA: Diagnosis not present

## 2018-04-04 DIAGNOSIS — Z992 Dependence on renal dialysis: Secondary | ICD-10-CM | POA: Diagnosis not present

## 2018-04-04 DIAGNOSIS — Z882 Allergy status to sulfonamides status: Secondary | ICD-10-CM

## 2018-04-04 DIAGNOSIS — Z8659 Personal history of other mental and behavioral disorders: Secondary | ICD-10-CM | POA: Diagnosis not present

## 2018-04-04 DIAGNOSIS — Z419 Encounter for procedure for purposes other than remedying health state, unspecified: Secondary | ICD-10-CM

## 2018-04-04 DIAGNOSIS — Z85528 Personal history of other malignant neoplasm of kidney: Secondary | ICD-10-CM

## 2018-04-04 DIAGNOSIS — D62 Acute posthemorrhagic anemia: Secondary | ICD-10-CM | POA: Diagnosis not present

## 2018-04-04 DIAGNOSIS — W19XXXA Unspecified fall, initial encounter: Secondary | ICD-10-CM

## 2018-04-04 DIAGNOSIS — Z87891 Personal history of nicotine dependence: Secondary | ICD-10-CM | POA: Diagnosis not present

## 2018-04-04 DIAGNOSIS — E1140 Type 2 diabetes mellitus with diabetic neuropathy, unspecified: Secondary | ICD-10-CM | POA: Diagnosis present

## 2018-04-04 DIAGNOSIS — S7290XA Unspecified fracture of unspecified femur, initial encounter for closed fracture: Secondary | ICD-10-CM

## 2018-04-04 DIAGNOSIS — Z781 Physical restraint status: Secondary | ICD-10-CM

## 2018-04-04 HISTORY — PX: FEMUR IM NAIL: SHX1597

## 2018-04-04 LAB — BASIC METABOLIC PANEL
Anion gap: 13 (ref 5–15)
BUN: 25 mg/dL — AB (ref 8–23)
CHLORIDE: 97 mmol/L — AB (ref 98–111)
CO2: 28 mmol/L (ref 22–32)
CREATININE: 6.05 mg/dL — AB (ref 0.61–1.24)
Calcium: 8.2 mg/dL — ABNORMAL LOW (ref 8.9–10.3)
GFR calc Af Amer: 9 mL/min — ABNORMAL LOW (ref >60)
GFR calc non Af Amer: 8 mL/min — ABNORMAL LOW (ref >60)
GLUCOSE: 112 mg/dL — AB (ref 70–99)
POTASSIUM: 4.1 mmol/L (ref 3.5–5.1)
Sodium: 138 mmol/L (ref 135–145)

## 2018-04-04 LAB — CBC WITH DIFFERENTIAL/PLATELET
Basophils Absolute: 0.1 10*3/uL (ref 0.0–0.1)
Basophils Relative: 0 %
EOS PCT: 4 %
Eosinophils Absolute: 0.5 10*3/uL (ref 0.0–0.7)
HCT: 35.7 % — ABNORMAL LOW (ref 39.0–52.0)
Hemoglobin: 11.7 g/dL — ABNORMAL LOW (ref 13.0–17.0)
LYMPHS ABS: 1 10*3/uL (ref 0.7–4.0)
LYMPHS PCT: 8 %
MCH: 32.7 pg (ref 26.0–34.0)
MCHC: 32.8 g/dL (ref 30.0–36.0)
MCV: 99.7 fL (ref 78.0–100.0)
MONO ABS: 0.8 10*3/uL (ref 0.1–1.0)
Monocytes Relative: 6 %
Neutro Abs: 9.9 10*3/uL — ABNORMAL HIGH (ref 1.7–7.7)
Neutrophils Relative %: 82 %
PLATELETS: 199 10*3/uL (ref 150–400)
RBC: 3.58 MIL/uL — ABNORMAL LOW (ref 4.22–5.81)
RDW: 14.5 % (ref 11.5–15.5)
WBC: 12.1 10*3/uL — ABNORMAL HIGH (ref 4.0–10.5)

## 2018-04-04 LAB — PROTIME-INR
INR: 1.14
Prothrombin Time: 14.5 seconds (ref 11.4–15.2)

## 2018-04-04 LAB — HEPATITIS B SURFACE ANTIGEN: Hepatitis B Surface Ag: NEGATIVE

## 2018-04-04 LAB — GLUCOSE, CAPILLARY: Glucose-Capillary: 116 mg/dL — ABNORMAL HIGH (ref 70–99)

## 2018-04-04 SURGERY — INSERTION, INTRAMEDULLARY ROD, FEMUR
Anesthesia: General | Site: Hip | Laterality: Right

## 2018-04-04 MED ORDER — SODIUM CHLORIDE 0.9 % IV SOLN: 100.0000 mL | INTRAVENOUS | Status: DC | PRN

## 2018-04-04 MED ORDER — ONDANSETRON HCL 4 MG/2ML IJ SOLN: 4.0000 mg | Freq: Four times a day (QID) | INTRAMUSCULAR | Status: DC | PRN

## 2018-04-04 MED ORDER — PROPOFOL 10 MG/ML IV BOLUS
INTRAVENOUS | Status: AC
  Filled 2018-04-04: qty 20

## 2018-04-04 MED ORDER — ONDANSETRON HCL 4 MG/2ML IJ SOLN
INTRAMUSCULAR | Status: DC | PRN
  Administered 2018-04-04: 4 mg via INTRAVENOUS

## 2018-04-04 MED ORDER — LIDOCAINE 2% (20 MG/ML) 5 ML SYRINGE
INTRAMUSCULAR | Status: DC | PRN
  Administered 2018-04-04: 40 mg via INTRAVENOUS

## 2018-04-04 MED ORDER — PANTOPRAZOLE SODIUM 40 MG PO TBEC
40.0000 mg | DELAYED_RELEASE_TABLET | Freq: Every day | ORAL | Status: DC
  Administered 2018-04-05 – 2018-04-09 (×4): 40 mg via ORAL
  Filled 2018-04-04 (×4): qty 1

## 2018-04-04 MED ORDER — PROPOFOL 10 MG/ML IV BOLUS
INTRAVENOUS | Status: DC | PRN
  Administered 2018-04-04: 150 mg via INTRAVENOUS

## 2018-04-04 MED ORDER — SODIUM CHLORIDE 0.9% FLUSH: 3.0000 mL | INTRAVENOUS | Status: DC | PRN

## 2018-04-04 MED ORDER — SODIUM CHLORIDE 0.9% FLUSH
3.0000 mL | Freq: Two times a day (BID) | INTRAVENOUS | Status: DC
  Administered 2018-04-04 – 2018-04-09 (×8): 3 mL via INTRAVENOUS

## 2018-04-04 MED ORDER — TRAMADOL HCL 50 MG PO TABS
50.0000 mg | ORAL_TABLET | Freq: Four times a day (QID) | ORAL | Status: DC | PRN
  Administered 2018-04-05 – 2018-04-08 (×4): 50 mg via ORAL
  Filled 2018-04-04 (×5): qty 1

## 2018-04-04 MED ORDER — LORAZEPAM 2 MG/ML IJ SOLN
1.0000 mg | Freq: Four times a day (QID) | INTRAMUSCULAR | Status: DC | PRN
  Administered 2018-04-06 – 2018-04-09 (×2): 1 mg via INTRAVENOUS
  Filled 2018-04-04 (×4): qty 1

## 2018-04-04 MED ORDER — ALLOPURINOL 100 MG PO TABS
200.0000 mg | ORAL_TABLET | Freq: Every day | ORAL | Status: DC
  Administered 2018-04-05 – 2018-04-09 (×5): 200 mg via ORAL
  Filled 2018-04-04 (×5): qty 2

## 2018-04-04 MED ORDER — SODIUM CHLORIDE 0.9 % IV SOLN
INTRAVENOUS | Status: DC
  Administered 2018-04-04: 13:00:00 via INTRAVENOUS

## 2018-04-04 MED ORDER — METOPROLOL TARTRATE 5 MG/5ML IV SOLN
5.0000 mg | Freq: Four times a day (QID) | INTRAVENOUS | Status: DC
  Administered 2018-04-04 – 2018-04-05 (×3): 5 mg via INTRAVENOUS
  Filled 2018-04-04 (×4): qty 5

## 2018-04-04 MED ORDER — PAROXETINE HCL 30 MG PO TABS
30.0000 mg | ORAL_TABLET | ORAL | Status: DC
  Administered 2018-04-05 – 2018-04-09 (×4): 30 mg via ORAL
  Filled 2018-04-04 (×6): qty 1

## 2018-04-04 MED ORDER — LIDOCAINE-PRILOCAINE 2.5-2.5 % EX CREA
1.0000 "application " | TOPICAL_CREAM | CUTANEOUS | Status: DC | PRN
  Filled 2018-04-04: qty 5

## 2018-04-04 MED ORDER — AMLODIPINE BESYLATE 5 MG PO TABS
5.0000 mg | ORAL_TABLET | Freq: Every day | ORAL | Status: DC
  Administered 2018-04-05 – 2018-04-09 (×5): 5 mg via ORAL
  Filled 2018-04-04 (×6): qty 1

## 2018-04-04 MED ORDER — LIDOCAINE 2% (20 MG/ML) 5 ML SYRINGE
INTRAMUSCULAR | Status: AC
  Filled 2018-04-04: qty 5

## 2018-04-04 MED ORDER — ONDANSETRON HCL 4 MG PO TABS: 4.0000 mg | ORAL_TABLET | Freq: Four times a day (QID) | ORAL | Status: DC | PRN

## 2018-04-04 MED ORDER — HYDRALAZINE HCL 20 MG/ML IJ SOLN: 10.0000 mg | Freq: Four times a day (QID) | INTRAMUSCULAR | Status: DC | PRN

## 2018-04-04 MED ORDER — CHLORHEXIDINE GLUCONATE 4 % EX LIQD: 60.0000 mL | Freq: Once | CUTANEOUS | Status: DC

## 2018-04-04 MED ORDER — SUCCINYLCHOLINE CHLORIDE 200 MG/10ML IV SOSY
PREFILLED_SYRINGE | INTRAVENOUS | Status: AC
  Filled 2018-04-04: qty 10

## 2018-04-04 MED ORDER — GABAPENTIN 300 MG PO CAPS: 300.0000 mg | ORAL_CAPSULE | Freq: Every evening | ORAL | Status: DC | PRN

## 2018-04-04 MED ORDER — MORPHINE SULFATE (PF) 2 MG/ML IV SOLN: 2.0000 mg | INTRAVENOUS | Status: DC | PRN

## 2018-04-04 MED ORDER — SUCCINYLCHOLINE CHLORIDE 20 MG/ML IJ SOLN
INTRAMUSCULAR | Status: DC | PRN
  Administered 2018-04-04: 140 mg via INTRAVENOUS

## 2018-04-04 MED ORDER — ASPIRIN 325 MG PO TABS
325.0000 mg | ORAL_TABLET | Freq: Every day | ORAL | Status: DC
  Administered 2018-04-04 – 2018-04-09 (×6): 325 mg via ORAL
  Filled 2018-04-04 (×6): qty 1

## 2018-04-04 MED ORDER — FENTANYL CITRATE (PF) 100 MCG/2ML IJ SOLN
12.5000 ug | INTRAMUSCULAR | Status: DC | PRN
  Administered 2018-04-05: 12.5 ug via INTRAVENOUS
  Filled 2018-04-04: qty 2

## 2018-04-04 MED ORDER — TAMSULOSIN HCL 0.4 MG PO CAPS
0.4000 mg | ORAL_CAPSULE | Freq: Every day | ORAL | Status: DC
  Administered 2018-04-05 – 2018-04-09 (×5): 0.4 mg via ORAL
  Filled 2018-04-04 (×6): qty 1

## 2018-04-04 MED ORDER — ROCURONIUM BROMIDE 50 MG/5ML IV SOSY
PREFILLED_SYRINGE | INTRAVENOUS | Status: AC
  Filled 2018-04-04: qty 5

## 2018-04-04 MED ORDER — CEFAZOLIN SODIUM-DEXTROSE 2-4 GM/100ML-% IV SOLN
2.0000 g | INTRAVENOUS | Status: AC
  Administered 2018-04-04: 2 g via INTRAVENOUS
  Filled 2018-04-04: qty 100

## 2018-04-04 MED ORDER — CEFAZOLIN SODIUM-DEXTROSE 2-4 GM/100ML-% IV SOLN
2.0000 g | Freq: Three times a day (TID) | INTRAVENOUS | Status: AC
  Administered 2018-04-05 (×3): 2 g via INTRAVENOUS
  Filled 2018-04-04 (×3): qty 100

## 2018-04-04 MED ORDER — POLYETHYLENE GLYCOL 3350 17 G PO PACK
17.0000 g | PACK | Freq: Every day | ORAL | Status: DC | PRN
  Administered 2018-04-08: 17 g via ORAL
  Filled 2018-04-04: qty 1

## 2018-04-04 MED ORDER — POVIDONE-IODINE 10 % EX SWAB: 2.0000 "application " | Freq: Once | CUTANEOUS | Status: AC

## 2018-04-04 MED ORDER — LORAZEPAM 2 MG/ML IJ SOLN
1.0000 mg | Freq: Once | INTRAMUSCULAR | Status: AC
  Administered 2018-04-05: 1 mg via INTRAMUSCULAR

## 2018-04-04 MED ORDER — QUETIAPINE FUMARATE 25 MG PO TABS
25.0000 mg | ORAL_TABLET | Freq: Every day | ORAL | Status: DC
  Administered 2018-04-04 – 2018-04-08 (×5): 25 mg via ORAL
  Filled 2018-04-04 (×5): qty 1

## 2018-04-04 MED ORDER — MIRTAZAPINE 15 MG PO TABS
15.0000 mg | ORAL_TABLET | Freq: Every day | ORAL | Status: DC
  Administered 2018-04-04 – 2018-04-08 (×5): 15 mg via ORAL
  Filled 2018-04-04 (×5): qty 1

## 2018-04-04 MED ORDER — ACETAMINOPHEN 650 MG RE SUPP: 650.0000 mg | Freq: Four times a day (QID) | RECTAL | Status: DC | PRN

## 2018-04-04 MED ORDER — RENA-VITE PO TABS
1.0000 | ORAL_TABLET | Freq: Every day | ORAL | Status: DC
  Administered 2018-04-05 – 2018-04-09 (×4): 1 via ORAL
  Filled 2018-04-04 (×4): qty 1

## 2018-04-04 MED ORDER — ISOSORBIDE MONONITRATE ER 60 MG PO TB24: 60.0000 mg | ORAL_TABLET | Freq: Every day | ORAL | Status: DC

## 2018-04-04 MED ORDER — ACETAMINOPHEN 325 MG PO TABS
650.0000 mg | ORAL_TABLET | Freq: Four times a day (QID) | ORAL | Status: DC | PRN
  Administered 2018-04-08 – 2018-04-09 (×2): 650 mg via ORAL
  Filled 2018-04-04: qty 2

## 2018-04-04 MED ORDER — ACETAMINOPHEN 500 MG PO TABS: 500.0000 mg | ORAL_TABLET | Freq: Four times a day (QID) | ORAL | Status: DC | PRN

## 2018-04-04 MED ORDER — CHLORHEXIDINE GLUCONATE CLOTH 2 % EX PADS
6.0000 | MEDICATED_PAD | Freq: Every day | CUTANEOUS | Status: DC
  Administered 2018-04-04 – 2018-04-07 (×3): 6 via TOPICAL

## 2018-04-04 MED ORDER — SODIUM CHLORIDE 0.9 % IV SOLN
250.0000 mL | INTRAVENOUS | Status: DC | PRN
  Administered 2018-04-04: 14:00:00 via INTRAVENOUS

## 2018-04-04 MED ORDER — PENTAFLUOROPROP-TETRAFLUOROETH EX AERO: 1.0000 "application " | INHALATION_SPRAY | CUTANEOUS | Status: DC | PRN

## 2018-04-04 MED ORDER — FENTANYL CITRATE (PF) 250 MCG/5ML IJ SOLN
INTRAMUSCULAR | Status: AC
  Filled 2018-04-04: qty 5

## 2018-04-04 MED ORDER — ROCURONIUM BROMIDE 50 MG/5ML IV SOSY
PREFILLED_SYRINGE | INTRAVENOUS | Status: DC | PRN
  Administered 2018-04-04: 10 mg via INTRAVENOUS

## 2018-04-04 MED ORDER — TRAZODONE HCL 50 MG PO TABS
50.0000 mg | ORAL_TABLET | Freq: Every evening | ORAL | Status: DC | PRN
  Administered 2018-04-09: 50 mg via ORAL
  Filled 2018-04-04: qty 1

## 2018-04-04 MED ORDER — HALOPERIDOL LACTATE 5 MG/ML IJ SOLN: 5.0000 mg | Freq: Four times a day (QID) | INTRAMUSCULAR | Status: DC | PRN

## 2018-04-04 MED ORDER — FENTANYL CITRATE (PF) 100 MCG/2ML IJ SOLN
INTRAMUSCULAR | Status: DC | PRN
  Administered 2018-04-04: 50 ug via INTRAVENOUS
  Administered 2018-04-04 (×2): 25 ug via INTRAVENOUS
  Administered 2018-04-04: 50 ug via INTRAVENOUS
  Administered 2018-04-04: 25 ug via INTRAVENOUS

## 2018-04-04 MED ORDER — ONDANSETRON HCL 4 MG/2ML IJ SOLN
INTRAMUSCULAR | Status: AC
  Filled 2018-04-04: qty 2

## 2018-04-04 MED ORDER — CALCIUM CARBONATE ANTACID 500 MG PO CHEW
2.0000 | CHEWABLE_TABLET | Freq: Three times a day (TID) | ORAL | Status: DC
  Administered 2018-04-04 – 2018-04-09 (×10): 400 mg via ORAL
  Filled 2018-04-04 (×10): qty 2

## 2018-04-04 MED ORDER — METOPROLOL SUCCINATE ER 25 MG PO TB24
25.0000 mg | ORAL_TABLET | Freq: Every day | ORAL | Status: DC
  Filled 2018-04-04: qty 1

## 2018-04-04 MED ORDER — 0.9 % SODIUM CHLORIDE (POUR BTL) OPTIME
TOPICAL | Status: DC | PRN
  Administered 2018-04-04: 1000 mL

## 2018-04-04 MED ORDER — ALBUTEROL SULFATE (2.5 MG/3ML) 0.083% IN NEBU: 2.5000 mg | INHALATION_SOLUTION | RESPIRATORY_TRACT | Status: DC | PRN

## 2018-04-04 SURGICAL SUPPLY — 54 items
BANDAGE ACE 4X5 VEL STRL LF (GAUZE/BANDAGES/DRESSINGS) ×3 IMPLANT
BANDAGE ACE 6X5 VEL STRL LF (GAUZE/BANDAGES/DRESSINGS) IMPLANT
BANDAGE ELASTIC 6 VELCRO ST LF (GAUZE/BANDAGES/DRESSINGS) ×3 IMPLANT
BIT DRILL FLUTED FEMUR 4.2/3 (BIT) ×3 IMPLANT
BLADE SURG 10 STRL SS (BLADE) ×6 IMPLANT
BLADE TFNA HELICAL 95 NON STRL (Anchor) ×3 IMPLANT
BNDG COHESIVE 4X5 TAN STRL (GAUZE/BANDAGES/DRESSINGS) ×3 IMPLANT
BNDG COHESIVE 6X5 TAN STRL LF (GAUZE/BANDAGES/DRESSINGS) IMPLANT
BRUSH SCRUB SURG 4.25 DISP (MISCELLANEOUS) ×6 IMPLANT
CHLORAPREP W/TINT 26ML (MISCELLANEOUS) ×3 IMPLANT
COVER SURGICAL LIGHT HANDLE (MISCELLANEOUS) ×3 IMPLANT
COVER WAND RF STERILE (DRAPES) ×3 IMPLANT
DRAPE C-ARM 35X43 STRL (DRAPES) ×3 IMPLANT
DRAPE C-ARMOR (DRAPES) ×3 IMPLANT
DRAPE HALF SHEET 40X57 (DRAPES) ×6 IMPLANT
DRAPE IMP U-DRAPE 54X76 (DRAPES) ×6 IMPLANT
DRAPE INCISE IOBAN 66X45 STRL (DRAPES) IMPLANT
DRAPE ORTHO SPLIT 77X108 STRL (DRAPES) ×4
DRAPE STERI IOBAN 125X83 (DRAPES) ×3 IMPLANT
DRAPE SURG 17X23 STRL (DRAPES) ×3 IMPLANT
DRAPE SURG ORHT 6 SPLT 77X108 (DRAPES) ×2 IMPLANT
DRAPE U-SHAPE 47X51 STRL (DRAPES) ×3 IMPLANT
DRESSING ALLEVYN LIFE SACRUM (GAUZE/BANDAGES/DRESSINGS) ×3 IMPLANT
DRSG MEPILEX BORDER 4X4 (GAUZE/BANDAGES/DRESSINGS) ×3 IMPLANT
DRSG MEPILEX BORDER 4X8 (GAUZE/BANDAGES/DRESSINGS) ×3 IMPLANT
ELECT REM PT RETURN 9FT ADLT (ELECTROSURGICAL) ×3
ELECTRODE REM PT RTRN 9FT ADLT (ELECTROSURGICAL) ×1 IMPLANT
GLOVE BIO SURGEON STRL SZ7.5 (GLOVE) ×9 IMPLANT
GLOVE BIOGEL PI IND STRL 7.5 (GLOVE) ×1 IMPLANT
GLOVE BIOGEL PI INDICATOR 7.5 (GLOVE) ×2
GOWN STRL REUS W/ TWL LRG LVL3 (GOWN DISPOSABLE) ×3 IMPLANT
GOWN STRL REUS W/TWL LRG LVL3 (GOWN DISPOSABLE) ×6
GOWN STRL REUS W/TWL XL LVL3 (GOWN DISPOSABLE) ×3 IMPLANT
GUIDEWIRE 3.2X400 (WIRE) ×6 IMPLANT
KIT BASIN OR (CUSTOM PROCEDURE TRAY) ×3 IMPLANT
KIT TURNOVER KIT B (KITS) ×3 IMPLANT
MANIFOLD NEPTUNE II (INSTRUMENTS) IMPLANT
NAIL TROCH FIX 10X170 130 (Nail) ×3 IMPLANT
NS IRRIG 1000ML POUR BTL (IV SOLUTION) ×3 IMPLANT
PACK GENERAL/GYN (CUSTOM PROCEDURE TRAY) ×3 IMPLANT
PAD ARMBOARD 7.5X6 YLW CONV (MISCELLANEOUS) ×6 IMPLANT
SCREW LOCK STAR 5X38 (Screw) ×3 IMPLANT
STAPLER VISISTAT 35W (STAPLE) ×3 IMPLANT
STOCKINETTE IMPERVIOUS LG (DRAPES) ×3 IMPLANT
SUT ETHILON 3 0 PS 1 (SUTURE) ×3 IMPLANT
SUT MNCRL AB 3-0 PS2 18 (SUTURE) ×6 IMPLANT
SUT VIC AB 0 CT1 27 (SUTURE) ×2
SUT VIC AB 0 CT1 27XBRD ANBCTR (SUTURE) ×1 IMPLANT
SUT VIC AB 2-0 CT1 27 (SUTURE) ×4
SUT VIC AB 2-0 CT1 TAPERPNT 27 (SUTURE) ×2 IMPLANT
TOWEL OR 17X24 6PK STRL BLUE (TOWEL DISPOSABLE) ×3 IMPLANT
TOWEL OR 17X26 10 PK STRL BLUE (TOWEL DISPOSABLE) ×6 IMPLANT
UNDERPAD 30X30 (UNDERPADS AND DIAPERS) IMPLANT
WATER STERILE IRR 1000ML POUR (IV SOLUTION) ×3 IMPLANT

## 2018-04-04 NOTE — Consult Note (Signed)
Renal Service Consult Note [facility]  [name] [name] 04/04/2018 [name] [name] Requesting Physician:  Dr Broadus John  Reason for Consult:  ESRD pt w/ hip fracture HPI: The patient is a 83 y.o. year-old with hx of melanoma, HTN, ESRD on HD, DM, RCC sp R nephrectomy, dementia. Pt on HD in [facility] TTS per Dr Hinda Lenis.  Pt fell out of WC and sent to [facility] ED for R hip / leg pain.  Xrays showed R hip fracture.  Pt was transerred to [facility]. Needs hip surgery, asked to see for ESRD/ dialysis needs.   Pt eyes closed, answers simple questions, doesn't provide any helpful history.   ROS  n/a   Past Medical History  Past Medical History:  Diagnosis Date  . Ankle fracture    Left ~08/2011. [place] fracture  . Arthritis   . Depression   . DM (diabetes mellitus) ([place])    Per pt, borderline   . ESRD on dialysis ([place])    Left fistula   . Headache(784.0)   . HTN (hypertension)   . Leaky heart valve    Hx: of  . Melanoma ([place])   . Mitral regurgitation    Moderate/severe, echo, 01/2013  . Neuropathy   . Rash    over entire body  . Renal cell cancer ([place])    Remote. S/p right nephrectomy.    Past Surgical History  Past Surgical History:  Procedure Laterality Date  . COLONOSCOPY W/ BIOPSIES AND POLYPECTOMY     Hx: of  . EXTERNAL FIXATION LEG  12/02/2011   Procedure: EXTERNAL FIXATION LEG;  Surgeon: Hessie Dibble, MD;  Location: [place];  Service: Orthopedics;  Laterality: Left;  . FISTULOGRAM N/A 05/12/2013   Procedure: FISTULOGRAM;  Surgeon: Angelia Mould, MD;  Location: [facility] CATH LAB;  Service: Cardiovascular;  Laterality: N/A;  . HARDWARE REMOVAL  12/02/2011   Procedure: HARDWARE REMOVAL;  Surgeon: Hessie Dibble, MD;  Location: [place];  Service: Orthopedics;  Laterality: Left;  . HERNIA REPAIR    . I&D EXTREMITY  12/02/2011   Procedure: IRRIGATION AND DEBRIDEMENT EXTREMITY;  Surgeon: Hessie Dibble, MD;  Location: [place];  Service: Orthopedics;  Laterality: Left;   . ORIF ANKLE FRACTURE  12/07/2011   Procedure: OPEN REDUCTION INTERNAL FIXATION (ORIF) ANKLE FRACTURE;  Surgeon: Rozanna Box, MD;  Location: [place];  Service: Orthopedics;  Laterality: Left;  ORIF left pilon  . PERIPHERAL VASCULAR CATHETERIZATION Left 05/31/2015   Procedure: A/V Shuntogram/Fistulagram;  Surgeon: Conrad Christine, MD;  Location: [place] CV LAB;  Service: Cardiovascular;  Laterality: Left;  arm  . REVISON OF ARTERIOVENOUS FISTULA Left 05/23/2013   Procedure: REVISON OF ARTERIOVENOUS FISTULA;  Surgeon: Angelia Mould, MD;  Location: [facility] OR;  Service: Vascular;  Laterality: Left;   Family History  Family History  Problem Relation Age of Onset  . Pancreatic cancer Father   . Dementia Mother    Social History  reports that he quit smoking about 47 years ago. His smoking use included cigarettes. He has a 8.00 pack-year smoking history. He has never used smokeless tobacco. He reports that he does not drink alcohol or use drugs. Allergies  Allergies  Allergen Reactions  . Doxycycline   . Ivp Dye [Iodinated Diagnostic Agents]     Unknown  . Ofloxacin Nausea And Vomiting  . Oxycodone     Altered mental status  . Sulfa Antibiotics Rash  . Sulfamethoxazole-Trimethoprim Rash   Home medications Prior to  Admission medications   Medication Sig Start Date End Date Taking? Authorizing Provider  acetaminophen (TYLENOL) 500 MG tablet Take 500 mg by mouth every 6 (six) hours as needed for moderate pain.     [provider]  allopurinol (ZYLOPRIM) 100 MG tablet Take 200 mg by mouth daily.     [provider]  amLODipine (NORVASC) 5 MG tablet Take 1 tablet (5 mg total) by mouth daily. Patient not taking: Reported on 05/26/2015 06/02/13   Herminio Commons, MD  calcium carbonate (TUMS - DOSED IN MG ELEMENTAL CALCIUM) 500 MG chewable tablet Chew 2 tablets by mouth 4 (four) times daily -  with meals and at bedtime.    [provider]  docusate sodium  (COLACE) 100 MG capsule Take 100 mg by mouth daily as needed for constipation.    [provider]  gabapentin (NEURONTIN) 300 MG capsule Take 300-900 mg by mouth at bedtime as needed (pain).     [provider]  isosorbide mononitrate (IMDUR) 60 MG 24 hr tablet Take 1 tablet (60 mg total) by mouth daily. Patient not taking: Reported on 05/26/2015 06/02/13   Herminio Commons, MD  metoprolol succinate (TOPROL-XL) 25 MG 24 hr tablet Take 25 mg by mouth daily.    [provider]  mirtazapine (REMERON) 15 MG tablet Take 15 mg by mouth at bedtime.    [provider]  multivitamin (RENA-VIT) TABS tablet Take 1 tablet by mouth daily.    [provider]  omeprazole (PRILOSEC) 20 MG capsule Take 20 mg by mouth 2 (two) times daily before a meal.    [provider]  PARoxetine (PAXIL) 30 MG tablet Take 30 mg by mouth every morning.    [provider]  QUEtiapine (SEROQUEL) 25 MG tablet Take 25 mg by mouth at bedtime.    [provider]  tamsulosin (FLOMAX) 0.4 MG CAPS capsule Take 0.4 mg by mouth daily.    [provider]   Liver Function Tests No results for input(s): AST, ALT, ALKPHOS, BILITOT, PROT, ALBUMIN in the last 168 hours. No results for input(s): LIPASE, AMYLASE in the last 168 hours. CBC Recent Labs  Lab 04/04/18 0442  WBC 12.1*  NEUTROABS 9.9*  HGB 11.7*  HCT 35.7*  MCV 99.7  PLT 856   Basic Metabolic Panel Recent Labs  Lab 04/04/18 0442  NA 138  K 4.1  CL 97*  CO2 28  GLUCOSE 112*  BUN 25*  CREATININE 6.05*  CALCIUM 8.2*   Iron/TIBC/Ferritin/ %Sat No results found for: IRON, TIBC, FERRITIN, IRONPCTSAT  Vitals:   04/04/18 0330 04/04/18 0628 04/04/18 0752 04/04/18 0805  BP: (!) 168/80 (!) 174/93  (!) 170/80  Pulse:  90  84  Temp:    97.8 F (36.6 C)  TempSrc:    Oral  SpO2:  95%  98%  Weight:   97.5 kg   Height:   5\' 9"  (1.753 m)    Exam Gen eyes closed, answers simple questions,  not oriented No rash, cyanosis or gangrene Sclera anicteric, throat clear  No jvd or bruits Chest dec'd air movement bilat, no wheezing or rales, no ^wob RRR no MRG Abd soft ntnd no mass or ascites +bs GU normal male, +SP cath draining yellow urine MS no joint effusions or deformity Ext 1+ L pretib edema, no wounds or ulcers Neuro is somnolent, arouses easily follows a few commands, not oriented    Home meds:  - amlodipine 5/ metoprolol xl 25  qd/ isosorbide mononitrate 60  - allopruinol 200/ omeprazole 20/ tamsulosin 0.4 qd  - quetiapine 25 hs/ paroxetine 30 am/ mirtazapine 15 hs  - gabapentin 300-900 qhs prn  - colace/ vitamins  CXR - vasc congestion, no overt edema  Dialysis: TTS [facility]  3.5h   68kg    Hep 1500 + 300/hr   LFA AVF   - IV vanc 1250mg  q HD thru 10/10 (for UTI dx'd at [facility])   Impression: 1. Fall/ R hip fracture 2. ESRD on HD [facility]TS [facility] [place]. Lytes ok.  3. Volume - at dry wt, no vol excess on exam, resp status stable on RA 4. Dementia 5. H/o RCC sp R nephrectomy 6. HTN - cont meds  Plan - Stable for surgery from renal standpoint. Will plan HD later today after surgery, or possibly tomorrow.   Kelly Splinter MD [employer] pager [phone]   04/04/2018, 9:24 AM

## 2018-04-04 NOTE — Progress Notes (Signed)
HD tx ended 35 min early d/t pt AMS: waking up from surgery and being in pain, wanted off tx, needles to be pulled and everything unhooked , tried to explain he only had 54 min left of tx , tried to get OOB, and then c/o needing to blow his nose and he wanted off the machine to have arms free to do that, I attempted to help him do this and he said only he could do it. I reinforced the need for pt to try and finish tx and why, asked could he make it another 20 min on the tx and that way it would only be 20 min early, pt said no, take me off now, began cussing me, trying to get OOB again. tx stopped w/ 35 min left, UF goal not met, blood rinsed back, VSS. Pt didn't want dsg to stay on his needle sites. I explained to him I could not take those off and he needed to leave them on for now as well so as not to start bleeding again. Pt still cussing at me and trying to get OOB while we awaited transport to get there. Pt just over all very confused and verbally aggressive. Report called to Golden Pop, RN

## 2018-04-04 NOTE — Progress Notes (Signed)
PROGRESS NOTE    [name] [name]  KGY:[id number] DOB: 1934/10/26 DOA: 04/04/2018 PCP: Deloria Lair., MD  Brief Narrative: [name] [name] is an 83 year old male with history of dementia, ESRD on hemodialysis Tuesday Thursday Saturday, resident of [facility], wheelchair-bound at baseline presented to the ER after mechanical fall with right comminuted intertrochanteric femur fracture. -Orthopedics consulting   Assessment & Plan:   -Right hip intertrochanteric femur fracture  -Orthopedics Dr. Catalina Gravel  -Plan for OR later today  -He is moderate risk for an intermediate risk procedure, continue perioperative beta-blocker will change this to IV for now, no further cardiac work-up recommended -Will need DVT prophylaxis postop  End-stage renal disease on hemodialysis Tuesday Thursday Saturday -Last HD was on Tuesday, due for hemodialysis today -Volume status is stable -Renal consulted plan for HD postop  Advanced dementia with behavioral disturbance -Continue Seroquel and Remeron nightly  Hypertension -Uncontrolled, continue imdur, amlodipine, IV Lopressor inlieu of Toprol-XL  Social/Ethics--- patient is from [facility] [facility], is a DNR/DNI  DVT prophylaxis: Start heparin postop Code Status: DNR Family Communication: No family at bedside, will attempt to contact grandson Disposition Plan: Back to SNF when stable  Consultants:   Ortho  Renal   Procedures:   Antimicrobials:    Subjective: -remains confused, no events overnight -Refusing meds  Objective: Vitals:   04/04/18 0330 04/04/18 0628 04/04/18 0752 04/04/18 0805  BP: (!) 168/80 (!) 174/93  (!) 170/80  Pulse:  90  84  Temp:    97.8 F (36.6 C)  TempSrc:    Oral  SpO2:  95%  98%  Weight:   97.5 kg   Height:   5\' 9"  (1.753 m)    No intake or output data in the 24 hours ending 04/04/18 1418 Filed Weights   04/04/18 0304 04/04/18 0752  Weight: 97.5 kg 97.5 kg     Examination:  General exam: Elderly male, laying in bed, alert awake oriented to self only Respiratory system: Rare basilar crackles, rest clear Cardiovascular system: S1 & S2 heard, RRR, mild systolic murmur appreciated  gastrointestinal system: Abdomen is nondistended, soft and nontender.Normal bowel sounds heard. Central nervous system: Alert and oriented. No focal neurological deficits. Extremities: Right lower extremity is shortened and externally rotated Skin: No rashes, lesions or ulcers Psychiatry: un Able to assess   Data Reviewed:   CBC: Recent Labs  Lab 04/04/18 0442  WBC 12.1*  NEUTROABS 9.9*  HGB 11.7*  HCT 35.7*  MCV 99.7  PLT 026   Basic Metabolic Panel: Recent Labs  Lab 04/04/18 0442  NA 138  K 4.1  CL 97*  CO2 28  GLUCOSE 112*  BUN 25*  CREATININE 6.05*  CALCIUM 8.2*   GFR: Estimated Creatinine Clearance: 11 mL/min (A) (by C-G formula based on SCr of 6.05 mg/dL (H)). Liver Function Tests: No results for input(s): AST, ALT, ALKPHOS, BILITOT, PROT, ALBUMIN in the last 168 hours. No results for input(s): LIPASE, AMYLASE in the last 168 hours. No results for input(s): AMMONIA in the last 168 hours. Coagulation Profile: Recent Labs  Lab 04/04/18 0442  INR 1.14   Cardiac Enzymes: No results for input(s): CKTOTAL, CKMB, CKMBINDEX, TROPONINI in the last 168 hours. BNP (last 3 results) No results for input(s): PROBNP in the last 8760 hours. HbA1C: No results for input(s): HGBA1C in the last 72 hours. CBG: No results for input(s): GLUCAP in the last 168 hours. Lipid Profile: No results for input(s): CHOL, HDL, LDLCALC, TRIG, CHOLHDL, LDLDIRECT in  the last 72 hours. Thyroid Function Tests: No results for input(s): TSH, T4TOTAL, FREET4, T3FREE, THYROIDAB in the last 72 hours. Anemia Panel: No results for input(s): VITAMINB12, FOLATE, FERRITIN, TIBC, IRON, RETICCTPCT in the last 72 hours. Urine analysis:    Component Value Date/Time    COLORURINE YELLOW 12/05/2011 1528   APPEARANCEUR HAZY (A) 12/05/2011 1528   LABSPEC 1.011 12/05/2011 1528   PHURINE 7.5 12/05/2011 1528   GLUCOSEU NEGATIVE 12/05/2011 1528   HGBUR LARGE (A) 12/05/2011 1528   BILIRUBINUR NEGATIVE 12/05/2011 1528   KETONESUR NEGATIVE 12/05/2011 1528   PROTEINUR 100 (A) 12/05/2011 1528   UROBILINOGEN 0.2 12/05/2011 1528   NITRITE NEGATIVE 12/05/2011 1528   LEUKOCYTESUR TRACE (A) 12/05/2011 1528   Sepsis Labs: @LABRCNTIP (procalcitonin:4,lacticidven:4)  )No results found for this or any previous visit (from the past 240 hour(s)).       Radiology Studies: Dg Pelvis 1-2 Views  Result Date: 04/04/2018 CLINICAL DATA:  83 y/o M; fall from wheelchair with right hip and upper leg pain. EXAM: RIGHT FEMUR 2 VIEWS; PELVIS - 1-2 VIEW COMPARISON:  None. FINDINGS: Right femur: Acute comminuted intratrochanteric fracture of the right femur with mild displacement of fracture components and coxa vara angulation. The femoral head is well seated within the acetabulum. No additional fracture of the femur identified. The knee joint is grossly maintained. Vascular calcifications noted. Pelvis: No acute pelvic fracture or diastasis. Hip joints are well maintained. Degenerative changes of the lower lumbar spine. Vascular calcifications noted. IMPRESSION: Acute comminuted intratrochanteric fracture of the right femur with mild displacement of fracture components and coxa vara angulation. Electronically Signed   By: Kristine Garbe M.D.   On: 04/04/2018 04:23   Dg Chest Port 1 View  Result Date: 04/04/2018 CLINICAL DATA:  Preop.  Fall out of wheelchair with hip fracture. EXAM: PORTABLE CHEST 1 VIEW COMPARISON:  Radiograph 01/03/2018 FINDINGS: Cardiomegaly is unchanged. Minimal vascular congestion without pulmonary edema in a. small right pleural effusion on thoracic spine CT not well seen radiographically. No focal airspace disease or pneumothorax. No acute osseous  abnormalities are seen. IMPRESSION: Cardiomegaly with vascular congestion.  No pulmonary edema. Electronically Signed   By: Keith Rake M.D.   On: 04/04/2018 04:50   Dg Femur Min 2 Views Right  Result Date: 04/04/2018 CLINICAL DATA:  83 y/o M; fall from wheelchair with right hip and upper leg pain. EXAM: RIGHT FEMUR 2 VIEWS; PELVIS - 1-2 VIEW COMPARISON:  None. FINDINGS: Right femur: Acute comminuted intratrochanteric fracture of the right femur with mild displacement of fracture components and coxa vara angulation. The femoral head is well seated within the acetabulum. No additional fracture of the femur identified. The knee joint is grossly maintained. Vascular calcifications noted. Pelvis: No acute pelvic fracture or diastasis. Hip joints are well maintained. Degenerative changes of the lower lumbar spine. Vascular calcifications noted. IMPRESSION: Acute comminuted intratrochanteric fracture of the right femur with mild displacement of fracture components and coxa vara angulation. Electronically Signed   By: Kristine Garbe M.D.   On: 04/04/2018 04:23        Scheduled Meds: . [MAR Hold] allopurinol  200 mg Oral Daily  . [MAR Hold] amLODipine  5 mg Oral Daily  . [MAR Hold] calcium carbonate  2 tablet Oral TID WC & HS  . chlorhexidine  60 mL Topical Once  . [MAR Hold] Chlorhexidine Gluconate Cloth  6 each Topical Q0600  . [MAR Hold] isosorbide mononitrate  60 mg Oral Daily  . [MAR Hold] metoprolol tartrate  5 mg Intravenous Q6H  . [MAR Hold] mirtazapine  15 mg Oral QHS  . [MAR Hold] multivitamin  1 tablet Oral Daily  . [MAR Hold] pantoprazole  40 mg Oral Daily  . [MAR Hold] PARoxetine  30 mg Oral BH-q7a  . [MAR Hold] QUEtiapine  25 mg Oral QHS  . [MAR Hold] sodium chloride flush  3 mL Intravenous Q12H  . [MAR Hold] tamsulosin  0.4 mg Oral Daily   Continuous Infusions: . [MAR Hold] sodium chloride    . sodium chloride 10 mL/hr at 04/04/18 1312     LOS: 0 days    Time  spent: 29min    Domenic Polite, MD [facility] Page via [email or web address], password [email or web address] After 7PM please contact night-coverage  04/04/2018, 2:18 PM

## 2018-04-04 NOTE — Anesthesia Procedure Notes (Signed)
Procedure Name: Intubation Date/Time: 04/04/2018 1:39 PM Performed by: Scheryl Darter, CRNA Pre-anesthesia Checklist: Patient identified, Emergency Drugs available, Suction available and Patient being monitored Patient Re-evaluated:Patient Re-evaluated prior to induction Oxygen Delivery Method: Circle System Utilized Preoxygenation: Pre-oxygenation with 100% oxygen Induction Type: IV induction Ventilation: Mask ventilation without difficulty and Oral airway inserted - appropriate to patient size Laryngoscope Size: Sabra Heck and 2 Grade View: Grade I Tube type: Oral Tube size: 7.5 mm Number of attempts: 1 Airway Equipment and Method: Stylet and Oral airway Placement Confirmation: ETT inserted through vocal cords under direct vision,  positive ETCO2 and breath sounds checked- equal and bilateral Secured at: 23 cm Tube secured with: Tape Dental Injury: Teeth and Oropharynx as per pre-operative assessment  Comments: Intubated by [facility]

## 2018-04-04 NOTE — H&P (Signed)
Patient Demographics:    [name] [name], is a 83 y.o. male  MRN: [id number]   DOB - 1935/01/26  Admit Date - 04/04/2018  Outpatient Primary MD for the patient is Deloria Lair., MD   Assessment & Plan:    Active Problems:   DM (diabetes mellitus) ([place])   ESRD on dialysis ([place])   Anemia in CKD (chronic kidney disease)   Hip fracture (HCC)   1)Rt Hip Fx---  Please call  ortho Dr. Marzetta Merino pt arrives from [facility] to [facility] at [facility] d/w Dr Doreatha Martin ortho to see , morphine sulfate as needed for pain, please note that at baseline patient is wheelchair-bound  2)ESRD-Chest x-ray suggest venous congestion without pulmonary edema, patient will need hemodialysis.  Please call nephrology consult for hemodialysis Tuesdays Thursdays and Saturdays  3)HTN-BP is not at goal, resume amlodipine 5 mg daily, Toprol-XL 25 mg daily, Imdur 60 mg daily,   may use IV Hydralazine 10 mg  Every 4 hours Prn for systolic blood pressure over 160 mmhg  4)Dementia with behavioral disturbance--- continue Seroquel 25 mg nightly, Remeron 15 mg nightly, okay to use IV Lorazepam as needed agitation  5)Social/Ethics--- patient is from [facility], is a DNR/DNI Grandson Erich Kochan [phone]--- is primary contact  With History of - Reviewed by me  Past Medical History:  Diagnosis Date  . Ankle fracture    Left ~08/2011. [place] fracture  . Arthritis   . Depression   . DM (diabetes mellitus) ([place])    Per pt, borderline   . ESRD on dialysis ([place])    Left fistula   . Headache(784.0)   . HTN (hypertension)   . Leaky heart valve    Hx: of  . Melanoma ([place])   . Mitral regurgitation    Moderate/severe, echo, 01/2013  . Neuropathy   . Rash    over entire body  . Renal cell cancer ([place])    Remote. S/p right  nephrectomy.       Past Surgical History:  Procedure Laterality Date  . COLONOSCOPY W/ BIOPSIES AND POLYPECTOMY     Hx: of  . EXTERNAL FIXATION LEG  12/02/2011   Procedure: EXTERNAL FIXATION LEG;  Surgeon: Hessie Dibble, MD;  Location: [place];  Service: Orthopedics;  Laterality: Left;  . FISTULOGRAM N/A 05/12/2013   Procedure: FISTULOGRAM;  Surgeon: Angelia Mould, MD;  Location: [facility] CATH LAB;  Service: Cardiovascular;  Laterality: N/A;  . HARDWARE REMOVAL  12/02/2011   Procedure: HARDWARE REMOVAL;  Surgeon: Hessie Dibble, MD;  Location: [place];  Service: Orthopedics;  Laterality: Left;  . HERNIA REPAIR    . I&D EXTREMITY  12/02/2011   Procedure: IRRIGATION AND DEBRIDEMENT EXTREMITY;  Surgeon: Hessie Dibble, MD;  Location: [place];  Service: Orthopedics;  Laterality: Left;  . ORIF ANKLE FRACTURE  12/07/2011   Procedure: OPEN REDUCTION INTERNAL FIXATION (ORIF) ANKLE FRACTURE;  Surgeon: Astrid Divine  Marcelino Scot, MD;  Location: [place];  Service: Orthopedics;  Laterality: Left;  ORIF left pilon  . PERIPHERAL VASCULAR CATHETERIZATION Left 05/31/2015   Procedure: A/V Shuntogram/Fistulagram;  Surgeon: Conrad Newburgh, MD;  Location: [place] CV LAB;  Service: Cardiovascular;  Laterality: Left;  arm  . REVISON OF ARTERIOVENOUS FISTULA Left 05/23/2013   Procedure: REVISON OF ARTERIOVENOUS FISTULA;  Surgeon: Angelia Mould, MD;  Location: [place];  Service: Vascular;  Laterality: Left;      Chief Complaint  Patient presents with  . Emesis      HPI:    [name] [name]  is a 83 y.o. male with past medical history relevant for hypertension, dementia and ESRD on hemodialysis on Tuesdays Thursdays and Saturdays who presents from [place] home with complaints of right hip pain after falling off his wheelchair  Baseline patient has advanced dementia with severe cognitive deficits and is a very poor historian, at baseline patient is also wheelchair-bound, he was found around 1:30 AM by staff  in the dining room having fallen out of his wheelchair  I called and spoke with patient's grandson Mr. Donnetta Simpers--- [phone], additional information obtained, plan of care discussed  Rt Hip Xrays  Acute comminuted intratrochanteric fracture of the right femur with mild displacement of fracture components and coxa vara angulation.  White count is 12.1  (Leukocytosis suspect this is reactive due to acute hip fracture    Review of systems:    In addition to the HPI above,   A full Review of  Systems was done, all other systems reviewed are negative except as noted above in HPI , .    Social History:  Reviewed by me    Social History   Tobacco Use  . Smoking status: Former Smoker    Packs/day: 0.80    Years: 10.00    Pack years: 8.00    Types: Cigarettes    Last attempt to quit: 07/03/1970    Years since quitting: 47.7  . Smokeless tobacco: Never Used  Substance Use Topics  . Alcohol use: No       Family History :  Reviewed by me    Family History  Problem Relation Age of Onset  . Pancreatic cancer Father   . Dementia Mother      Home Medications:   Prior to Admission medications   Medication Sig Start Date End Date Taking? Authorizing Provider  acetaminophen (TYLENOL) 500 MG tablet Take 500 mg by mouth every 6 (six) hours as needed for moderate pain.     [provider]  allopurinol (ZYLOPRIM) 100 MG tablet Take 200 mg by mouth daily.     [provider]  amLODipine (NORVASC) 5 MG tablet Take 1 tablet (5 mg total) by mouth daily. Patient not taking: Reported on 05/26/2015 06/02/13   Herminio Commons, MD  calcium carbonate (TUMS - DOSED IN MG ELEMENTAL CALCIUM) 500 MG chewable tablet Chew 2 tablets by mouth 4 (four) times daily -  with meals and at bedtime.    [provider]  docusate sodium (COLACE) 100 MG capsule Take 100 mg by mouth daily as needed for constipation.    [provider]  gabapentin (NEURONTIN) 300 MG  capsule Take 300-900 mg by mouth at bedtime as needed (pain).     [provider]  isosorbide mononitrate (IMDUR) 60 MG 24 hr tablet Take 1 tablet (60 mg total) by mouth daily. Patient not taking: Reported on 05/26/2015 06/02/13  Herminio Commons, MD  metoprolol succinate (TOPROL-XL) 25 MG 24 hr tablet Take 25 mg by mouth daily.    [provider]  mirtazapine (REMERON) 15 MG tablet Take 15 mg by mouth at bedtime.    [provider]  multivitamin (RENA-VIT) TABS tablet Take 1 tablet by mouth daily.    [provider]  omeprazole (PRILOSEC) 20 MG capsule Take 20 mg by mouth 2 (two) times daily before a meal.    [provider]  PARoxetine (PAXIL) 30 MG tablet Take 30 mg by mouth every morning.    [provider]  QUEtiapine (SEROQUEL) 25 MG tablet Take 25 mg by mouth at bedtime.    [provider]  tamsulosin (FLOMAX) 0.4 MG CAPS capsule Take 0.4 mg by mouth daily.    [provider]     Allergies:     Allergies  Allergen Reactions  . Doxycycline   . Ivp Dye [Iodinated Diagnostic Agents]     Unknown  . Ofloxacin Nausea And Vomiting  . Oxycodone     Altered mental status  . Sulfa Antibiotics Rash  . Sulfamethoxazole-Trimethoprim Rash     Physical Exam:   Vitals  Blood pressure (!) 168/80, height 5\' 9"  (1.753 m), weight 97.5 kg.  Physical Examination: General appearance -sleepy after Ativan mental status -apparently was agitated earlier, sleepy now after Ativan  eyes - sclera anicteric Neck - supple, no JVD elevation , Chest -diminished in bases with faint bibasilar rales Heart - S1 and S2 normal, regular Abdomen - soft, nontender, nondistended, no masses or organomegaly Neurological -cognitive deficits, range of motion of right lower extremity is limited due to acute fracture Extremities - no pedal edema noted, intact peripheral pulses,, left forearm with AV fistula with positive thrill and bruit Skin  - warm, dry     Data Review:    CBC Recent Labs  Lab 04/04/18 0442  WBC 12.1*  HGB 11.7*  HCT 35.7*  PLT 199  MCV 99.7  MCH 32.7  MCHC 32.8  RDW 14.5  LYMPHSABS 1.0  MONOABS 0.8  EOSABS 0.5  BASOSABS 0.1   ------------------------------------------------------------------------------------------------------------------  Chemistries  Recent Labs  Lab 04/04/18 0442  NA 138  K 4.1  CL 97*  CO2 28  GLUCOSE 112*  BUN 25*  CREATININE 6.05*  CALCIUM 8.2*   ------------------------------------------------------------------------------------------------------------------ estimated creatinine clearance is 11 mL/min (A) (by C-G formula based on SCr of 6.05 mg/dL (H)). ------------------------------------------------------------------------------------------------------------------ No results for input(s): TSH, T4TOTAL, T3FREE, THYROIDAB in the last 72 hours.  Invalid input(s): FREET3   Coagulation profile No results for input(s): INR, PROTIME in the last 168 hours. ------------------------------------------------------------------------------------------------------------------- No results for input(s): DDIMER in the last 72 hours. -------------------------------------------------------------------------------------------------------------------  Cardiac Enzymes No results for input(s): CKMB, TROPONINI, MYOGLOBIN in the last 168 hours.  Invalid input(s): CK ------------------------------------------------------------------------------------------------------------------ No results found for: BNP   ---------------------------------------------------------------------------------------------------------------  Urinalysis    Component Value Date/Time   COLORURINE YELLOW 12/05/2011 1528   APPEARANCEUR HAZY (A) 12/05/2011 1528   LABSPEC 1.011 12/05/2011 1528   PHURINE 7.5 12/05/2011 1528   GLUCOSEU NEGATIVE 12/05/2011 1528   HGBUR LARGE (A) 12/05/2011 1528    BILIRUBINUR NEGATIVE 12/05/2011 1528   KETONESUR NEGATIVE 12/05/2011 1528   PROTEINUR 100 (A) 12/05/2011 1528   UROBILINOGEN 0.2 12/05/2011 1528   NITRITE NEGATIVE 12/05/2011 1528   LEUKOCYTESUR TRACE (A) 12/05/2011 1528    ----------------------------------------------------------------------------------------------------------------   Imaging Results:    Dg Pelvis 1-2 Views  Result Date: 04/04/2018 CLINICAL DATA:  83 y/o M; fall from wheelchair with right hip and upper leg pain. EXAM: RIGHT FEMUR 2 VIEWS; PELVIS - 1-2 VIEW COMPARISON:  None. FINDINGS: Right femur: Acute comminuted intratrochanteric fracture of the right femur with mild displacement of fracture components and coxa vara angulation. The femoral head is well seated within the acetabulum. No additional fracture of the femur identified. The knee joint is grossly maintained. Vascular calcifications noted. Pelvis: No acute pelvic fracture or diastasis. Hip joints are well maintained. Degenerative changes of the lower lumbar spine. Vascular calcifications noted. IMPRESSION: Acute comminuted intratrochanteric fracture of the right femur with mild displacement of fracture components and coxa vara angulation. Electronically Signed   By: Kristine Garbe M.D.   On: 04/04/2018 04:23   Dg Chest Port 1 View  Result Date: 04/04/2018 CLINICAL DATA:  Preop.  Fall out of wheelchair with hip fracture. EXAM: PORTABLE CHEST 1 VIEW COMPARISON:  Radiograph 01/03/2018 FINDINGS: Cardiomegaly is unchanged. Minimal vascular congestion without pulmonary edema in a. small right pleural effusion on thoracic spine CT not well seen radiographically. No focal airspace disease or pneumothorax. No acute osseous abnormalities are seen. IMPRESSION: Cardiomegaly with vascular congestion.  No pulmonary edema. Electronically Signed   By: Keith Rake M.D.   On: 04/04/2018 04:50   Dg Femur Min 2 Views Right  Result Date: 04/04/2018 CLINICAL DATA:  83 y/o  M; fall from wheelchair with right hip and upper leg pain. EXAM: RIGHT FEMUR 2 VIEWS; PELVIS - 1-2 VIEW COMPARISON:  None. FINDINGS: Right femur: Acute comminuted intratrochanteric fracture of the right femur with mild displacement of fracture components and coxa vara angulation. The femoral head is well seated within the acetabulum. No additional fracture of the femur identified. The knee joint is grossly maintained. Vascular calcifications noted. Pelvis: No acute pelvic fracture or diastasis. Hip joints are well maintained. Degenerative changes of the lower lumbar spine. Vascular calcifications noted. IMPRESSION: Acute comminuted intratrochanteric fracture of the right femur with mild displacement of fracture components and coxa vara angulation. Electronically Signed   By: Kristine Garbe M.D.   On: 04/04/2018 04:23    Radiological Exams on Admission: Dg Pelvis 1-2 Views  Result Date: 04/04/2018 CLINICAL DATA:  83 y/o M; fall from wheelchair with right hip and upper leg pain. EXAM: RIGHT FEMUR 2 VIEWS; PELVIS - 1-2 VIEW COMPARISON:  None. FINDINGS: Right femur: Acute comminuted intratrochanteric fracture of the right femur with mild displacement of fracture components and coxa vara angulation. The femoral head is well seated within the acetabulum. No additional fracture of the femur identified. The knee joint is grossly maintained. Vascular calcifications noted. Pelvis: No acute pelvic fracture or diastasis. Hip joints are well maintained. Degenerative changes of the lower lumbar spine. Vascular calcifications noted. IMPRESSION: Acute comminuted intratrochanteric fracture of the right femur with mild displacement of fracture components and coxa vara angulation. Electronically Signed   By: Kristine Garbe M.D.   On: 04/04/2018 04:23   Dg Chest Port 1 View  Result Date: 04/04/2018 CLINICAL DATA:  Preop.  Fall out of wheelchair with hip fracture. EXAM: PORTABLE CHEST 1 VIEW COMPARISON:   Radiograph 01/03/2018 FINDINGS: Cardiomegaly is unchanged. Minimal vascular congestion without pulmonary edema in a. small right pleural effusion on thoracic spine CT not well seen radiographically. No focal airspace disease or pneumothorax. No acute osseous abnormalities are seen. IMPRESSION: Cardiomegaly with vascular congestion.  No pulmonary edema. Electronically Signed   By: Keith Rake M.D.   On: 04/04/2018 04:50   Dg Femur Min  2 Views Right  Result Date: 04/04/2018 CLINICAL DATA:  83 y/o M; fall from wheelchair with right hip and upper leg pain. EXAM: RIGHT FEMUR 2 VIEWS; PELVIS - 1-2 VIEW COMPARISON:  None. FINDINGS: Right femur: Acute comminuted intratrochanteric fracture of the right femur with mild displacement of fracture components and coxa vara angulation. The femoral head is well seated within the acetabulum. No additional fracture of the femur identified. The knee joint is grossly maintained. Vascular calcifications noted. Pelvis: No acute pelvic fracture or diastasis. Hip joints are well maintained. Degenerative changes of the lower lumbar spine. Vascular calcifications noted. IMPRESSION: Acute comminuted intratrochanteric fracture of the right femur with mild displacement of fracture components and coxa vara angulation. Electronically Signed   By: Kristine Garbe M.D.   On: 04/04/2018 04:23   DVT Prophylaxis -SCD   AM Labs Ordered, also please review Full Orders  Family Communication: Admission, patients condition and plan of care including tests being ordered have been discussed with the patient and grandson Vicente Males who indicate understanding and agree with the plan   Code Status - DNR Likely DC to SNF  Condition   stable  Roxan Hockey M.D on 04/04/2018 at 5:18 AM Pager---[phone] Go to [email or web address] - password [email or web address] for contact info  [facility] - Office  [phone]

## 2018-04-04 NOTE — Op Note (Signed)
OrthopaedicSurgeryOperativeNote [phone]) Date of Surgery: 04/04/2018  Admit Date: 04/04/2018   Diagnoses: Pre-Op Diagnoses: Right intertrochanteric femur fracture  Post-Op Diagnosis: Same  Procedures: CPT 27245-Cephalomedullary nailing of right intertrochanteric femur fracture  Surgeons: Primary: Shona Needles, MD   Location:[facility] OR ROOM 03   AnesthesiaChoice   Antibiotics:Ancef 2g preop   Tourniquettime:None  BOFBPZWCHENIDPOEUM:35 mL   Complications:None  Specimens:None  Implants: Implant Name Type Inv. Item Serial No. Manufacturer Lot No. LRB No. Used Action  SCREW LOCK STAR [id number] - [id number] Screw SCREW LOCK STAR 5X38  SYNTHES TRAUMA  Right 1 Implanted  tfna helical blade [id number] Screw   Synthes  Right 1 Implanted    IndicationsforSurgery: 83 year old male with dementia and end stage renal disease with right intertrochanteric femur fracture. I recommended to proceed with cephalomedullary nailing of right intertrochanteric femur fracture. Risks discussed included bleeding requiring blood transfusion, bleeding causing a hematoma, infection, malunion, nonunion, damage to surrounding nerves and blood vessels, pain, hardware prominence or irritation, hardware failure, stiffness, post-traumatic arthritis, DVT/PE, compartment syndrome, and even death. Consent was obtained.  Operative Findings: Cephalomedullary nailing of right intertrochanteric femur fracture using Synthes 96mm short TFN  Procedure: The patient was identified in the preoperative holding area. Consent was confirmed with the patient and their family and all questions were answered. The operative extremity was marked after confirmation with the patient and they were then brought back to the operating room by our anesthesia colleagues. The patient was placed under general anesthesia and then carefully transferred over to a Hana table. The feet were secured into a traction boot and well padded. A  post was placed in the groin and traction was pulled on the operative leg. The contralateral leg was positioned out of the way of fluoroscopy and secure . Fluoroscopic images were obtained and traction and manipulation was performed to reduce the fracture. Once adequate reduction was performed then the operative extremity was prepped and draped in sterile fashion. Preincision timeout was performed to verify the patient, the procedure and the extremity. Preoperative antibiotics were dosed.  A small incision was made proximal to the greater trochanter. A curved Mayo scissors was used to spread down to the greater trochanter in line with the abductor musculature. A threaded guidepin was positioned at an appropriate starting point on the AP and lateral views. It was advanced in the femur past the lesser trochanter. A entry reamer with soft tissue protector was then used to enter the canal. A radiographic ruler was used to judge the size of the canal of the femur and a 10 mm short nail was placed into the canal and seated down to an appropriate position radiographically. The targeting arm for the helical blade was attached. A percutaneous incision was made for the guide for the helical blade. A threaded guidepin was placed into the femoral neck and head and fluoroscopy was used to confirm adequate placement with an acceptable tip-apex distance. A drill was used to perforate the lateral cortex and 95 mm helical blade was inserted into the head/neck segment. The set screw was then tightened to set rotation and backed off to allow compression.  The aiming arm was removed from the nail and position of the helical blade was confirmed with fluoroscopy. Using the targeting arm a distal interlock was placed bicortically in the shaft.  Final fluoroscopic images were obtained and the incisions were copiously irrigated. The skin was closed with 2-0 vicryl, 3-0 monocryl and sealed with dermabond. The incisions were dressing  with  Mepilex dressings. The patient was carefully transferred to the regular floor bed and was taken to PACU in stable condition.  Post Op Plan/Instructions: Patient will be weightbearing as tolerated. Recommend aspirin 325 mg daily for DVT prophylaxis. Ancef for postoperatively prophylaxis. Plan for dialysis per nephrology.  I was present and performed the entire surgery.  Katha Hamming, MD [facility]

## 2018-04-04 NOTE — Progress Notes (Signed)
HD tx initiated via 15Gx2 w/o problem, pull/push/flush well w/o problem, VSS, although primary RN gave pt his Metoprolol prior to HD tx despite pre HD tx order set stating to hold those meds, will cont to monitor while on HD tx,

## 2018-04-04 NOTE — Anesthesia Preprocedure Evaluation (Signed)
Anesthesia Evaluation  Patient identified by MRN, date of birth, ID band Patient awake    Reviewed: Allergy & Precautions, NPO status , Patient's Chart, lab work & pertinent test results  Airway Mallampati: II  TM Distance: >3 FB Neck ROM: Full    Dental  (+) Edentulous Upper, Edentulous Lower   Pulmonary former smoker,    breath sounds clear to auscultation       Cardiovascular hypertension,  Rhythm:Regular Rate:Normal     Neuro/Psych    GI/Hepatic   Endo/Other  diabetes  Renal/GU      Musculoskeletal   Abdominal   Peds  Hematology   Anesthesia Other Findings   Reproductive/Obstetrics                             Anesthesia Physical Anesthesia Plan  ASA: III  Anesthesia Plan: General   Post-op Pain Management:    Induction: Intravenous  PONV Risk Score and Plan: Ondansetron  Airway Management Planned: Oral ETT  Additional Equipment:   Intra-op Plan:   Post-operative Plan: Extubation in OR  Informed Consent: I have reviewed the patients History and Physical, chart, labs and discussed the procedure including the risks, benefits and alternatives for the proposed anesthesia with the patient or authorized representative who has indicated his/her understanding and acceptance.     Plan Discussed with: CRNA and Anesthesiologist  Anesthesia Plan Comments: (ESRD last HD 04/02/18 K-4.1 Moderate dementia H/O Renal Cell Ca S/P R. nehrectomy Chronic suprapubic catheter  Plan GA with oral ETT  Roberts Gaudy)        Anesthesia Quick Evaluation

## 2018-04-04 NOTE — Transfer of Care (Signed)
Immediate Anesthesia Transfer of Care Note  Patient: [name] [name]  Procedure(s) Performed: INTRAMEDULLARY (IM) NAIL FEMORAL (Right Hip)  Patient Location: PACU  Anesthesia Type:General  Level of Consciousness: awake, patient cooperative and responds to stimulation  Airway & Oxygen Therapy: Patient Spontanous Breathing and Patient connected to face mask oxygen  Post-op Assessment: Report given to RN, Post -op Vital signs reviewed and stable and Patient moving all extremities X 4  Post vital signs: Reviewed and stable  Last Vitals:  Vitals Value Taken Time  BP 171/78 04/04/2018  3:11 PM  Temp    Pulse 68 04/04/2018  3:13 PM  Resp 26 04/04/2018  3:13 PM  SpO2 96 % 04/04/2018  3:13 PM  Vitals shown include unvalidated device data.  Last Pain:  Vitals:   04/04/18 0805  TempSrc: Oral  PainSc:          Complications: No apparent anesthesia complications

## 2018-04-04 NOTE — ED Triage Notes (Signed)
Pt was brought in by [facility] for c/o fall out of wheelchair; pt was given ativan 1 mg at 2am for agitation; pt has been vomiting all day; pt c/o right hip and upper leg pain; pt given 4mg  zofran IV en route by ems: cbg 134

## 2018-04-04 NOTE — Consult Note (Signed)
Reason for Consult:Right hip fx Referring Physician: P Ferdinand [name] is an 83 y.o. male.  HPI: [name] [name] out of his WC at the SNF where he resides. He had right hip pain and was taken to [facility]. X-rays there showed a right intertroch hip fx and he was transferred to [facility] for further care. He c/o localized pain in the area. He is NWB BLE at baseline. He is also quite demented.  Past Medical History:  Diagnosis Date  . Ankle fracture    Left ~08/2011. [place] fracture  . Arthritis   . Depression   . DM (diabetes mellitus) ([place])    Per pt, borderline   . ESRD on dialysis ([place])    Left fistula   . Headache(784.0)   . HTN (hypertension)   . Leaky heart valve    Hx: of  . Melanoma ([place])   . Mitral regurgitation    Moderate/severe, echo, 01/2013  . Neuropathy   . Rash    over entire body  . Renal cell cancer ([place])    Remote. S/[name] right nephrectomy.     Past Surgical History:  Procedure Laterality Date  . COLONOSCOPY W/ BIOPSIES AND POLYPECTOMY     Hx: of  . EXTERNAL FIXATION LEG  12/02/2011   Procedure: EXTERNAL FIXATION LEG;  Surgeon: Hessie Dibble, MD;  Location: [facility];  Service: Orthopedics;  Laterality: Left;  . FISTULOGRAM N/A 05/12/2013   Procedure: FISTULOGRAM;  Surgeon: Angelia Mould, MD;  Location: [facility] CATH LAB;  Service: Cardiovascular;  Laterality: N/A;  . HARDWARE REMOVAL  12/02/2011   Procedure: HARDWARE REMOVAL;  Surgeon: Hessie Dibble, MD;  Location: [facility];  Service: Orthopedics;  Laterality: Left;  . HERNIA REPAIR    . I&D EXTREMITY  12/02/2011   Procedure: IRRIGATION AND DEBRIDEMENT EXTREMITY;  Surgeon: Hessie Dibble, MD;  Location: [facility];  Service: Orthopedics;  Laterality: Left;  . ORIF ANKLE FRACTURE  12/07/2011   Procedure: OPEN REDUCTION INTERNAL FIXATION (ORIF) ANKLE FRACTURE;  Surgeon: Rozanna Box, MD;  Location: [place];  Service: Orthopedics;  Laterality: Left;  ORIF left pilon  . PERIPHERAL VASCULAR CATHETERIZATION Left 05/31/2015    Procedure: A/V Shuntogram/Fistulagram;  Surgeon: Conrad Hetland, MD;  Location: [place] CV LAB;  Service: Cardiovascular;  Laterality: Left;  arm  . REVISON OF ARTERIOVENOUS FISTULA Left 05/23/2013   Procedure: REVISON OF ARTERIOVENOUS FISTULA;  Surgeon: Angelia Mould, MD;  Location: [facility] OR;  Service: Vascular;  Laterality: Left;    Family History  Problem Relation Age of Onset  . Pancreatic cancer Father   . Dementia Mother     Social History:  reports that he quit smoking about 47 years ago. His smoking use included cigarettes. He has a 8.00 pack-year smoking history. He has never used smokeless tobacco. He reports that he does not drink alcohol or use drugs.  Allergies:  Allergies  Allergen Reactions  . Doxycycline   . Ivp Dye [Iodinated Diagnostic Agents]     Unknown  . Ofloxacin Nausea And Vomiting  . Oxycodone     Altered mental status  . Sulfa Antibiotics Rash  . Sulfamethoxazole-Trimethoprim Rash    Medications: I have reviewed the patient's current medications.  Results for orders placed or performed during the hospital encounter of 04/04/18 (from the past 48 hour(s))  Basic metabolic panel     Status: Abnormal   Collection Time: 04/04/18  4:42 AM  Result Value Ref Range   Sodium 138  135 - 145 mmol/L   Potassium 4.1 3.5 - 5.1 mmol/L   Chloride 97 (L) 98 - 111 mmol/L   CO2 28 22 - 32 mmol/L   Glucose, Bld 112 (H) 70 - 99 mg/dL   BUN 25 (H) 8 - 23 mg/dL   Creatinine, Ser 6.05 (H) 0.61 - 1.24 mg/dL   Calcium 8.2 (L) 8.9 - 10.3 mg/dL   GFR calc non Af Amer 8 (L) >60 mL/min   GFR calc Af Amer 9 (L) >60 mL/min    Comment: (NOTE) The eGFR has been calculated using the CKD EPI equation. This calculation has not been validated in all clinical situations. eGFR's persistently <60 mL/min signify possible Chronic Kidney Disease.    Anion gap 13 5 - 15    Comment: Performed at [facility], [address] [place], [place] [address]  CBC with Differential      Status: Abnormal   Collection Time: 04/04/18  4:42 AM  Result Value Ref Range   WBC 12.1 (H) 4.0 - 10.5 K/uL   RBC 3.58 (L) 4.22 - 5.81 MIL/uL   Hemoglobin 11.7 (L) 13.0 - 17.0 g/dL   HCT 35.7 (L) 39.0 - 52.0 %   MCV 99.7 78.0 - 100.0 fL   MCH 32.7 26.0 - 34.0 pg   MCHC 32.8 30.0 - 36.0 g/dL   RDW 14.5 11.5 - 15.5 %   Platelets 199 150 - 400 K/uL   Neutrophils Relative % 82 %   Neutro Abs 9.9 (H) 1.7 - 7.7 K/uL   Lymphocytes Relative 8 %   Lymphs Abs 1.0 0.7 - 4.0 K/uL   Monocytes Relative 6 %   Monocytes Absolute 0.8 0.1 - 1.0 K/uL   Eosinophils Relative 4 %   Eosinophils Absolute 0.5 0.0 - 0.7 K/uL   Basophils Relative 0 %   Basophils Absolute 0.1 0.0 - 0.1 K/uL    Comment: Performed at [facility], [address] [place], [place] [address]  Protime-INR     Status: None   Collection Time: 04/04/18  4:42 AM  Result Value Ref Range   Prothrombin Time 14.5 11.4 - 15.2 seconds   INR 1.14     Comment: Performed at [facility], [address], [place], [place] [address]    Dg Pelvis 1-2 Views  Result Date: 04/04/2018 CLINICAL DATA:  83 y/o M; fall from wheelchair with right hip and upper leg pain. EXAM: RIGHT FEMUR 2 VIEWS; PELVIS - 1-2 VIEW COMPARISON:  None. FINDINGS: Right femur: Acute comminuted intratrochanteric fracture of the right femur with mild displacement of fracture components and coxa vara angulation. The femoral head is well seated within the acetabulum. No additional fracture of the femur identified. The knee joint is grossly maintained. Vascular calcifications noted. Pelvis: No acute pelvic fracture or diastasis. Hip joints are well maintained. Degenerative changes of the lower lumbar spine. Vascular calcifications noted. IMPRESSION: Acute comminuted intratrochanteric fracture of the right femur with mild displacement of fracture components and coxa vara angulation. Electronically Signed   By: Kristine Garbe M.D.   On: 04/04/2018 04:23   Dg Chest Port 1  View  Result Date: 04/04/2018 CLINICAL DATA:  Preop.  Fall out of wheelchair with hip fracture. EXAM: PORTABLE CHEST 1 VIEW COMPARISON:  Radiograph 01/03/2018 FINDINGS: Cardiomegaly is unchanged. Minimal vascular congestion without pulmonary edema in a. small right pleural effusion on thoracic spine CT not well seen radiographically. No focal airspace disease or pneumothorax. No acute osseous abnormalities are seen. IMPRESSION: Cardiomegaly with  vascular congestion.  No pulmonary edema. Electronically Signed   By: Keith Rake M.D.   On: 04/04/2018 04:50   Dg Femur Min 2 Views Right  Result Date: 04/04/2018 CLINICAL DATA:  83 y/o M; fall from wheelchair with right hip and upper leg pain. EXAM: RIGHT FEMUR 2 VIEWS; PELVIS - 1-2 VIEW COMPARISON:  None. FINDINGS: Right femur: Acute comminuted intratrochanteric fracture of the right femur with mild displacement of fracture components and coxa vara angulation. The femoral head is well seated within the acetabulum. No additional fracture of the femur identified. The knee joint is grossly maintained. Vascular calcifications noted. Pelvis: No acute pelvic fracture or diastasis. Hip joints are well maintained. Degenerative changes of the lower lumbar spine. Vascular calcifications noted. IMPRESSION: Acute comminuted intratrochanteric fracture of the right femur with mild displacement of fracture components and coxa vara angulation. Electronically Signed   By: Kristine Garbe M.D.   On: 04/04/2018 04:23    Review of Systems  Unable to perform ROS: Dementia  Musculoskeletal: Positive for joint pain (Right hip).   Blood pressure (!) 170/80, pulse 84, temperature 97.8 F (36.6 C), temperature source Oral, height 5' 9"  (1.753 m), weight 97.5 kg, SpO2 98 %. Physical Exam  Constitutional: He appears well-developed and well-nourished. No distress.  HENT:  Head: Normocephalic and atraumatic.  Eyes: Conjunctivae are normal. Right eye exhibits no  discharge. Left eye exhibits no discharge. No scleral icterus.  Neck: Normal range of motion.  Cardiovascular: Normal rate and regular rhythm.  Respiratory: Effort normal. No respiratory distress.  Musculoskeletal:  RLE No traumatic wounds, ecchymosis, or rash  TTP right hip  No knee or ankle effusion  Knee stable to varus/ valgus and anterior/posterior stress  Sens DPN, SPN, TN grossly intact  Motor EHL, ext, flex, evers grossly intact  DP 1+, PT 1+, 2+ edema  Neurological: He is alert.  Skin: Skin is warm and dry. He is not diaphoretic.  Psychiatric: He has a normal mood and affect. His behavior is normal.    Assessment/Plan: Right hip fx -- Plan IMN either today or tomorrow by Dr. Doreatha Martin. He is due for HD today but tells me he doesn't want to have it done and wants to have the pain in his hip fixed. Please keep NPO for now. Attending MD to assess and guide timing of surgery. ESRD on HD HTN Dementia    Lisette Abu, PA-C Orthopedic Surgery [phone] 04/04/2018, 8:23 AM

## 2018-04-04 NOTE — ED Notes (Signed)
[name] [name] [phone]

## 2018-04-04 NOTE — ED Provider Notes (Signed)
[facility] Provider Note   CSN: [id number] Arrival date & time: 04/04/18  0259     History   Chief Complaint Chief Complaint  Patient presents with  . Emesis    HPI [name] [name] is a 83 y.o. male.  Patient is an 83 year old male with past medical history of diabetes, end-stage renal disease, and dementia.  He is brought from his nursing home for evaluation of fall.  Apparently he fell out of the wheelchair onto the floor.  They were concerned he was having pain in his right hip.  Patient adds little additional history secondary to baseline mental status.  The history is provided by the EMS personnel.    Past Medical History:  Diagnosis Date  . Ankle fracture    Left ~08/2011. [place] fracture  . Arthritis   . Depression   . DM (diabetes mellitus) ([place])    Per pt, borderline   . ESRD on dialysis ([place])    Left fistula   . Headache(784.0)   . HTN (hypertension)   . Leaky heart valve    Hx: of  . Melanoma ([place])   . Mitral regurgitation    Moderate/severe, echo, 01/2013  . Neuropathy   . Rash    over entire body  . Renal cell cancer ([place])    Remote. S/p right nephrectomy.     Patient Active Problem List   Diagnosis Date Noted  . Mitral regurgitation   . Sinus bradycardia 02/20/2013  . Ventricular ectopy 02/20/2013  . Exertional dyspnea 02/20/2013  . Cellulitis of left ankle 01/03/2012  . Anemia in CKD (chronic kidney disease) 01/03/2012  . Hyperparathyroidism due to renal insufficiency ([place]) 12/08/2011  . Closed pilon fracture of left tibia 12/08/2011  . Syndesmotic disruption of ankle, left 12/08/2011  . Oral thrush 12/08/2011  . Neuropathy 12/03/2011  . Fibula fracture 12/02/2011  . DM (diabetes mellitus) ([place])   . ESRD on dialysis [facility])     Past Surgical History:  Procedure Laterality Date  . COLONOSCOPY W/ BIOPSIES AND POLYPECTOMY     Hx: of  . EXTERNAL FIXATION LEG  12/02/2011   Procedure: EXTERNAL FIXATION LEG;  Surgeon:  Hessie Dibble, MD;  Location: [place];  Service: Orthopedics;  Laterality: Left;  . FISTULOGRAM N/A 05/12/2013   Procedure: FISTULOGRAM;  Surgeon: Angelia Mould, MD;  Location: [facility] CATH LAB;  Service: Cardiovascular;  Laterality: N/A;  . HARDWARE REMOVAL  12/02/2011   Procedure: HARDWARE REMOVAL;  Surgeon: Hessie Dibble, MD;  Location: [facility];  Service: Orthopedics;  Laterality: Left;  . HERNIA REPAIR    . I&D EXTREMITY  12/02/2011   Procedure: IRRIGATION AND DEBRIDEMENT EXTREMITY;  Surgeon: Hessie Dibble, MD;  Location: [place];  Service: Orthopedics;  Laterality: Left;  . ORIF ANKLE FRACTURE  12/07/2011   Procedure: OPEN REDUCTION INTERNAL FIXATION (ORIF) ANKLE FRACTURE;  Surgeon: Rozanna Box, MD;  Location: [place];  Service: Orthopedics;  Laterality: Left;  ORIF left pilon  . PERIPHERAL VASCULAR CATHETERIZATION Left 05/31/2015   Procedure: A/V Shuntogram/Fistulagram;  Surgeon: Conrad [place], MD;  Location: [place] CV LAB;  Service: Cardiovascular;  Laterality: Left;  arm  . REVISON OF ARTERIOVENOUS FISTULA Left 05/23/2013   Procedure: REVISON OF ARTERIOVENOUS FISTULA;  Surgeon: Angelia Mould, MD;  [facility];  Service: Vascular;  Laterality: Left;        Home Medications    Prior to Admission medications   Medication Sig Start Date End Date  Taking? Authorizing Provider  acetaminophen (TYLENOL) 500 MG tablet Take 500 mg by mouth every 6 (six) hours as needed for moderate pain.     [provider]  allopurinol (ZYLOPRIM) 100 MG tablet Take 200 mg by mouth daily.     [provider]  amLODipine (NORVASC) 5 MG tablet Take 1 tablet (5 mg total) by mouth daily. Patient not taking: Reported on 05/26/2015 06/02/13   Herminio Commons, MD  calcium carbonate (TUMS - DOSED IN MG ELEMENTAL CALCIUM) 500 MG chewable tablet Chew 2 tablets by mouth 4 (four) times daily -  with meals and at bedtime.    [provider]  docusate sodium (COLACE) 100 MG  capsule Take 100 mg by mouth daily as needed for constipation.    [provider]  gabapentin (NEURONTIN) 300 MG capsule Take 300-900 mg by mouth at bedtime as needed (pain).     [provider]  isosorbide mononitrate (IMDUR) 60 MG 24 hr tablet Take 1 tablet (60 mg total) by mouth daily. Patient not taking: Reported on 05/26/2015 06/02/13   Herminio Commons, MD  metoprolol succinate (TOPROL-XL) 25 MG 24 hr tablet Take 25 mg by mouth daily.    [provider]  mirtazapine (REMERON) 15 MG tablet Take 15 mg by mouth at bedtime.    [provider]  multivitamin (RENA-VIT) TABS tablet Take 1 tablet by mouth daily.    [provider]  omeprazole (PRILOSEC) 20 MG capsule Take 20 mg by mouth 2 (two) times daily before a meal.    [provider]  PARoxetine (PAXIL) 30 MG tablet Take 30 mg by mouth every morning.    [provider]  QUEtiapine (SEROQUEL) 25 MG tablet Take 25 mg by mouth at bedtime.    [provider]  tamsulosin (FLOMAX) 0.4 MG CAPS capsule Take 0.4 mg by mouth daily.    [provider]    Family History Family History  Problem Relation Age of Onset  . Pancreatic cancer Father   . Dementia Mother     Social History Social History   Tobacco Use  . Smoking status: Former Smoker    Packs/day: 0.80    Years: 10.00    Pack years: 8.00    Types: Cigarettes    Last attempt to quit: 07/03/1970    Years since quitting: 47.7  . Smokeless tobacco: Never Used  Substance Use Topics  . Alcohol use: No  . Drug use: No     Allergies   Doxycycline; Ivp dye [iodinated diagnostic agents]; Ofloxacin; Oxycodone; Sulfa antibiotics; and Sulfamethoxazole-trimethoprim   Review of Systems Review of Systems  Unable to perform ROS: Dementia     Physical Exam Updated Vital Signs Ht 5\' 9"  (1.753 m)   Wt 97.5 kg   BMI 31.74 kg/m   Physical Exam  Constitutional: He appears well-developed and  well-nourished. No distress.  HENT:  Head: Normocephalic and atraumatic.  Mouth/Throat: Oropharynx is clear and moist.  Neck: Normal range of motion. Neck supple.  Cardiovascular: Normal rate and regular rhythm. Exam reveals no friction rub.  No murmur heard. Pulmonary/Chest: Effort normal and breath sounds normal. No respiratory distress. He has no wheezes. He has no rales.  Abdominal: Soft. Bowel sounds are normal. He exhibits no distension. There is no tenderness.  Musculoskeletal: Normal range of motion. He exhibits no edema.  Neurological: He is alert.  Patient is awake and alert.  He appears confused and uncooperative.  Skin: Skin is warm  and dry. He is not diaphoretic.  Nursing note and vitals reviewed.    ED Treatments / Results  Labs (all labs ordered are listed, but only abnormal results are displayed) Labs Reviewed - No data to display  EKG None  Radiology No results found.  Procedures Procedures (including critical care time)  Medications Ordered in ED Medications - No data to display   Initial Impression / Assessment and Plan / ED Course  I have reviewed the triage vital signs and the nursing notes.  Pertinent labs & imaging results that were available during my care of the patient were reviewed by me and considered in my medical decision making (see chart for details).  Patient brought from his extended care facility after a fall.  He is complaining of pain in the right hip.  This shows an acute comminuted intertrochanteric fracture of the right femur with mild displacement.  As there is no orthopedic coverage at this facility, care has been discussed with Dr. Doreatha Martin from orthopedics who would like the patient transferred to [facility] for surgical repair.  He would like the patient admitted to the hospitalist service.  I spoken with Dr. Denton Brick who agrees to admit/transfer.  Final Clinical Impressions(s) / ED Diagnoses   Final diagnoses:  Fall, initial encounter      ED Discharge Orders    None       Veryl Speak, MD 04/04/18 [phone]

## 2018-04-05 ENCOUNTER — Encounter (HOSPITAL_COMMUNITY): Payer: Self-pay | Admitting: General Practice

## 2018-04-05 DIAGNOSIS — D631 Anemia in chronic kidney disease: Secondary | ICD-10-CM

## 2018-04-05 DIAGNOSIS — Z8659 Personal history of other mental and behavioral disorders: Secondary | ICD-10-CM

## 2018-04-05 LAB — BASIC METABOLIC PANEL
Anion gap: 13 (ref 5–15)
BUN: 16 mg/dL (ref 8–23)
CO2: 30 mmol/L (ref 22–32)
CREATININE: 4.61 mg/dL — AB (ref 0.61–1.24)
Calcium: 7.8 mg/dL — ABNORMAL LOW (ref 8.9–10.3)
Chloride: 96 mmol/L — ABNORMAL LOW (ref 98–111)
GFR calc non Af Amer: 11 mL/min — ABNORMAL LOW (ref >60)
GFR, EST AFRICAN AMERICAN: 12 mL/min — AB (ref >60)
GLUCOSE: 96 mg/dL (ref 70–99)
Potassium: 4 mmol/L (ref 3.5–5.1)
Sodium: 139 mmol/L (ref 135–145)

## 2018-04-05 LAB — CBC
HCT: 27.1 % — ABNORMAL LOW (ref 39.0–52.0)
Hemoglobin: 8.9 g/dL — ABNORMAL LOW (ref 13.0–17.0)
MCH: 32.6 pg (ref 26.0–34.0)
MCHC: 32.8 g/dL (ref 30.0–36.0)
MCV: 99.3 fL (ref 78.0–100.0)
PLATELETS: 143 10*3/uL — AB (ref 150–400)
RBC: 2.73 MIL/uL — AB (ref 4.22–5.81)
RDW: 13.2 % (ref 11.5–15.5)
WBC: 10.8 10*3/uL — ABNORMAL HIGH (ref 4.0–10.5)

## 2018-04-05 LAB — MRSA PCR SCREENING: MRSA by PCR: NEGATIVE

## 2018-04-05 MED ORDER — METOPROLOL SUCCINATE ER 25 MG PO TB24
25.0000 mg | ORAL_TABLET | Freq: Every day | ORAL | Status: DC
  Administered 2018-04-05 – 2018-04-09 (×5): 25 mg via ORAL
  Filled 2018-04-05 (×5): qty 1

## 2018-04-05 MED ORDER — ISOSORBIDE MONONITRATE ER 30 MG PO TB24
15.0000 mg | ORAL_TABLET | Freq: Every day | ORAL | Status: DC
  Administered 2018-04-05 – 2018-04-09 (×5): 15 mg via ORAL
  Filled 2018-04-05 (×5): qty 1

## 2018-04-05 MED ORDER — HEPARIN SODIUM (PORCINE) 5000 UNIT/ML IJ SOLN
5000.0000 [IU] | Freq: Three times a day (TID) | INTRAMUSCULAR | Status: DC
  Administered 2018-04-06: 5000 [IU] via SUBCUTANEOUS
  Filled 2018-04-05: qty 1

## 2018-04-05 MED ORDER — CHLORHEXIDINE GLUCONATE CLOTH 2 % EX PADS
6.0000 | MEDICATED_PAD | Freq: Every day | CUTANEOUS | Status: DC
  Administered 2018-04-06 – 2018-04-07 (×2): 6 via TOPICAL

## 2018-04-05 MED FILL — Fentanyl Citrate Preservative Free (PF) Inj 100 MCG/2ML: INTRAMUSCULAR | Qty: 2 | Status: AC

## 2018-04-05 NOTE — Evaluation (Signed)
Occupational Therapy Evaluation Patient Details Name: [name] [name] MRN: [id number] DOB: 16-Aug-1934 Today's Date: 04/05/2018    History of Present Illness 83 y.o. male admitted on 04/04/18 for R intertrochanteric femur fx.  Pt underwent cephalomeduallry nailing of R hip on 04/04/18.  Pt is WBAT R leg post op.  Pt with other significant PMH of ESRD, neuropathy, mitral regurgitation, HTN, DM, L ankle fx s/p ORIF, and dementia with behavioral disturbance living at [facility].  Per chart he was WC bound.    Clinical Impression   PTA Pt wheelchair bound and required assist for ADL and was wc bound. Today, aside from holding a cup and drinking from a straw Pt was total A for all ADL and repeated "I just want to go to sleep, help me go to sleep" over and over. Pt was able to maintain sitting EOB with max A for very short period of time with no change in request. Pt calmed momentarily by Clarisa Fling music. Pt will continue benefit from skilled OT in the acute setting and afterwards after return to SNF.    Follow Up Recommendations  SNF;Supervision/Assistance - 24 hour(return to [place])    Equipment Recommendations  Other (comment)(defer to next venue of care)    Recommendations for Other Services       Precautions / Restrictions Precautions Precautions: Fall Restrictions Weight Bearing Restrictions: Yes RLE Weight Bearing: Weight bearing as tolerated      Mobility Bed Mobility Overal bed mobility: Needs Assistance Bed Mobility: Supine to Sit;Sit to Supine     Supine to sit: +2 for physical assistance;Total assist Sit to supine: +2 for physical assistance;Total assist   General bed mobility comments: Two person total assist to transition to EOB with HOB raised ~30 degrees.  Two people at trunk and one person at legs.  Pt not assisting with transition, but also not resisting as anticipated.    Transfers                 General transfer comment: NT this  session    Balance Overall balance assessment: Needs assistance Sitting-balance support: Bilateral upper extremity supported;No upper extremity supported;Feet unsupported Sitting balance-Leahy Scale: Zero Sitting balance - Comments: max assist EOB at trunk, pt not resisting, but kept repeating that he was in pain and wanted to go to sleep, so did not stay up long and transferred back to bed, repositoned for comfort.                                    ADL either performed or assessed with clinical judgement   ADL Overall ADL's : Needs assistance/impaired Eating/Feeding: Moderate assistance;Bed level                                     General ADL Comments: Pt able to bring cup to mouth with assist for straw, overall total A for ADL due to decreased cognition at this time     Vision         Perception     Praxis      Pertinent Vitals/Pain Pain Assessment: Faces Faces Pain Scale: Hurts whole lot Pain Location: right leg Pain Descriptors / Indicators: Guarding;Grimacing Pain Intervention(s): Limited activity within patient's tolerance;Monitored during session;Repositioned;Patient requesting pain meds-RN notified     Hand Dominance Right   Extremity/Trunk  Assessment Upper Extremity Assessment Upper Extremity Assessment: Overall WFL for tasks assessed   Lower Extremity Assessment Lower Extremity Assessment: Defer to PT evaluation RLE Deficits / Details: bil LE guarded and unable to preform ROM or assess strength.  He is spontaneously moving both legs and both arms, but no to command,  He was flexed at nearly 80 degrees at his hips seated EOB and ~70 degrees flexion at knees seated EOB.  Pt is able to localize that his right leg hurts, but has no concept of why.  LLE Deficits / Details: bil LE guarded and unable to preform ROM or assess strength.  He is spontaneously moving both legs and both arms, but no to command,  He was flexed at nearly 80  degrees at his hips seated EOB and ~70 degrees flexion at knees seated EOB.     Cervical / Trunk Assessment Cervical / Trunk Assessment: Normal   Communication Communication Communication: No difficulties   Cognition Arousal/Alertness: Lethargic Behavior During Therapy: Restless Overall Cognitive Status: History of cognitive impairments - at baseline                                 General Comments: Pt with history of dementia with behavior disturbance.  No one present to report if his current presentation is worse than baseline.    General Comments  Pt's favorite music is Clarisa Fling    Exercises     Shoulder Instructions      Home Living Family/patient expects to be discharged to:: Skilled nursing facility([facility])                                        Prior Functioning/Environment Level of Independence: Needs assistance        Comments: Pt unable to report assist needed prior to admission        OT Problem List: Decreased range of motion;Decreased activity tolerance;Impaired balance (sitting and/or standing);Decreased cognition;Decreased safety awareness;Pain      OT Treatment/Interventions: Self-care/ADL training;DME and/or AE instruction;Therapeutic activities;Patient/family education;Balance training    OT Goals(Current goals can be found in the care plan section) Acute Rehab OT Goals Patient Stated Goal: to go to sleep OT Goal Formulation: With patient Time For Goal Achievement: 04/19/18 Potential to Achieve Goals: Fair ADL Goals Pt Will Perform Grooming: with set-up;sitting Pt Will Perform Upper Body Dressing: with mod assist;sitting Pt Will Perform Lower Body Dressing: with mod assist;bed level Pt Will Transfer to Toilet: with mod assist;with +2 assist;bedside commode Pt Will Perform Toileting - Clothing Manipulation and hygiene: with mod assist Additional ADL Goal #1: Pt will perform bed mobility at mod A prior to  engaging in ADL activity  OT Frequency: Min 2X/week   Barriers to D/C:            Co-evaluation PT/OT/SLP Co-Evaluation/Treatment: Yes Reason for Co-Treatment: Complexity of the patient's impairments (multi-system involvement);Necessary to address cognition/behavior during functional activity;For patient/therapist safety;To address functional/ADL transfers PT goals addressed during session: Mobility/safety with mobility;Balance OT goals addressed during session: ADL's and self-care;Strengthening/ROM      AM-PAC PT "6 Clicks" Daily Activity     Outcome Measure Help from another person eating meals?: A Lot Help from another person taking care of personal grooming?: Total Help from another person toileting, which includes using toliet, bedpan, or urinal?: Total Help from another  person bathing (including washing, rinsing, drying)?: Total Help from another person to put on and taking off regular upper body clothing?: Total Help from another person to put on and taking off regular lower body clothing?: Total 6 Click Score: 7   End of Session Nurse Communication: Mobility status;Patient requests pain meds;Precautions  Activity Tolerance: Patient limited by pain;Treatment limited secondary to agitation Patient left: in bed;with call bell/phone within reach;with bed alarm set;Other (comment)(All 4 rails up)  OT Visit Diagnosis: Unsteadiness on feet (R26.81);Other abnormalities of gait and mobility (R26.89);History of falling (Z91.81);Other symptoms and signs involving cognitive function;Pain Pain - Right/Left: Right Pain - part of body: Leg                Time: 3013-1438 OT Time Calculation (min): 27 min Charges:  OT General Charges $OT Visit: 1 Visit OT Evaluation $OT Eval Moderate Complexity: [place] OTR/L Acute Rehabilitation Services Pager: [phone] Office: [place] 04/05/2018, 5:11 PM

## 2018-04-05 NOTE — Progress Notes (Signed)
CSW received consult due to patient reportedly being from [facility]. CSW attempted to meet with patient, however patient was not oriented. CSW attempted to reach family members spouse and patient's son, however no answer. CSW lvm with patient's spouse, pending return call to ensure patient will want to return to [facility] when medically able to discharge.   [place], [place]

## 2018-04-05 NOTE — Progress Notes (Signed)
Orthopaedic Trauma Progress Note  S: Patient combative last night. Sleeping this AM.  O:  Vitals:   04/05/18 0025 04/05/18 0605  BP: (!) 149/55 126/76  Pulse: (!) 41 70  Resp: 20 16  Temp: 97.9 F (36.6 C) 99.3 F (37.4 C)  SpO2: 100% 100%    Gen: Sleeping Rolled on left side. R side dressings in place currently. Did not perform neurologic exam  Imaging: Stable postop imagin  Labs:  Results for orders placed or performed during the hospital encounter of 04/04/18 (from the past 24 hour(s))  Glucose, capillary     Status: Abnormal   Collection Time: 04/04/18  3:23 PM  Result Value Ref Range   Glucose-Capillary 116 (H) 70 - 99 mg/dL   Comment 1 Notify RN    Comment 2 Document in Chart   Hepatitis B surface antigen     Status: None   Collection Time: 04/04/18  7:20 PM  Result Value Ref Range   Hepatitis B Surface Ag Negative Negative  Basic metabolic panel     Status: Abnormal   Collection Time: 04/05/18  6:47 AM  Result Value Ref Range   Sodium 139 135 - 145 mmol/L   Potassium 4.0 3.5 - 5.1 mmol/L   Chloride 96 (L) 98 - 111 mmol/L   CO2 30 22 - 32 mmol/L   Glucose, Bld 96 70 - 99 mg/dL   BUN 16 8 - 23 mg/dL   Creatinine, Ser 4.61 (H) 0.61 - 1.24 mg/dL   Calcium 7.8 (L) 8.9 - 10.3 mg/dL   GFR calc non Af Amer 11 (L) >60 mL/min   GFR calc Af Amer 12 (L) >60 mL/min   Anion gap 13 5 - 15  CBC     Status: Abnormal   Collection Time: 04/05/18  6:47 AM  Result Value Ref Range   WBC 10.8 (H) 4.0 - 10.5 K/uL   RBC 2.73 (L) 4.22 - 5.81 MIL/uL   Hemoglobin 8.9 (L) 13.0 - 17.0 g/dL   HCT 27.1 (L) 39.0 - 52.0 %   MCV 99.3 78.0 - 100.0 fL   MCH 32.6 26.0 - 34.0 pg   MCHC 32.8 30.0 - 36.0 g/dL   RDW 13.2 11.5 - 15.5 %   Platelets 143 (L) 150 - 400 K/uL    Assessment: 83 year old on dialysis s/p fall  Injuries: Right intertrochanteric femur fracture s/p fixation   Weightbearing: WBAT RLE  Insicional and dressing care: Dressing until POD2  Orthopedic device(s):None  needed  CV/Blood loss:Hgb 8.9 this AM from 11.7, acute blood loss anemia  Pain management: Tramadol PRN for pain  VTE prophylaxis: Aspirin 325 mg  ID: Ancef 2 gm for 24 hours  Foley/Lines: Suprapubic catheter per primary team (apparently pulled out last night)  Medical co-morbidities: ESRD-dialysis  Dispo: PT/OT eval, likely SNF  Follow - up plan: 2 weeks   Shona Needles, MD [facility] [phone] (phone)

## 2018-04-05 NOTE — Progress Notes (Signed)
[facility] Progress Note  Subjective: agitated last night during HD and afterwards.  Better today.   Vitals:   04/04/18 2145 04/04/18 2203 04/05/18 0025 04/05/18 0605  BP: (!) 116/59 129/61 (!) 149/55 126/76  Pulse: 78 75 (!) 41 70  Resp: 19 16 20 16   Temp:  98.8 F (37.1 C) 97.9 F (36.6 C) 99.3 F (37.4 C)  TempSrc:  Oral Oral Oral  SpO2: 97% 95% 100% 100%  Weight:  68.4 kg    Height:        Inpatient medications: . allopurinol  200 mg Oral Daily  . amLODipine  5 mg Oral Daily  . aspirin  325 mg Oral Daily  . calcium carbonate  2 tablet Oral TID WC & HS  . Chlorhexidine Gluconate Cloth  6 each Topical Q0600  . isosorbide mononitrate  15 mg Oral Daily  . metoprolol succinate  25 mg Oral Daily  . mirtazapine  15 mg Oral QHS  . multivitamin  1 tablet Oral Daily  . pantoprazole  40 mg Oral Daily  . PARoxetine  30 mg Oral BH-q7a  . QUEtiapine  25 mg Oral QHS  . sodium chloride flush  3 mL Intravenous Q12H  . tamsulosin  0.4 mg Oral Daily   . sodium chloride    . sodium chloride    . sodium chloride 10 mL/hr at 04/04/18 1312  .  ceFAZolin (ANCEF) IV 2 g (04/05/18 0605)   sodium chloride, sodium chloride, acetaminophen **OR** acetaminophen, albuterol, fentaNYL (SUBLIMAZE) injection, gabapentin, hydrALAZINE, lidocaine-prilocaine, LORazepam, ondansetron **OR** ondansetron (ZOFRAN) IV, pentafluoroprop-tetrafluoroeth, polyethylene glycol, sodium chloride flush, traMADol, traZODone  Iron/TIBC/Ferritin/ %Sat No results found for: IRON, TIBC, FERRITIN, IRONPCTSAT  Exam: Gen eyes closed, answers simple questions, not oriented No jvd or bruits Chest dec'd air movement bilat, no wheezing or rales, no ^wob RRR no MRG Abd soft ntnd no mass or ascites +bs GU normal male, +SP cath draining yellow urine MS no joint effusions or deformity Ext 1+ L pretib edema Neuro - confused, pleasant, nonfocal    Home meds:  - amlodipine 5/ metoprolol xl 25 qd/ isosorbide  mononitrate 60  - allopruinol 200/ omeprazole 20/ tamsulosin 0.4 qd  - quetiapine 25 hs/ paroxetine 30 am/ mirtazapine 15 hs  - gabapentin 300-900 qhs prn  - colace/ vitamins  CXR - vasc congestion, no overt edema  Dialysis: TTS [facility]  3.5h   68kg    Hep 1500 + 300/hr   LFA AVF   - IV vanc 1250mg  q HD thru 10/10 (for UTI dx'd at [place])   Impression/ Plan:  1. Fall/ R hip fracture - sp ORIF on 04/04/18 2. ESRD on HD [facility]. Had HD last night after hip surgery. Plan HD tomorrow.  3. Volume - at dry wt, no vol excess on exam, resp status stable on RA 4. Dementia - significant severity 1. HTN - stable BP's on norvasc/ metoprolol Alden Server MD [facility] pager [phone]   04/05/2018, 12:06 PM   Recent Labs  Lab 04/04/18 0442 04/05/18 0647  NA 138 139  K 4.1 4.0  CL 97* 96*  CO2 28 30  GLUCOSE 112* 96  BUN 25* 16  CREATININE 6.05* 4.61*  CALCIUM 8.2* 7.8*  INR 1.14  --    No results for input(s): AST, ALT, ALKPHOS, BILITOT, PROT in the last 168 hours. Recent Labs  Lab 04/04/18 0442 04/05/18 0647  WBC 12.1* 10.8*  NEUTROABS 9.9*  --  HGB 11.7* 8.9*  HCT 35.7* 27.1*  MCV 99.7 99.3  PLT 199 143*

## 2018-04-05 NOTE — Anesthesia Postprocedure Evaluation (Signed)
Anesthesia Post Note  Patient: [name] [name]  Procedure(s) Performed: INTRAMEDULLARY (IM) NAIL FEMORAL (Right Hip)     Patient location during evaluation: PACU Anesthesia Type: General Level of consciousness: awake and alert Pain management: pain level controlled Vital Signs Assessment: post-procedure vital signs reviewed and stable Respiratory status: spontaneous breathing, nonlabored ventilation, respiratory function stable and patient connected to nasal cannula oxygen Cardiovascular status: blood pressure returned to baseline and stable Postop Assessment: no apparent nausea or vomiting Anesthetic complications: no    Last Vitals:  Vitals:   04/05/18 0025 04/05/18 0605  BP: (!) 149/55 126/76  Pulse: (!) 41 70  Resp: 20 16  Temp: 36.6 C 37.4 C  SpO2: 100% 100%    Last Pain:  Vitals:   04/05/18 0605  TempSrc: Oral  PainSc:                  [place] S

## 2018-04-05 NOTE — Evaluation (Signed)
Physical Therapy Evaluation Patient Details Name: [name] [name] MRN: [id number] DOB: [birth year] Today's Date: 04/05/2018   History of Present Illness  83 y.o. male admitted on 04/04/18 for R intertrochanteric femur fx.  Pt underwent cephalomeduallry nailing of R hip on 04/04/18.  Pt is WBAT R leg post op.  Pt with other significant PMH of ESRD, neuropathy, mitral regurgitation, HTN, DM, L ankle fx s/p ORIF, and dementia with behavioral disturbance living at [facility].  Per chart he was WC bound.   Clinical Impression  Pt was able to sit EOB with three person assist briefly before being repositioned back into bed.  He is very restless, but did not resist our attempts at mobilizing (he also did not help either).  He will need SNF level rehab at discharge.   PT to follow acutely for deficits listed below.      Follow Up Recommendations SNF    Equipment Recommendations  Wheelchair (measurements PT);Wheelchair cushion (measurements PT);Hospital bed;Other (comment)(hoyer lift)    Recommendations for Other Services   NA    Precautions / Restrictions Precautions Precautions: Fall Restrictions Weight Bearing Restrictions: Yes RLE Weight Bearing: Weight bearing as tolerated      Mobility  Bed Mobility Overal bed mobility: Needs Assistance Bed Mobility: Supine to Sit;Sit to Supine     Supine to sit: +2 for physical assistance;Total assist Sit to supine: +2 for physical assistance;Total assist   General bed mobility comments: Two person total assist to transition to EOB with HOB raised ~30 degrees.  Two people at trunk and one person at legs.  Pt not assisting with transition, but also not resisting as anticipated.           Balance Overall balance assessment: Needs assistance Sitting-balance support: Bilateral upper extremity supported;No upper extremity supported;Feet unsupported Sitting balance-Leahy Scale: Zero Sitting balance - Comments: max assist EOB at trunk, pt not  resisting, but kept repeating that he was in pain and wanted to go to sleep, so did not stay up long and transferred back to bed, repositoned for comfort.                                      Pertinent Vitals/Pain Pain Assessment: Faces Faces Pain Scale: Hurts whole lot Pain Location: right leg Pain Descriptors / Indicators: Guarding;Grimacing Pain Intervention(s): Limited activity within patient's tolerance;Monitored during session;Repositioned    Home Living Family/patient expects to be discharged to:: Skilled nursing facility([facility])                      Prior Function Level of Independence: Needs assistance         Comments: Pt unable to report assist needed prior to admission, but was at [facility] and admission notes report WC bound     Hand Dominance   Dominant Hand: Right    Extremity/Trunk Assessment   Upper Extremity Assessment Upper Extremity Assessment: Defer to OT evaluation    Lower Extremity Assessment Lower Extremity Assessment: RLE deficits/detail;LLE deficits/detail RLE Deficits / Details: bil LE guarded and unable to preform ROM or assess strength.  He is spontaneously moving both legs and both arms, but no to command,  He was flexed at nearly 80 degrees at his hips seated EOB and ~70 degrees flexion at knees seated EOB.  Pt is able to localize that his right leg hurts, but has no concept of why.  LLE Deficits / Details: bil LE guarded and unable to preform ROM or assess strength.  He is spontaneously moving both legs and both arms, but no to command,  He was flexed at nearly 80 degrees at his hips seated EOB and ~70 degrees flexion at knees seated EOB.      Cervical / Trunk Assessment Cervical / Trunk Assessment: Normal  Communication   Communication: No difficulties  Cognition Arousal/Alertness: Lethargic Behavior During Therapy: Restless Overall Cognitive Status: History of cognitive impairments - at baseline                                  General Comments: Pt with history of dementia with behvior disturbance.  No one present to report if his current presentation is worse than baseline.              Assessment/Plan    PT Assessment Patient needs continued PT services  PT Problem List Decreased strength;Decreased range of motion;Decreased activity tolerance;Decreased balance;Decreased mobility;Decreased cognition;Decreased knowledge of use of DME;Decreased safety awareness;Pain       PT Treatment Interventions DME instruction;Functional mobility training;Therapeutic activities;Therapeutic exercise;Balance training;Cognitive remediation;Patient/family education;Manual techniques;Modalities    PT Goals (Current goals can be found in the Care Plan section)  Acute Rehab PT Goals Patient Stated Goal: to go to sleep PT Goal Formulation: Patient unable to participate in goal setting Time For Goal Achievement: 04/19/18 Potential to Achieve Goals: Fair    Frequency Min 3X/week        Co-evaluation PT/OT/SLP Co-Evaluation/Treatment: Yes Reason for Co-Treatment: Complexity of the patient's impairments (multi-system involvement);For patient/therapist safety;Necessary to address cognition/behavior during functional activity;To address functional/ADL transfers PT goals addressed during session: Mobility/safety with mobility;Balance;Strengthening/ROM OT goals addressed during session: ADL's and self-care       AM-PAC PT "6 Clicks" Daily Activity  Outcome Measure Difficulty turning over in bed (including adjusting bedclothes, sheets and blankets)?: Unable Difficulty moving from lying on back to sitting on the side of the bed? : Unable Difficulty sitting down on and standing up from a chair with arms (e.g., wheelchair, bedside commode, etc,.)?: Unable Help needed moving to and from a bed to chair (including a wheelchair)?: Total Help needed walking in hospital room?: Total Help needed climbing  3-5 steps with a railing? : Total 6 Click Score: 6    End of Session Equipment Utilized During Treatment: Oxygen(re-applied at end of session as pt was at 91% on RA) Activity Tolerance: Other (comment)(limited by cognitive status) Patient left: in bed;with call bell/phone within reach;with bed alarm set Nurse Communication: Patient requests pain meds PT Visit Diagnosis: Muscle weakness (generalized) (M62.81);Difficulty in walking, not elsewhere classified (R26.2);Pain Pain - Right/Left: Right Pain - part of body: Hip    Time: 4782-9562 PT Time Calculation (min) (ACUTE ONLY): 27 min   Charges:          Wells Guiles B. Juliocesar Blasius, PT, DPT  Acute Rehabilitation #[phone] pager #[phone] office   PT Evaluation $PT Eval Moderate Complexity: 1 Mod         04/05/2018, 4:34 PM

## 2018-04-05 NOTE — Progress Notes (Signed)
PROGRESS NOTE    [name] [name]  TDD:[id number] DOB: Mar 28, 1935 DOA: 04/04/2018 PCP: Deloria Lair., MD  Brief Narrative: [name] is an 83 year old male with history of dementia, ESRD on hemodialysis Tuesday Thursday Saturday, resident of [facility], wheelchair-bound at baseline presented to the ER after mechanical fall with right comminuted intertrochanteric femur fracture. -Orthopedics consulted, ORIF 10/3, severe agitation post op  Assessment & Plan:   -Right hip intertrochanteric femur fracture  -Orthopedics Dr. Wilber Bihari, underwent  ORIF 10/3 -continue peri op BB -ASA for DVT proph recommended by ortho -PT/OT eval -post op with severe agitation and blood loss anemia  End-stage renal disease on hemodialysis Tuesday Thursday Saturday -s/p HD last night -Volume status is stable -Renal following  Advanced dementia with behavioral disturbance -Continue Seroquel and Remeron nightly -needs Palliative FU at SNF  Hypertension -Uncontrolled, continue imdur, amlodipine,  Toprol-XL  Social/Ethics--- patient is from [facility], is a DNR/DNI  DVT prophylaxis: Start heparin SQ tomorrow Code Status: DNR Family Communication: No family at bedside, unsuccesful attempt to contact grandson Disposition Plan: Back to SNF when stable  Consultants:   Ortho  Renal   Procedures:   Antimicrobials:    Subjective: -severe agitation last night, stopped HD early  Objective: Vitals:   04/04/18 2145 04/04/18 2203 04/05/18 0025 04/05/18 0605  BP: (!) 116/59 129/61 (!) 149/55 126/76  Pulse: 78 75 (!) 41 70  Resp: 19 16 20 16   Temp:  98.8 F (37.1 C) 97.9 F (36.6 C) 99.3 F (37.4 C)  TempSrc:  Oral Oral Oral  SpO2: 97% 95% 100% 100%  Weight:  68.4 kg    Height:        Intake/Output Summary (Last 24 hours) at 04/05/2018 1440 Last data filed at 04/05/2018 0605 Gross per 24 hour  Intake 278.92 ml  Output 600 ml  Net -321.08 ml   Filed  Weights   04/04/18 0752 04/04/18 1835 04/04/18 2203  Weight: 68.8 kg 68.9 kg 68.4 kg    Examination:  Gen: elderly male, fast asleep, arouses  HEENT: PERRLA Lungs: decreased Bs at bases CVS: RRR,No Gallops,Rubs or new Murmurs Abd: soft, Non tender, non distended, BS present Extremities: R hip with dressing Skin: no new rashes Psych: confused   Data Reviewed:   CBC: Recent Labs  Lab 04/04/18 0442 04/05/18 0647  WBC 12.1* 10.8*  NEUTROABS 9.9*  --   HGB 11.7* 8.9*  HCT 35.7* 27.1*  MCV 99.7 99.3  PLT 199 623*   Basic Metabolic Panel: Recent Labs  Lab 04/04/18 0442 04/05/18 0647  NA 138 139  K 4.1 4.0  CL 97* 96*  CO2 28 30  GLUCOSE 112* 96  BUN 25* 16  CREATININE 6.05* 4.61*  CALCIUM 8.2* 7.8*   GFR: Estimated Creatinine Clearance: 12.2 mL/min (A) (by C-G formula based on SCr of 4.61 mg/dL (H)). Liver Function Tests: No results for input(s): AST, ALT, ALKPHOS, BILITOT, PROT, ALBUMIN in the last 168 hours. No results for input(s): LIPASE, AMYLASE in the last 168 hours. No results for input(s): AMMONIA in the last 168 hours. Coagulation Profile: Recent Labs  Lab 04/04/18 0442  INR 1.14   Cardiac Enzymes: No results for input(s): CKTOTAL, CKMB, CKMBINDEX, TROPONINI in the last 168 hours. BNP (last 3 results) No results for input(s): PROBNP in the last 8760 hours. HbA1C: No results for input(s): HGBA1C in the last 72 hours. CBG: Recent Labs  Lab 04/04/18 1523  GLUCAP 116*   Lipid Profile: No  results for input(s): CHOL, HDL, LDLCALC, TRIG, CHOLHDL, LDLDIRECT in the last 72 hours. Thyroid Function Tests: No results for input(s): TSH, T4TOTAL, FREET4, T3FREE, THYROIDAB in the last 72 hours. Anemia Panel: No results for input(s): VITAMINB12, FOLATE, FERRITIN, TIBC, IRON, RETICCTPCT in the last 72 hours. Urine analysis:    Component Value Date/Time   COLORURINE YELLOW 12/05/2011 1528   APPEARANCEUR HAZY (A) 12/05/2011 1528   LABSPEC 1.011  12/05/2011 1528   PHURINE 7.5 12/05/2011 1528   GLUCOSEU NEGATIVE 12/05/2011 1528   HGBUR LARGE (A) 12/05/2011 1528   BILIRUBINUR NEGATIVE 12/05/2011 1528   KETONESUR NEGATIVE 12/05/2011 1528   PROTEINUR 100 (A) 12/05/2011 1528   UROBILINOGEN 0.2 12/05/2011 1528   NITRITE NEGATIVE 12/05/2011 1528   LEUKOCYTESUR TRACE (A) 12/05/2011 1528   Sepsis Labs: @LABRCNTIP (procalcitonin:4,lacticidven:4)  )No results found for this or any previous visit (from the past 240 hour(s)).       Radiology Studies: Dg Pelvis 1-2 Views  Result Date: 04/04/2018 CLINICAL DATA:  83 y/o M; fall from wheelchair with right hip and upper leg pain. EXAM: RIGHT FEMUR 2 VIEWS; PELVIS - 1-2 VIEW COMPARISON:  None. FINDINGS: Right femur: Acute comminuted intratrochanteric fracture of the right femur with mild displacement of fracture components and coxa vara angulation. The femoral head is well seated within the acetabulum. No additional fracture of the femur identified. The knee joint is grossly maintained. Vascular calcifications noted. Pelvis: No acute pelvic fracture or diastasis. Hip joints are well maintained. Degenerative changes of the lower lumbar spine. Vascular calcifications noted. IMPRESSION: Acute comminuted intratrochanteric fracture of the right femur with mild displacement of fracture components and coxa vara angulation. Electronically Signed   By: Kristine Garbe M.D.   On: 04/04/2018 04:23   Dg Pelvis Portable  Result Date: 04/04/2018 CLINICAL DATA:  ORIF RIGHT femur fracture. EXAM: PORTABLE PELVIS 1-2 VIEWS COMPARISON:  04/04/2018 FINDINGS: Intramedullary nail/screw within the proximal RIGHT femur traverses an intertrochanteric femur fracture with 5 mm LATERAL displacement. No dislocation. IMPRESSION: ORIF RIGHT femur fracture. Electronically Signed   By: Margarette Canada M.D.   On: 04/04/2018 15:42   Dg Chest Port 1 View  Result Date: 04/04/2018 CLINICAL DATA:  Preop.  Fall out of wheelchair  with hip fracture. EXAM: PORTABLE CHEST 1 VIEW COMPARISON:  Radiograph 01/03/2018 FINDINGS: Cardiomegaly is unchanged. Minimal vascular congestion without pulmonary edema in a. small right pleural effusion on thoracic spine CT not well seen radiographically. No focal airspace disease or pneumothorax. No acute osseous abnormalities are seen. IMPRESSION: Cardiomegaly with vascular congestion.  No pulmonary edema. Electronically Signed   By: Keith Rake M.D.   On: 04/04/2018 04:50   Dg C-arm 1-60 Min  Result Date: 04/04/2018 CLINICAL DATA:  Fluoro spot series demonstrating ORIF for an intertrochanteric fracture. EXAM: OPERATIVE right HIP (WITH PELVIS IF PERFORMED) 5 VIEWS TECHNIQUE: Fluoroscopic spot image(s) were submitted for interpretation post-operatively. COMPARISON:  AP view of the pelvis of April 04, 2018 FINDINGS: The patient has a known mildly displaced intertrochanteric fracture of the right hip. The fluoro spot images reveal ORIF with placement of Zickel nail. The concluding images reveal alignment to be near anatomic. IMPRESSION: No immediate complication following ORIF for an intertrochanteric fracture of the right hip. Electronically Signed   By: David  Martinique M.D.   On: 04/04/2018 14:46   Dg Hip Operative Unilat W Or W/o Pelvis Right  Result Date: 04/04/2018 CLINICAL DATA:  Fluoro spot series demonstrating ORIF for an intertrochanteric fracture. EXAM: OPERATIVE right HIP (WITH PELVIS  IF PERFORMED) 5 VIEWS TECHNIQUE: Fluoroscopic spot image(s) were submitted for interpretation post-operatively. COMPARISON:  AP view of the pelvis of April 04, 2018 FINDINGS: The patient has a known mildly displaced intertrochanteric fracture of the right hip. The fluoro spot images reveal ORIF with placement of Zickel nail. The concluding images reveal alignment to be near anatomic. IMPRESSION: No immediate complication following ORIF for an intertrochanteric fracture of the right hip. Electronically Signed    By: David  Martinique M.D.   On: 04/04/2018 14:46   Dg Femur Min 2 Views Right  Result Date: 04/04/2018 CLINICAL DATA:  83 y/o M; fall from wheelchair with right hip and upper leg pain. EXAM: RIGHT FEMUR 2 VIEWS; PELVIS - 1-2 VIEW COMPARISON:  None. FINDINGS: Right femur: Acute comminuted intratrochanteric fracture of the right femur with mild displacement of fracture components and coxa vara angulation. The femoral head is well seated within the acetabulum. No additional fracture of the femur identified. The knee joint is grossly maintained. Vascular calcifications noted. Pelvis: No acute pelvic fracture or diastasis. Hip joints are well maintained. Degenerative changes of the lower lumbar spine. Vascular calcifications noted. IMPRESSION: Acute comminuted intratrochanteric fracture of the right femur with mild displacement of fracture components and coxa vara angulation. Electronically Signed   By: Kristine Garbe M.D.   On: 04/04/2018 04:23        Scheduled Meds: . allopurinol  200 mg Oral Daily  . amLODipine  5 mg Oral Daily  . aspirin  325 mg Oral Daily  . calcium carbonate  2 tablet Oral TID WC & HS  . Chlorhexidine Gluconate Cloth  6 each Topical Q0600  . [START ON 04/06/2018] Chlorhexidine Gluconate Cloth  6 each Topical Q0600  . isosorbide mononitrate  15 mg Oral Daily  . metoprolol succinate  25 mg Oral Daily  . mirtazapine  15 mg Oral QHS  . multivitamin  1 tablet Oral Daily  . pantoprazole  40 mg Oral Daily  . PARoxetine  30 mg Oral BH-q7a  . QUEtiapine  25 mg Oral QHS  . sodium chloride flush  3 mL Intravenous Q12H  . tamsulosin  0.4 mg Oral Daily   Continuous Infusions: . sodium chloride    . sodium chloride    . sodium chloride 10 mL/hr at 04/04/18 1312  .  ceFAZolin (ANCEF) IV 2 g (04/05/18 1436)     LOS: 1 day    Time spent: 11min    Domenic Polite, MD [facility] Page via [email or web address], password [email or web address] After 7PM please contact  night-coverage  04/05/2018, 2:40 PM

## 2018-04-05 NOTE — Progress Notes (Addendum)
Pt. Became very aggressive and combative when he arrived back from dialysis, refused all his medication for 2200( see MAR),  pulled his peripheral IV and  Suprapubic catheter out, remove the new surgical dressing out 5 time after replacing it each time he removes it, on call doctor paged, got an order of ativan 1mg  IM, and 1:1 sitter at bedside. Ativan given, still waiting to get a sitter from staffing, patient closer to the nursing station  ,patient relaxing right now, put a new order for IV.  Will continue to monitor every 10 to 15 minutes.Marland Kitchen

## 2018-04-06 LAB — CBC
HCT: 23 % — ABNORMAL LOW (ref 39.0–52.0)
HCT: 23.8 % — ABNORMAL LOW (ref 39.0–52.0)
HEMOGLOBIN: 7.8 g/dL — AB (ref 13.0–17.0)
Hemoglobin: 7.6 g/dL — ABNORMAL LOW (ref 13.0–17.0)
MCH: 32.8 pg (ref 26.0–34.0)
MCH: 32.5 pg (ref 26.0–34.0)
MCHC: 33 g/dL (ref 30.0–36.0)
MCHC: 32.8 g/dL (ref 30.0–36.0)
MCV: 99.1 fL (ref 78.0–100.0)
MCV: 99.2 fL (ref 78.0–100.0)
PLATELETS: 149 10*3/uL — AB (ref 150–400)
Platelets: 140 10*3/uL — ABNORMAL LOW (ref 150–400)
RBC: 2.32 MIL/uL — AB (ref 4.22–5.81)
RBC: 2.4 MIL/uL — AB (ref 4.22–5.81)
RDW: 13.2 % (ref 11.5–15.5)
RDW: 13.2 % (ref 11.5–15.5)
WBC: 8.4 10*3/uL (ref 4.0–10.5)
WBC: 9 10*3/uL (ref 4.0–10.5)

## 2018-04-06 LAB — BASIC METABOLIC PANEL
ANION GAP: 14 (ref 5–15)
BUN: 27 mg/dL — AB (ref 8–23)
CO2: 28 mmol/L (ref 22–32)
CREATININE: 6.02 mg/dL — AB (ref 0.61–1.24)
Calcium: 8.1 mg/dL — ABNORMAL LOW (ref 8.9–10.3)
Chloride: 96 mmol/L — ABNORMAL LOW (ref 98–111)
GFR calc Af Amer: 9 mL/min — ABNORMAL LOW (ref >60)
GFR calc non Af Amer: 8 mL/min — ABNORMAL LOW (ref >60)
Glucose, Bld: 67 mg/dL — ABNORMAL LOW (ref 70–99)
POTASSIUM: 3.9 mmol/L (ref 3.5–5.1)
SODIUM: 138 mmol/L (ref 135–145)

## 2018-04-06 LAB — ABO/RH: ABO/RH(D): A POS

## 2018-04-06 LAB — PREPARE RBC (CROSSMATCH)

## 2018-04-06 MED ORDER — HALOPERIDOL LACTATE 5 MG/ML IJ SOLN
5.0000 mg | Freq: Once | INTRAMUSCULAR | Status: AC
  Administered 2018-04-06: 5 mg via INTRAVENOUS
  Filled 2018-04-06: qty 1

## 2018-04-06 MED ORDER — SODIUM CHLORIDE 0.9% IV SOLUTION: Freq: Once | INTRAVENOUS | Status: DC

## 2018-04-06 NOTE — Progress Notes (Signed)
PROGRESS NOTE    [name] [name]  VWU:[id number] DOB: [birth year] DOA: 04/04/2018 PCP: Deloria Lair., MD  Brief Narrative: [name] [name] is an 83 year old male with history of dementia, ESRD on hemodialysis Tuesday Thursday Saturday, resident of [facility], wheelchair-bound at baseline presented to the ER after mechanical fall with right comminuted intertrochanteric femur fracture. -Orthopedics consulted, ORIF 10/3, severe agitation post op  Assessment & Plan:   -Right hip intertrochanteric femur fracture  -Orthopedics Dr. Wilber Bihari, underwent  ORIF 10/3 -continue peri op BB -ASA for DVT proph recommended by ortho -PT/OT eval -post op with severe agitation and blood loss anemia  Acute blood loss anemia -Transfuse 1 unit PRBC  End-stage renal disease on hemodialysis Tuesday Thursday Saturday -hemodialysis today -Volume status is stable -Renal following  Advanced dementia with behavioral disturbance -Continue Seroquel and Remeron nightly -needs Palliative FU at SNF  Hypertension -Uncontrolled, continue imdur, amlodipine,  Toprol-XL  Social/Ethics--- patient is from [facility], is a DNR/DNI  DVT prophylaxis: SCDs Code Status: DNR Family Communication: No family at bedside, unsuccesful attempt to contact grandson Disposition Plan: SNF in 1 to 2 days if stable  Consultants:   Ortho  Renal   Procedures:   Antimicrobials:    Subjective: -more pleasant today, less agitated on HD today  Objective: Vitals:   04/06/18 1128 04/06/18 1135 04/06/18 1200 04/06/18 1302  BP: (!) 149/70 (!) 141/68 (!) 146/76 (!) 158/70  Pulse: 72 64 74 75  Resp: 18 19 18 18   Temp: 98.2 F (36.8 C) 98 F (36.7 C) 98.8 F (37.1 C) 98.8 F (37.1 C)  TempSrc: Oral Oral Oral Oral  SpO2: 97% 97% 97% 97%  Weight:   66.5 kg   Height:        Intake/Output Summary (Last 24 hours) at 04/06/2018 1458 Last data filed at 04/06/2018 1300 Gross per 24 hour  Intake  120 ml  Output 1000 ml  Net -880 ml   Filed Weights   04/04/18 1835 04/04/18 2203 04/06/18 1200  Weight: 68.9 kg 68.4 kg 66.5 kg    Examination:  Gen: Awake, Alert, Oriented X  Self only  HEENT: PERRLA, Neck supple, no JVD Lungs: CTAB CVS: S1S2/RRR Abd: soft, Non tender, non distended, BS present Extremities: Left hip with dressing Skin: no new rashes Psych: pleasant but confused   Data Reviewed:   CBC: Recent Labs  Lab 04/04/18 0442 04/05/18 0647 04/06/18 0453 04/06/18 0859  WBC 12.1* 10.8* 8.4 9.0  NEUTROABS 9.9*  --   --   --   HGB 11.7* 8.9* 7.6* 7.8*  HCT 35.7* 27.1* 23.0* 23.8*  MCV 99.7 99.3 99.1 99.2  PLT 199 143* 140* 295*   Basic Metabolic Panel: Recent Labs  Lab 04/04/18 0442 04/05/18 0647 04/06/18 0453  NA 138 139 138  K 4.1 4.0 3.9  CL 97* 96* 96*  CO2 28 30 28   GLUCOSE 112* 96 67*  BUN 25* 16 27*  CREATININE 6.05* 4.61* 6.02*  CALCIUM 8.2* 7.8* 8.1*   GFR: Estimated Creatinine Clearance: 9.1 mL/min (A) (by C-G formula based on SCr of 6.02 mg/dL (H)). Liver Function Tests: No results for input(s): AST, ALT, ALKPHOS, BILITOT, PROT, ALBUMIN in the last 168 hours. No results for input(s): LIPASE, AMYLASE in the last 168 hours. No results for input(s): AMMONIA in the last 168 hours. Coagulation Profile: Recent Labs  Lab 04/04/18 0442  INR 1.14   Cardiac Enzymes: No results for input(s): CKTOTAL, CKMB, CKMBINDEX, TROPONINI in the  last 168 hours. BNP (last 3 results) No results for input(s): PROBNP in the last 8760 hours. HbA1C: No results for input(s): HGBA1C in the last 72 hours. CBG: Recent Labs  Lab 04/04/18 1523  GLUCAP 116*   Lipid Profile: No results for input(s): CHOL, HDL, LDLCALC, TRIG, CHOLHDL, LDLDIRECT in the last 72 hours. Thyroid Function Tests: No results for input(s): TSH, T4TOTAL, FREET4, T3FREE, THYROIDAB in the last 72 hours. Anemia Panel: No results for input(s): VITAMINB12, FOLATE, FERRITIN, TIBC, IRON,  RETICCTPCT in the last 72 hours. Urine analysis:    Component Value Date/Time   COLORURINE YELLOW 12/05/2011 1528   APPEARANCEUR HAZY (A) 12/05/2011 1528   LABSPEC 1.011 12/05/2011 1528   PHURINE 7.5 12/05/2011 1528   GLUCOSEU NEGATIVE 12/05/2011 1528   HGBUR LARGE (A) 12/05/2011 1528   BILIRUBINUR NEGATIVE 12/05/2011 1528   KETONESUR NEGATIVE 12/05/2011 1528   PROTEINUR 100 (A) 12/05/2011 1528   UROBILINOGEN 0.2 12/05/2011 1528   NITRITE NEGATIVE 12/05/2011 1528   LEUKOCYTESUR TRACE (A) 12/05/2011 1528   Sepsis Labs: @LABRCNTIP (procalcitonin:4,lacticidven:4)  ) Recent Results (from the past 240 hour(s))  MRSA PCR Screening     Status: None   Collection Time: 04/05/18  2:27 PM  Result Value Ref Range Status   MRSA by PCR NEGATIVE NEGATIVE Final    Comment:        The GeneXpert MRSA Assay (FDA approved for NASAL specimens only), is one component of a comprehensive MRSA colonization surveillance program. It is not intended to diagnose MRSA infection nor to guide or monitor treatment for MRSA infections. Performed at [facility], [place] [address], [place], [place] [address]          Radiology Studies: Dg Pelvis Portable  Result Date: 04/04/2018 CLINICAL DATA:  ORIF RIGHT femur fracture. EXAM: PORTABLE PELVIS 1-2 VIEWS COMPARISON:  04/04/2018 FINDINGS: Intramedullary nail/screw within the proximal RIGHT femur traverses an intertrochanteric femur fracture with 5 mm LATERAL displacement. No dislocation. IMPRESSION: ORIF RIGHT femur fracture. Electronically Signed   By: Margarette Canada M.D.   On: 04/04/2018 15:42        Scheduled Meds: . sodium chloride   Intravenous Once  . allopurinol  200 mg Oral Daily  . amLODipine  5 mg Oral Daily  . aspirin  325 mg Oral Daily  . calcium carbonate  2 tablet Oral TID WC & HS  . Chlorhexidine Gluconate Cloth  6 each Topical Q0600  . Chlorhexidine Gluconate Cloth  6 each Topical Q0600  . isosorbide mononitrate  15 mg Oral  Daily  . metoprolol succinate  25 mg Oral Daily  . mirtazapine  15 mg Oral QHS  . multivitamin  1 tablet Oral Daily  . pantoprazole  40 mg Oral Daily  . PARoxetine  30 mg Oral BH-q7a  . QUEtiapine  25 mg Oral QHS  . sodium chloride flush  3 mL Intravenous Q12H  . tamsulosin  0.4 mg Oral Daily   Continuous Infusions: . sodium chloride       LOS: 2 days    Time spent: 6min    Domenic Polite, MD [facility] Page via [email or web address], password [email or web address] After 7PM please contact night-coverage  04/06/2018, 2:58 PM

## 2018-04-06 NOTE — Progress Notes (Signed)
[facility] Progress Note  Subjective: much more alert and interactive today  Vitals:   04/06/18 1128 04/06/18 1135 04/06/18 1200 04/06/18 1302  BP: (!) 149/70 (!) 141/68 (!) 146/76 (!) 158/70  Pulse: 72 64 74 75  Resp: 18 19 18 18   Temp: 98.2 F (36.8 C) 98 F (36.7 C) 98.8 F (37.1 C) 98.8 F (37.1 C)  TempSrc: Oral Oral Oral Oral  SpO2: 97% 97% 97% 97%  Weight:   66.5 kg   Height:        Inpatient medications: . sodium chloride   Intravenous Once  . allopurinol  200 mg Oral Daily  . amLODipine  5 mg Oral Daily  . aspirin  325 mg Oral Daily  . calcium carbonate  2 tablet Oral TID WC & HS  . Chlorhexidine Gluconate Cloth  6 each Topical Q0600  . Chlorhexidine Gluconate Cloth  6 each Topical Q0600  . isosorbide mononitrate  15 mg Oral Daily  . metoprolol succinate  25 mg Oral Daily  . mirtazapine  15 mg Oral QHS  . multivitamin  1 tablet Oral Daily  . pantoprazole  40 mg Oral Daily  . PARoxetine  30 mg Oral BH-q7a  . QUEtiapine  25 mg Oral QHS  . sodium chloride flush  3 mL Intravenous Q12H  . tamsulosin  0.4 mg Oral Daily   . sodium chloride    . sodium chloride     sodium chloride, sodium chloride, acetaminophen **OR** acetaminophen, albuterol, fentaNYL (SUBLIMAZE) injection, gabapentin, hydrALAZINE, lidocaine-prilocaine, LORazepam, ondansetron **OR** ondansetron (ZOFRAN) IV, pentafluoroprop-tetrafluoroeth, polyethylene glycol, sodium chloride flush, traMADol, traZODone  Iron/TIBC/Ferritin/ %Sat No results found for: IRON, TIBC, FERRITIN, IRONPCTSAT  Exam: Gen awake and alert No jvd or bruits Chest clear bilat no wheezing or rales RRR no mrg Abd soft ntnd no mass or ascites +bs GU normal male, +SP cath draining yellow urine MS no joint effusions or deformity Ext 1+ L pretib edema Neuro - alert today,  interacting and knows his OP HD sched and current president.     Home meds:  - amlodipine 5/ metoprolol xl 25 qd/ isosorbide mononitrate  60  - allopruinol 200/ omeprazole 20/ tamsulosin 0.4 qd  - quetiapine 25 hs/ paroxetine 30 am/ mirtazapine 15 hs  - gabapentin 300-900 qhs prn  - colace/ vitamins  CXR - vasc congestion, no overt edema  Dialysis: TTS [facility]  3.5h   68kg    Hep 1500 + 300/hr   LFA AVF   - IV vanc 1250mg  q HD thru 10/10 (for UTI dx'd at [place])   Impression/ Plan:  1. Fall/ R hip fracture - sp ORIF on 04/04/18 2. ESRD on HD T[facility] [place]. HD today.  3. Volume - stable, slightly under dry 1. Dementia - MS much better today, interacting.  2. HTN - stable BP's on norvasc/ metoprolol xl 3. Anemia d/t ABL: postop Hb drifted down to 7.8 today , 11.7 preop. May need prbc's if continues to drop.   4. Debility: return to SNF   Kelly Splinter MD [place] pager [phone]   04/06/2018, 1:07 PM   Recent Labs  Lab 04/04/18 0442 04/05/18 0647 04/06/18 0453  NA 138 139 138  K 4.1 4.0 3.9  CL 97* 96* 96*  CO2 28 30 28   GLUCOSE 112* 96 67*  BUN 25* 16 27*  CREATININE 6.05* 4.61* 6.02*  CALCIUM 8.2* 7.8* 8.1*  INR 1.14  --   --  No results for input(s): AST, ALT, ALKPHOS, BILITOT, PROT in the last 168 hours. Recent Labs  Lab 04/04/18 0442  04/06/18 0453 04/06/18 0859  WBC 12.1*   < > 8.4 9.0  NEUTROABS 9.9*  --   --   --   HGB 11.7*   < > 7.6* 7.8*  HCT 35.7*   < > 23.0* 23.8*  MCV 99.7   < > 99.1 99.2  PLT 199   < > 140* 149*   < > = values in this interval not displayed.

## 2018-04-07 LAB — BPAM RBC
Blood Product Expiration Date: 201910082359
ISSUE DATE / TIME: 201910051107
Unit Type and Rh: 6200

## 2018-04-07 LAB — CBC
HCT: 27.6 % — ABNORMAL LOW (ref 39.0–52.0)
Hemoglobin: 9.2 g/dL — ABNORMAL LOW (ref 13.0–17.0)
MCH: 32.1 pg (ref 26.0–34.0)
MCHC: 33.3 g/dL (ref 30.0–36.0)
MCV: 96.2 fL (ref 78.0–100.0)
Platelets: 171 10*3/uL (ref 150–400)
RBC: 2.87 MIL/uL — AB (ref 4.22–5.81)
RDW: 14.3 % (ref 11.5–15.5)
WBC: 7.3 10*3/uL (ref 4.0–10.5)

## 2018-04-07 LAB — BASIC METABOLIC PANEL
ANION GAP: 11 (ref 5–15)
BUN: 21 mg/dL (ref 8–23)
CHLORIDE: 97 mmol/L — AB (ref 98–111)
CO2: 28 mmol/L (ref 22–32)
Calcium: 8 mg/dL — ABNORMAL LOW (ref 8.9–10.3)
Creatinine, Ser: 4.08 mg/dL — ABNORMAL HIGH (ref 0.61–1.24)
GFR calc non Af Amer: 12 mL/min — ABNORMAL LOW (ref >60)
GFR, EST AFRICAN AMERICAN: 14 mL/min — AB (ref >60)
Glucose, Bld: 149 mg/dL — ABNORMAL HIGH (ref 70–99)
POTASSIUM: 4.1 mmol/L (ref 3.5–5.1)
SODIUM: 136 mmol/L (ref 135–145)

## 2018-04-07 LAB — TYPE AND SCREEN
ABO/RH(D): A POS
Antibody Screen: NEGATIVE
Unit division: 0

## 2018-04-07 MED ORDER — ASPIRIN 325 MG PO TABS: 325.0000 mg | ORAL_TABLET | Freq: Every day | ORAL | Status: AC

## 2018-04-07 MED ORDER — HALOPERIDOL LACTATE 5 MG/ML IJ SOLN
2.0000 mg | Freq: Once | INTRAMUSCULAR | Status: AC
  Administered 2018-04-07: 2 mg via INTRAVENOUS
  Filled 2018-04-07: qty 1

## 2018-04-07 MED ORDER — PAROXETINE HCL 10 MG PO TABS: 10.0000 mg | ORAL_TABLET | ORAL | Status: AC

## 2018-04-07 MED ORDER — ISOSORBIDE MONONITRATE ER 30 MG PO TB24: 30.0000 mg | ORAL_TABLET | Freq: Every day | ORAL | Status: AC

## 2018-04-07 MED ORDER — LORAZEPAM 2 MG/ML IJ SOLN
2.0000 mg | Freq: Once | INTRAMUSCULAR | Status: AC
  Administered 2018-04-07: 2 mg via INTRAVENOUS
  Filled 2018-04-07: qty 1

## 2018-04-07 MED ORDER — METOPROLOL SUCCINATE ER 25 MG PO TB24: 25.0000 mg | ORAL_TABLET | Freq: Every day | ORAL | Status: AC

## 2018-04-07 MED ORDER — GABAPENTIN 100 MG PO CAPS: 100.0000 mg | ORAL_CAPSULE | Freq: Every day | ORAL | Status: AC

## 2018-04-07 MED ORDER — TAMSULOSIN HCL 0.4 MG PO CAPS: 0.4000 mg | ORAL_CAPSULE | Freq: Every day | ORAL | Status: AC

## 2018-04-07 MED ORDER — HALOPERIDOL LACTATE 5 MG/ML IJ SOLN
5.0000 mg | Freq: Once | INTRAMUSCULAR | Status: AC
  Administered 2018-04-07: 5 mg via INTRAVENOUS
  Filled 2018-04-07: qty 1

## 2018-04-07 NOTE — Clinical Social Work Note (Addendum)
Pt is not alert and oriented. CSW has attempted to reach all contacts to determine if plan is for pt to return. CSW has been unsuccessful at reaching family. CSW has left voicemail--awaiting call back.   CSW received a call from [facility] (pt's daughter). Confirmed pt is from [facility] and will return. CSW will reach out to facility.   CSW spoke with Larene Beach at [facility]. Pt was at facility under [employer] benefit. Pt will have to have authorization to return. Larene Beach will start auth first thing in the morning.  [place], [place]

## 2018-04-07 NOTE — Progress Notes (Signed)
Requesting MD to call RN. Patient and staff safety at risk.

## 2018-04-07 NOTE — Progress Notes (Signed)
[facility] Progress Note  Subjective: stable voernight, no /c/o  Vitals:   04/06/18 1713 04/06/18 1935 04/07/18 0552 04/07/18 0804  BP: (!) 148/64 (!) 156/71 (!) 156/64 139/71  Pulse: 72 74 72   Resp: 18     Temp: 98.8 F (37.1 C) 98.9 F (37.2 C) 98.7 F (37.1 C)   TempSrc: Oral Oral Oral   SpO2: 97% 97%    Weight:      Height:        Inpatient medications: . sodium chloride   Intravenous Once  . allopurinol  200 mg Oral Daily  . amLODipine  5 mg Oral Daily  . aspirin  325 mg Oral Daily  . calcium carbonate  2 tablet Oral TID WC & HS  . Chlorhexidine Gluconate Cloth  6 each Topical Q0600  . Chlorhexidine Gluconate Cloth  6 each Topical Q0600  . isosorbide mononitrate  15 mg Oral Daily  . metoprolol succinate  25 mg Oral Daily  . mirtazapine  15 mg Oral QHS  . multivitamin  1 tablet Oral Daily  . pantoprazole  40 mg Oral Daily  . PARoxetine  30 mg Oral BH-q7a  . QUEtiapine  25 mg Oral QHS  . sodium chloride flush  3 mL Intravenous Q12H  . tamsulosin  0.4 mg Oral Daily   . sodium chloride     sodium chloride, acetaminophen **OR** acetaminophen, albuterol, gabapentin, hydrALAZINE, LORazepam, ondansetron **OR** ondansetron (ZOFRAN) IV, polyethylene glycol, sodium chloride flush, traMADol, traZODone  Iron/TIBC/Ferritin/ %Sat No results found for: IRON, TIBC, FERRITIN, IRONPCTSAT  Exam: Gen awake and alert No jvd or bruits Chest clear bilat no wheezing or rales RRR no mrg Abd soft ntnd no mass or ascites +bs GU normal male, +SP cath draining yellow urine MS no joint effusions or deformity Ext 1+ L pretib edema Neuro - alert today,  interacting and knows his OP HD sched and current president.     Home meds:  - amlodipine 5/ metoprolol xl 25 qd/ isosorbide mononitrate 60  - allopruinol 200/ omeprazole 20/ tamsulosin 0.4 qd  - quetiapine 25 hs/ paroxetine 30 am/ mirtazapine 15 hs  - gabapentin 300-900 qhs prn  - colace/ vitamins  CXR - vasc  congestion, no overt edema  Dialysis: TTS [facility]  3.5h   68kg    Hep 1500 + 300/hr   LFA AVF   - IV vanc 1250mg  q HD thru 10/10 (for UTI dx'd at [place])   Impression/ Plan:  1. Fall/ R hip fracture - sp ORIF on 04/04/18 2. ESRD  - cont HD TTTS 3. Volume - stable, slightly under dry 1. Dementia - MS stable, up in chair, mostly oriented 2. HTN - stable BP's on norvasc/ metoprolol xl 3. Anemia d/t ABL: postop Hb 11.7 >> 7's, got prbc's on Sat w HD. Hb up today 9's.  4. Debility: return to SNF    Kelly Splinter MD [place] pager [phone]   04/07/2018, 1:08 PM   Recent Labs  Lab 04/04/18 0442  04/06/18 0453 04/07/18 0815  NA 138   < > 138 136  K 4.1   < > 3.9 4.1  CL 97*   < > 96* 97*  CO2 28   < > 28 28  GLUCOSE 112*   < > 67* 149*  BUN 25*   < > 27* 21  CREATININE 6.05*   < > 6.02* 4.08*  CALCIUM 8.2*   < > 8.1* 8.0*  INR  1.14  --   --   --    < > = values in this interval not displayed.   No results for input(s): AST, ALT, ALKPHOS, BILITOT, PROT in the last 168 hours. Recent Labs  Lab 04/04/18 0442  04/06/18 0859 04/07/18 0815  WBC 12.1*   < > 9.0 7.3  NEUTROABS 9.9*  --   --   --   HGB 11.7*   < > 7.8* 9.2*  HCT 35.7*   < > 23.8* 27.6*  MCV 99.7   < > 99.2 96.2  PLT 199   < > 149* 171   < > = values in this interval not displayed.

## 2018-04-07 NOTE — Discharge Summary (Addendum)
Physician Discharge Summary  [name] [name]:[id number] DOB: July 12, 1934 DOA: 04/04/2018  PCP: Deloria Lair., MD  Admit date: 04/04/2018 Discharge date: 04/07/2018  Time spent: 45 minutes  Recommendations for Outpatient Follow-up:  1. PCP in 1 week 2. Ortho Dr.Haddix in 2weeks 3. Palliative care FU at SNF recommended   Discharge Diagnoses:    R hip fracture   Advanced Dementia   Urinary retention, with chronic suprapubic catheter   DM (diabetes mellitus) ([place])   ESRD on dialysis ([place])   Anemia in CKD (chronic kidney disease)   Hip fracture ([place])   Delirium  Discharge Condition: stable  Diet recommendation: Renal diet  Filed Weights   04/04/18 1835 04/04/18 2203 04/06/18 1200  Weight: 68.9 kg 68.4 kg 66.5 kg    History of present illness:  [name] [name] is an 83 year old male with history of dementia, ESRD on hemodialysis Tuesday Thursday Saturday, resident of [facility], chronic suprapubic cath, wheelchair-bound at baseline presented to the ER after mechanical fall with right comminuted intertrochanteric femur fracture.  Hospital Course:   -Right hip intertrochanteric femur fracture  -Orthopedics Dr. Wilber Bihari, underwent  ORIF 10/3 -continue peri op BB -ASA for DVT proph recommended by ortho -PT/OT eval completed, DC back to SNF recommended -Weight bearing as tolerated of RLE -FU with Dr.Haddix in 10-14days  Acute blood loss anemia -Transfused 1 unit PRBC -hb stable post op -gets EPo for anemia of chronic disease  End-stage renal disease on hemodialysis Tuesday Thursday Saturday -s/p hemodialysis yesterday -Volume status is stable  Advanced dementia with behavioral disturbance -Continue Seroquel and Remeron nightly -needs Palliative FU at SNF  Hypertension -Uncontrolled, continue imdur, amlodipine,  Toprol-XL  Chronic Suprapubic catheter -at baseline, Pt try to pull this few days ago, now taped up -FU with Urology, continue routine cath  care  Social/Ethics---patient is from [place] home, is a DNR/DNI  Code Status: DNR  Discharge Exam: Vitals:   04/07/18 0552 04/07/18 0804  BP: (!) 156/64 139/71  Pulse: 72   Resp:    Temp: 98.7 F (37.1 C)   SpO2:      General: AAOx to self only, pleasantly confused Cardiovascular: S1S2/RRR Respiratory: CTAB  Discharge Instructions   Discharge Instructions    Discharge instructions   Complete by:  As directed    Renal Diet   Increase activity slowly   Complete by:  As directed      Allergies as of 04/07/2018      Reactions   Doxycycline    Ivp Dye [iodinated Diagnostic Agents]    Unknown   Ofloxacin Nausea And Vomiting   Oxycodone    Altered mental status   Sulfa Antibiotics Rash   Sulfamethoxazole-trimethoprim Rash      Medication List    STOP taking these medications   hydrOXYzine 25 MG tablet Commonly known as:  ATARAX/VISTARIL   mirtazapine 15 MG tablet Commonly known as:  REMERON     TAKE these medications   acetaminophen 325 MG tablet Commonly known as:  TYLENOL Take 325 mg by mouth every 6 (six) hours as needed for mild pain or moderate pain.   allopurinol 100 MG tablet Commonly known as:  ZYLOPRIM Take 100 mg by mouth 2 (two) times daily.   amLODipine 5 MG tablet Commonly known as:  NORVASC Take 1 tablet (5 mg total) by mouth daily. What changed:  how much to take   aspirin 325 MG tablet Take 1 tablet (325 mg total) by mouth daily. Start taking on:  04/08/2018   gabapentin 100 MG capsule Commonly known as:  NEURONTIN Take 1 capsule (100 mg total) by mouth at bedtime. What changed:    medication strength  how much to take  when to take this  reasons to take this   isosorbide mononitrate 30 MG 24 hr tablet Commonly known as:  IMDUR Take 1 tablet (30 mg total) by mouth daily. What changed:    medication strength  how much to take   Melatonin 3 MG Tabs Take 6 mg by mouth at bedtime as needed for  sleep.   metoprolol succinate 25 MG 24 hr tablet Commonly known as:  TOPROL-XL Take 1 tablet (25 mg total) by mouth daily. Start taking on:  04/08/2018   omeprazole 20 MG capsule Commonly known as:  PRILOSEC Take 20 mg by mouth daily.   PARoxetine 10 MG tablet Commonly known as:  PAXIL Take 1 tablet (10 mg total) by mouth every morning. What changed:    medication strength  how much to take   polyethylene glycol packet Commonly known as:  MIRALAX / GLYCOLAX Take 17 g by mouth daily as needed for constipation.   QUEtiapine 25 MG tablet Commonly known as:  SEROQUEL Take 25 mg by mouth at bedtime.   senna 8.6 MG tablet Commonly known as:  SENOKOT Take 2 tablets by mouth daily as needed for constipation.   tamsulosin 0.4 MG Caps capsule Commonly known as:  FLOMAX Take 1 capsule (0.4 mg total) by mouth daily.   traZODone 50 MG tablet Commonly known as:  DESYREL Take 0.5 mg by mouth at bedtime. (if melatonin given and >30 mins still not sleep      Allergies  Allergen Reactions  . Doxycycline   . Ivp Dye [Iodinated Diagnostic Agents]     Unknown  . Ofloxacin Nausea And Vomiting  . Oxycodone     Altered mental status  . Sulfa Antibiotics Rash  . Sulfamethoxazole-Trimethoprim Rash   Follow-up Information    Deloria Lair., MD. Schedule an appointment as soon as possible for a visit in 1 week(s).   Specialty:  Family Medicine Contact information: [address] [place] [address] [phone]        Shona Needles, MD. Schedule an appointment as soon as possible for a visit in 10 day(s).   Specialty:  Orthopedic Surgery Contact information: [address] [place]  [address] [phone]            The results of significant diagnostics from this hospitalization (including imaging, microbiology, ancillary and laboratory) are listed below for reference.    Significant Diagnostic Studies: Dg Pelvis 1-2 Views  Result Date:  04/04/2018 CLINICAL DATA:  83 y/o M; fall from wheelchair with right hip and upper leg pain. EXAM: RIGHT FEMUR 2 VIEWS; PELVIS - 1-2 VIEW COMPARISON:  None. FINDINGS: Right femur: Acute comminuted intratrochanteric fracture of the right femur with mild displacement of fracture components and coxa vara angulation. The femoral head is well seated within the acetabulum. No additional fracture of the femur identified. The knee joint is grossly maintained. Vascular calcifications noted. Pelvis: No acute pelvic fracture or diastasis. Hip joints are well maintained. Degenerative changes of the lower lumbar spine. Vascular calcifications noted. IMPRESSION: Acute comminuted intratrochanteric fracture of the right femur with mild displacement of fracture components and coxa vara angulation. Electronically Signed   By: Kristine Garbe M.D.   On: 04/04/2018 04:23   Dg Pelvis Portable  Result Date: 04/04/2018  CLINICAL DATA:  ORIF RIGHT femur fracture. EXAM: PORTABLE PELVIS 1-2 VIEWS COMPARISON:  04/04/2018 FINDINGS: Intramedullary nail/screw within the proximal RIGHT femur traverses an intertrochanteric femur fracture with 5 mm LATERAL displacement. No dislocation. IMPRESSION: ORIF RIGHT femur fracture. Electronically Signed   By: Margarette Canada M.D.   On: 04/04/2018 15:42   Dg Chest Port 1 View  Result Date: 04/04/2018 CLINICAL DATA:  Preop.  Fall out of wheelchair with hip fracture. EXAM: PORTABLE CHEST 1 VIEW COMPARISON:  Radiograph 01/03/2018 FINDINGS: Cardiomegaly is unchanged. Minimal vascular congestion without pulmonary edema in a. small right pleural effusion on thoracic spine CT not well seen radiographically. No focal airspace disease or pneumothorax. No acute osseous abnormalities are seen. IMPRESSION: Cardiomegaly with vascular congestion.  No pulmonary edema. Electronically Signed   By: Keith Rake M.D.   On: 04/04/2018 04:50   Dg C-arm 1-60 Min  Result Date: 04/04/2018 CLINICAL DATA:  Fluoro  spot series demonstrating ORIF for an intertrochanteric fracture. EXAM: OPERATIVE right HIP (WITH PELVIS IF PERFORMED) 5 VIEWS TECHNIQUE: Fluoroscopic spot image(s) were submitted for interpretation post-operatively. COMPARISON:  AP view of the pelvis of April 04, 2018 FINDINGS: The patient has a known mildly displaced intertrochanteric fracture of the right hip. The fluoro spot images reveal ORIF with placement of Zickel nail. The concluding images reveal alignment to be near anatomic. IMPRESSION: No immediate complication following ORIF for an intertrochanteric fracture of the right hip. Electronically Signed   By: David  Martinique M.D.   On: 04/04/2018 14:46   Dg Hip Operative Unilat W Or W/o Pelvis Right  Result Date: 04/04/2018 CLINICAL DATA:  Fluoro spot series demonstrating ORIF for an intertrochanteric fracture. EXAM: OPERATIVE right HIP (WITH PELVIS IF PERFORMED) 5 VIEWS TECHNIQUE: Fluoroscopic spot image(s) were submitted for interpretation post-operatively. COMPARISON:  AP view of the pelvis of April 04, 2018 FINDINGS: The patient has a known mildly displaced intertrochanteric fracture of the right hip. The fluoro spot images reveal ORIF with placement of Zickel nail. The concluding images reveal alignment to be near anatomic. IMPRESSION: No immediate complication following ORIF for an intertrochanteric fracture of the right hip. Electronically Signed   By: David  Martinique M.D.   On: 04/04/2018 14:46   Dg Femur Min 2 Views Right  Result Date: 04/04/2018 CLINICAL DATA:  83 y/o M; fall from wheelchair with right hip and upper leg pain. EXAM: RIGHT FEMUR 2 VIEWS; PELVIS - 1-2 VIEW COMPARISON:  None. FINDINGS: Right femur: Acute comminuted intratrochanteric fracture of the right femur with mild displacement of fracture components and coxa vara angulation. The femoral head is well seated within the acetabulum. No additional fracture of the femur identified. The knee joint is grossly maintained. Vascular  calcifications noted. Pelvis: No acute pelvic fracture or diastasis. Hip joints are well maintained. Degenerative changes of the lower lumbar spine. Vascular calcifications noted. IMPRESSION: Acute comminuted intratrochanteric fracture of the right femur with mild displacement of fracture components and coxa vara angulation. Electronically Signed   By: Kristine Garbe M.D.   On: 04/04/2018 04:23    Microbiology: Recent Results (from the past 240 hour(s))  MRSA PCR Screening     Status: None   Collection Time: 04/05/18  2:27 PM  Result Value Ref Range Status   MRSA by PCR NEGATIVE NEGATIVE Final    Comment:        The GeneXpert MRSA Assay (FDA approved for NASAL specimens only), is one component of a comprehensive MRSA colonization surveillance program. It is not intended to  diagnose MRSA infection nor to guide or monitor treatment for MRSA infections. Performed at [facility], [address] [place], [place] [address]      Labs: Basic Metabolic Panel: Recent Labs  Lab 04/04/18 [phone] 04/05/18 0647 04/06/18 0453 04/07/18 0815  NA 138 139 138 136  K 4.1 4.0 3.9 4.1  CL 97* 96* 96* 97*  CO2 28 30 28 28   GLUCOSE 112* 96 67* 149*  BUN 25* 16 27* 21  CREATININE 6.05* 4.61* 6.02* 4.08*  CALCIUM 8.2* 7.8* 8.1* 8.0*   Liver Function Tests: No results for input(s): AST, ALT, ALKPHOS, BILITOT, PROT, ALBUMIN in the last 168 hours. No results for input(s): LIPASE, AMYLASE in the last 168 hours. No results for input(s): AMMONIA in the last 168 hours. CBC: Recent Labs  Lab 04/04/18 0442 04/05/18 0647 04/06/18 0453 04/06/18 0859 04/07/18 0815  WBC 12.1* 10.8* 8.4 9.0 7.3  NEUTROABS 9.9*  --   --   --   --   HGB 11.7* 8.9* 7.6* 7.8* 9.2*  HCT 35.7* 27.1* 23.0* 23.8* 27.6*  MCV 99.7 99.3 99.1 99.2 96.2  PLT 199 143* 140* 149* 171   Cardiac Enzymes: No results for input(s): CKTOTAL, CKMB, CKMBINDEX, TROPONINI in the last 168 hours. BNP: BNP (last 3  results) No results for input(s): BNP in the last 8760 hours.  ProBNP (last 3 results) No results for input(s): PROBNP in the last 8760 hours.  CBG: Recent Labs  Lab 04/04/18 1523  GLUCAP 116*       Signed:  Domenic Polite MD.  [facility] 04/07/2018, 11:02 AM

## 2018-04-07 NOTE — NC FL2 (Signed)
Spray LEVEL OF CARE SCREENING TOOL     IDENTIFICATION  Patient Name: [name] [name] Birthdate: 01-01-1935 Sex: male Admission Date (Current Location): 04/04/2018  [facility] and [place] Number:  Herbalist and Address:  [facility] [place], [place] [address] [place], [place] [address]      Provider Number: [id number]  Attending Physician Name and Address:  Domenic Polite, MD  Relative Name and Phone Number:       Current Level of Care: Hospital Recommended Level of Care: [place] Prior Approval Number:    Date Approved/Denied:   PASRR Number: [id number]  EXPIRES 04/04/18  Discharge Plan: SNF    Current Diagnoses: Patient Active Problem List   Diagnosis Date Noted  . Hip fracture ([place]) 04/04/2018  . Mitral regurgitation   . Sinus bradycardia 02/20/2013  . Ventricular ectopy 02/20/2013  . Exertional dyspnea 02/20/2013  . Cellulitis of left ankle 01/03/2012  . Anemia in CKD (chronic kidney disease) 01/03/2012  . Hyperparathyroidism due to renal insufficiency ([place]) 12/08/2011  . Closed pilon fracture of left tibia 12/08/2011  . Syndesmotic disruption of ankle, left 12/08/2011  . Oral thrush 12/08/2011  . Neuropathy 12/03/2011  . Fibula fracture 12/02/2011  . DM (diabetes mellitus) ([place])   . ESRD on dialysis [facility])     Orientation RESPIRATION BLADDER Height & Weight     [name]  Normal Incontinent(Suprapubic cath 04/04/18) Weight: 146 lb 9.7 oz (66.5 kg) Height:  5\' 9"  (175.3 cm)  BEHAVIORAL SYMPTOMS/MOOD NEUROLOGICAL BOWEL NUTRITION STATUS      Continent Diet(heart healthy, thin liquids)  AMBULATORY STATUS COMMUNICATION OF NEEDS Skin   Total Care Verbally Surgical wounds(Closed incision right hip, mepilex dressing.Closed incision right thigh, foam dressing.  Incision on left leg.)                       Personal Care Assistance Level of Assistance  Bathing, Feeding, Dressing Bathing Assistance: Maximum  assistance Feeding assistance: Limited assistance Dressing Assistance: Maximum assistance     Functional Limitations Info  Sight, Hearing, Speech Sight Info: Adequate Hearing Info: Adequate Speech Info: Adequate    SPECIAL CARE FACTORS FREQUENCY  PT (By licensed PT), OT (By licensed OT)     PT Frequency: 3x OT Frequency: 3x            Contractures Contractures Info: Not present    Additional Factors Info  Code Status, Allergies Code Status Info: DNR Allergies Info: Doxycycline, Ivp Dye Iodinated Diagnostic Agents, Ofloxacin, Oxycodone, Sulfa Antibiotics, Sulfamethoxazole-trimethoprim           Current Medications (04/07/2018):  This is the current hospital active medication list Current Facility-Administered Medications  Medication Dose Route Frequency Provider Last Rate Last Dose  . 0.9 %  sodium chloride infusion (Manually program via Guardrails IV Fluids)   Intravenous Once Domenic Polite, MD      . 0.9 %  sodium chloride infusion  250 mL Intravenous PRN Haddix, Thomasene Lot, MD      . acetaminophen (TYLENOL) tablet 650 mg  650 mg Oral Q6H PRN Haddix, Thomasene Lot, MD       Or  . acetaminophen (TYLENOL) suppository 650 mg  650 mg Rectal Q6H PRN Haddix, Thomasene Lot, MD      . albuterol (PROVENTIL) (2.5 MG/3ML) 0.083% nebulizer solution 2.5 mg  2.5 mg Nebulization Q2H PRN Haddix, Thomasene Lot, MD      . allopurinol (ZYLOPRIM) tablet 200 mg  200 mg  Oral Daily Haddix, Thomasene Lot, MD   200 mg at 04/07/18 0805  . amLODipine (NORVASC) tablet 5 mg  5 mg Oral Daily Haddix, Thomasene Lot, MD   5 mg at 04/07/18 0804  . aspirin tablet 325 mg  325 mg Oral Daily Haddix, Thomasene Lot, MD   325 mg at 04/07/18 0804  . calcium carbonate (TUMS - dosed in mg elemental calcium) chewable tablet 400 mg of elemental calcium  2 tablet Oral TID WC & HS Haddix, Thomasene Lot, MD   400 mg of elemental calcium at 04/07/18 0804  . Chlorhexidine Gluconate Cloth 2 % PADS 6 each  6 each Topical Q0600 Haddix, Thomasene Lot, MD   6 each at  04/07/18 0515  . Chlorhexidine Gluconate Cloth 2 % PADS 6 each  6 each Topical Q0600 Roney Jaffe, MD   6 each at 04/07/18 0515  . gabapentin (NEURONTIN) capsule 300 mg  300 mg Oral QHS PRN Haddix, Thomasene Lot, MD      . hydrALAZINE (APRESOLINE) injection 10 mg  10 mg Intravenous Q6H PRN Haddix, Thomasene Lot, MD      . isosorbide mononitrate (IMDUR) 24 hr tablet 15 mg  15 mg Oral Daily Domenic Polite, MD   15 mg at 04/07/18 0804  . LORazepam (ATIVAN) injection 1 mg  1 mg Intravenous Q6H PRN Haddix, Thomasene Lot, MD   1 mg at 04/06/18 1339  . metoprolol succinate (TOPROL-XL) 24 hr tablet 25 mg  25 mg Oral Daily Domenic Polite, MD   25 mg at 04/07/18 0805  . mirtazapine (REMERON) tablet 15 mg  15 mg Oral QHS Haddix, Thomasene Lot, MD   15 mg at 04/06/18 2149  . multivitamin (RENA-VIT) tablet 1 tablet  1 tablet Oral Daily Haddix, Thomasene Lot, MD   1 tablet at 04/07/18 0804  . ondansetron (ZOFRAN) tablet 4 mg  4 mg Oral Q6H PRN Haddix, Thomasene Lot, MD       Or  . ondansetron (ZOFRAN) injection 4 mg  4 mg Intravenous Q6H PRN Haddix, Thomasene Lot, MD      . pantoprazole (PROTONIX) EC tablet 40 mg  40 mg Oral Daily Haddix, Thomasene Lot, MD   40 mg at 04/07/18 0806  . PARoxetine (PAXIL) tablet 30 mg  30 mg Oral BH-q7a Haddix, Thomasene Lot, MD   30 mg at 04/07/18 0806  . polyethylene glycol (MIRALAX / GLYCOLAX) packet 17 g  17 g Oral Daily PRN Haddix, Thomasene Lot, MD      . QUEtiapine (SEROQUEL) tablet 25 mg  25 mg Oral QHS Haddix, Thomasene Lot, MD   25 mg at 04/06/18 2150  . sodium chloride flush (NS) 0.9 % injection 3 mL  3 mL Intravenous Q12H Haddix, Thomasene Lot, MD   3 mL at 04/07/18 0806  . sodium chloride flush (NS) 0.9 % injection 3 mL  3 mL Intravenous PRN Haddix, Thomasene Lot, MD      . tamsulosin (FLOMAX) capsule 0.4 mg  0.4 mg Oral Daily Haddix, Thomasene Lot, MD   0.4 mg at 04/07/18 0805  . traMADol (ULTRAM) tablet 50 mg  50 mg Oral Q6H PRN Haddix, Thomasene Lot, MD   50 mg at 04/06/18 1338  . traZODone (DESYREL) tablet 50 mg  50 mg Oral QHS PRN Haddix, Thomasene Lot, MD         Discharge Medications: Please see discharge summary for a list of discharge medications.  Relevant Imaging Results:  Relevant Lab Results:   Additional Information SSN:  [id number]  Gerrianne Scale Treyce Spillers, LCSW

## 2018-04-07 NOTE — Progress Notes (Signed)
Patient has active Air cabin crew order. No available safety sitter for the 1900-0700 shift. RN lowered bed with placement of bed alarm at "moves toward edge of bed", placed fall safety mats at both sides of bed, belongings and call button within reach, room door left open, patient's room located at the nurses station, RN and NT will complete scheduled rounds. Patient is high fall risk and disoriented x 3. Reorientation to setting has not been successful. Nursing will continue to monitor throughout night.

## 2018-04-07 NOTE — Progress Notes (Signed)
At shift change RN received report that patient's safety sitter order was cancelled pending discharge on Monday October 7th, 2019. Shortly after that update the RN received notification from the day shift charge RN that the patient would in fact have a safety sitter for the oncoming night shift because the patient was deemed too unsafe during the day shift. Moments later the day shift RN returned and updated RN that the sitter would not be sent because there were no available sitters on staff. RN went to the patient's room to place floor mats on both sides of the bed, lower the bed to the lowest position, bed alarm placed to "moves toward edge of bed" placed belongings and call bell near the patient to ensure the patient's safety. Patient repeatedly attempted to leave the bed at least 6 times per hour from 1900 until 2100. Patient became agitated because he wanted to get up from the bed and he insisted that staff leave the room so that he could do so. RN explained to the patient that this is unsafe and offered to assist the patient back to bed and to bring him what he needed. Patient would no cooperate. RN paged MD requesting medication to help soothe the patient. Patient had active order for Ativan of which had been unsuccessful upon administration over the course of the entire weekend as RN had patient since Friday October 4th, 2019. MD ordered Haldol for a one time order. Haldol proved unsuccessful upon administration as patient even more adamantly moved to the near edge of the foot of the bed and refused to get into bed. Patient's safety was such at risk that RN had to sit inside of the patient's room as a 1:1 Air cabin crew. The patient became agitated that the RN would not leave the room verbally threatening the RN and swinging his fist and would not move back into the bed. RN then called the MD again for assistance. Patient then received another injection of Haldol. Administration of this medication proved  unsuccessful as patient continued to be verbally abusive to the patient, threw water out of a cup, and then threw the cup at the RN, then proceeded to take off the foley cath bag and swing it attempting to hit the RN. After this occurrence RN called the MD back and received orders for wrists and vest restraints and a once time dose of Ativan. RN had assistance of 3 RN's and 2 NT's to safely restrain the patient to the bed and administer Ativan. Restraint protocol currently in place. RN will continue to monitor.

## 2018-04-07 NOTE — Progress Notes (Signed)
Requesting MD to call RN. Patient throwing water and cup at RN. Patient verbally threatening RN.

## 2018-04-08 MED ORDER — CHLORHEXIDINE GLUCONATE CLOTH 2 % EX PADS
6.0000 | MEDICATED_PAD | Freq: Every day | CUTANEOUS | Status: DC
  Administered 2018-04-08: 6 via TOPICAL

## 2018-04-08 NOTE — Progress Notes (Addendum)
PROGRESS NOTE    [name] [name]  OVF:[id number] DOB: 04-Oct-1934 DOA: 04/04/2018 PCP: Deloria Lair., MD  Brief Narrative: [name] [name] is an 83 year old male with history of dementia, ESRD on hemodialysis Tuesday Thursday Saturday, resident of [facility], wheelchair-bound at baseline presented to the ER after mechanical fall with right comminuted intertrochanteric femur fracture. -Orthopedics consulted, ORIF 10/3, severe agitation post op -Discharge delayed due to severe agitation last night  Assessment & Plan:   -Right hip intertrochanteric femur fracture  -Orthopedics Dr. Wilber Bihari, underwent  ORIF 10/3 -continue peri op BB -ASA for DVT proph recommended by ortho -PT/OT eval -Was supposed to discharge to SNF but severe agitation last night requiring restraints and sitter, now off  Acute blood loss anemia -Transfused 1 unit PRBC -hb is stable  End-stage renal disease on hemodialysis Tuesday Thursday Saturday -hemodialysis today -Volume status is stable -Renal following  Advanced dementia with behavioral disturbance -Continue Seroquel and Remeron nightly -needs Palliative FU at SNF  Hypertension -Uncontrolled, continue imdur, amlodipine,  Toprol-XL  Chronic Suprapubic catheter -at baseline -FU with Urology, continue routine cath care  Social/Ethics--- patient is from [place] home, is a DNR/DNI  DVT prophylaxis: SCDs Code Status: DNR Family Communication: No family at bedside, unsuccesful attempt to contact grandson Disposition Plan: SNF tomorrow if remains stable without sitter  Consultants:   Ortho  Renal   Procedures:   Antimicrobials:    Subjective: -Severe agitation last night requiring Ativan and 2 doses of Haldol  Objective: Vitals:   04/08/18 0429 04/08/18 0513 04/08/18 0638 04/08/18 0950  BP: (!) 161/57 (!) 160/48 (!) 161/45 (!) 157/44  Pulse: 64 (!) 57 61 66  Resp:    20  Temp: 98.6 F (37 C) 97.8 F (36.6 C)  98.9 F (37.2 C)   TempSrc: Axillary Axillary Axillary   SpO2:    92%  Weight:      Height:       No intake or output data in the 24 hours ending 04/08/18 1342 Filed Weights   04/04/18 1835 04/04/18 2203 04/06/18 1200  Weight: 68.9 kg 68.4 kg 66.5 kg    Examination:  Gen:, Arousable, oriented to self only, pleasantly confused HEENT: PERRLA, Lungs: Clear bilaterally CVS: RRR,No Gallops,Rubs or new Murmurs Abd: soft, Non tender, non distended, BS present, suprapubic catheter Extremities: No edema Skin: Left hip with dressing   Data Reviewed:   CBC: Recent Labs  Lab 04/04/18 0442 04/05/18 0647 04/06/18 0453 04/06/18 0859 04/07/18 0815  WBC 12.1* 10.8* 8.4 9.0 7.3  NEUTROABS 9.9*  --   --   --   --   HGB 11.7* 8.9* 7.6* 7.8* 9.2*  HCT 35.7* 27.1* 23.0* 23.8* 27.6*  MCV 99.7 99.3 99.1 99.2 96.2  PLT 199 143* 140* 149* 841   Basic Metabolic Panel: Recent Labs  Lab 04/04/18 0442 04/05/18 0647 04/06/18 0453 04/07/18 0815  NA 138 139 138 136  K 4.1 4.0 3.9 4.1  CL 97* 96* 96* 97*  CO2 28 30 28 28   GLUCOSE 112* 96 67* 149*  BUN 25* 16 27* 21  CREATININE 6.05* 4.61* 6.02* 4.08*  CALCIUM 8.2* 7.8* 8.1* 8.0*   GFR: Estimated Creatinine Clearance: 13.4 mL/min (A) (by C-G formula based on SCr of 4.08 mg/dL (H)). Liver Function Tests: No results for input(s): AST, ALT, ALKPHOS, BILITOT, PROT, ALBUMIN in the last 168 hours. No results for input(s): LIPASE, AMYLASE in the last 168 hours. No results for input(s): AMMONIA in the last  168 hours. Coagulation Profile: Recent Labs  Lab 04/04/18 0442  INR 1.14   Cardiac Enzymes: No results for input(s): CKTOTAL, CKMB, CKMBINDEX, TROPONINI in the last 168 hours. BNP (last 3 results) No results for input(s): PROBNP in the last 8760 hours. HbA1C: No results for input(s): HGBA1C in the last 72 hours. CBG: Recent Labs  Lab 04/04/18 1523  GLUCAP 116*   Lipid Profile: No results for input(s): CHOL, HDL, LDLCALC,  TRIG, CHOLHDL, LDLDIRECT in the last 72 hours. Thyroid Function Tests: No results for input(s): TSH, T4TOTAL, FREET4, T3FREE, THYROIDAB in the last 72 hours. Anemia Panel: No results for input(s): VITAMINB12, FOLATE, FERRITIN, TIBC, IRON, RETICCTPCT in the last 72 hours. Urine analysis:    Component Value Date/Time   COLORURINE YELLOW 12/05/2011 1528   APPEARANCEUR HAZY (A) 12/05/2011 1528   LABSPEC 1.011 12/05/2011 1528   PHURINE 7.5 12/05/2011 1528   GLUCOSEU NEGATIVE 12/05/2011 1528   HGBUR LARGE (A) 12/05/2011 1528   BILIRUBINUR NEGATIVE 12/05/2011 1528   KETONESUR NEGATIVE 12/05/2011 1528   PROTEINUR 100 (A) 12/05/2011 1528   UROBILINOGEN 0.2 12/05/2011 1528   NITRITE NEGATIVE 12/05/2011 1528   LEUKOCYTESUR TRACE (A) 12/05/2011 1528   Sepsis Labs: @LABRCNTIP (procalcitonin:4,lacticidven:4)  ) Recent Results (from the past 240 hour(s))  MRSA PCR Screening     Status: None   Collection Time: 04/05/18  2:27 PM  Result Value Ref Range Status   MRSA by PCR NEGATIVE NEGATIVE Final    Comment:        The GeneXpert MRSA Assay (FDA approved for NASAL specimens only), is one component of a comprehensive MRSA colonization surveillance program. It is not intended to diagnose MRSA infection nor to guide or monitor treatment for MRSA infections. Performed at [facility], [place] [address], [place], [place] [address]          Radiology Studies: No results found.      Scheduled Meds: . sodium chloride   Intravenous Once  . allopurinol  200 mg Oral Daily  . amLODipine  5 mg Oral Daily  . aspirin  325 mg Oral Daily  . calcium carbonate  2 tablet Oral TID WC & HS  . Chlorhexidine Gluconate Cloth  6 each Topical Q0600  . Chlorhexidine Gluconate Cloth  6 each Topical Q0600  . Chlorhexidine Gluconate Cloth  6 each Topical Q0600  . isosorbide mononitrate  15 mg Oral Daily  . metoprolol succinate  25 mg Oral Daily  . mirtazapine  15 mg Oral QHS  . multivitamin  1  tablet Oral Daily  . pantoprazole  40 mg Oral Daily  . PARoxetine  30 mg Oral BH-q7a  . QUEtiapine  25 mg Oral QHS  . sodium chloride flush  3 mL Intravenous Q12H  . tamsulosin  0.4 mg Oral Daily   Continuous Infusions: . sodium chloride       LOS: 4 days    Time spent: 42min    Domenic Polite, MD [facility] Page via [email or web address], password [email or web address] After 7PM please contact night-coverage  04/08/2018, 1:42 PM

## 2018-04-08 NOTE — Progress Notes (Signed)
Pt had broken suprapubic catheter tubing. Catheter team exchanged tubing. Pt tolerated well.

## 2018-04-08 NOTE — Progress Notes (Signed)
[facility] Progress Note  Subjective: stable , no c/o's  Vitals:   04/08/18 0355 04/08/18 0429 04/08/18 0513 04/08/18 0638  BP:  (!) 161/57 (!) 160/48 (!) 161/45  Pulse: (!) 56 64 (!) 57 61  Resp:      Temp: 99.3 F (37.4 C) 98.6 F (37 C) 97.8 F (36.6 C) 98.9 F (37.2 C)  TempSrc: Axillary Axillary Axillary Axillary  SpO2:      Weight:      Height:        Inpatient medications: . sodium chloride   Intravenous Once  . allopurinol  200 mg Oral Daily  . amLODipine  5 mg Oral Daily  . aspirin  325 mg Oral Daily  . calcium carbonate  2 tablet Oral TID WC & HS  . Chlorhexidine Gluconate Cloth  6 each Topical Q0600  . Chlorhexidine Gluconate Cloth  6 each Topical Q0600  . isosorbide mononitrate  15 mg Oral Daily  . metoprolol succinate  25 mg Oral Daily  . mirtazapine  15 mg Oral QHS  . multivitamin  1 tablet Oral Daily  . pantoprazole  40 mg Oral Daily  . PARoxetine  30 mg Oral BH-q7a  . QUEtiapine  25 mg Oral QHS  . sodium chloride flush  3 mL Intravenous Q12H  . tamsulosin  0.4 mg Oral Daily   . sodium chloride     sodium chloride, acetaminophen **OR** acetaminophen, albuterol, gabapentin, hydrALAZINE, LORazepam, ondansetron **OR** ondansetron (ZOFRAN) IV, polyethylene glycol, sodium chloride flush, traMADol, traZODone  Iron/TIBC/Ferritin/ %Sat No results found for: IRON, TIBC, FERRITIN, IRONPCTSAT  Exam: Gen awake and alert No jvd or bruits Chest clear bilat no wheezing or rales RRR no mrg Abd soft ntnd no mass or ascites +bs GU normal male, +SP cath draining yellow urine MS no joint effusions or deformity Ext 1+ L pretib edema Neuro - alert today,  interacting and knows his OP HD sched and current president.     Home meds:  - amlodipine 5/ metoprolol xl 25 qd/ isosorbide mononitrate 60  - allopruinol 200/ omeprazole 20/ tamsulosin 0.4 qd  - quetiapine 25 hs/ paroxetine 30 am/ mirtazapine 15 hs  - gabapentin 300-900 qhs prn  - colace/  vitamins  CXR - vasc congestion, no overt edema  Dialysis: TTS [facility]  3.5h   68kg    Hep 1500 + 300/hr   LFA AVF   - IV vanc 1250mg  q HD thru 10/10 (for UTI dx'd at [facility])   Impression/ Plan:  1. Fall/ R hip fracture - sp ORIF on 04/04/18 2. ESRD  - HD TTS.  HD tomorrow 3. Volume - stable, slightly under dry 1. Dementia - mild, occ agitation 2. HTN - stable BP's on norvasc/ metoprolol 3. Anemia d/t ABL: postop Hb 11.7 >> 7's, sp prbc's on 10/5 4. Debility: return to SNF    Kelly Splinter MD [place] pager [phone]   04/08/2018, 9:33 AM   Recent Labs  Lab 04/04/18 0442  04/06/18 0453 04/07/18 0815  NA 138   < > 138 136  K 4.1   < > 3.9 4.1  CL 97*   < > 96* 97*  CO2 28   < > 28 28  GLUCOSE 112*   < > 67* 149*  BUN 25*   < > 27* 21  CREATININE 6.05*   < > 6.02* 4.08*  CALCIUM 8.2*   < > 8.1* 8.0*  INR 1.14  --   --   --    < > =  values in this interval not displayed.   No results for input(s): AST, ALT, ALKPHOS, BILITOT, PROT in the last 168 hours. Recent Labs  Lab 04/04/18 0442  04/06/18 0859 04/07/18 0815  WBC 12.1*   < > 9.0 7.3  NEUTROABS 9.9*  --   --   --   HGB 11.7*   < > 7.8* 9.2*  HCT 35.7*   < > 23.8* 27.6*  MCV 99.7   < > 99.2 96.2  PLT 199   < > 149* 171   < > = values in this interval not displayed.

## 2018-04-08 NOTE — Progress Notes (Signed)
Soft wrist restraints removed at this time. Pt currently resting in bed. Will re-evaluate as needed.

## 2018-04-08 NOTE — Progress Notes (Addendum)
Physical Therapy Treatment Patient Details Name: [name] [name] MRN: [id number] DOB: 09/21/1934 Today's Date: 04/08/2018    History of Present Illness 83 y.o. male admitted on 04/04/18 for R intertrochanteric femur fx.  Pt underwent cephalomeduallry nailing of R hip on 04/04/18.  Pt is WBAT R leg post op.  Pt with other significant PMH of ESRD, neuropathy, mitral regurgitation, HTN, DM, L ankle fx s/p ORIF, and dementia with behavioral disturbance living at [facility].  Per chart he was WC bound. ABLA post op s/p 1 unit PRBCs.    PT Comments    Pt is more appropriate today, less confused than on evaluation, and more cooperative and participative in my session.  We completed supine and seated HEP as well as standing EOB with the walker.  I tried to get out of [place] if he used a RW or a WC and he stated both, but could not tell me the details.  I envision that he was safer in a WC, but could likely walk assisted short distances with the RW.  He remains appropriate for SNF level rehab.    Follow Up Recommendations  SNF     Equipment Recommendations  None recommended by PT    Recommendations for Other Services   NA     Precautions / Restrictions Precautions Precautions: Fall Required Braces or Orthoses: Other Brace/Splint Other Brace/Splint: suprapubic catheter Restrictions RLE Weight Bearing: Weight bearing as tolerated    Mobility  Bed Mobility Overal bed mobility: Needs Assistance Bed Mobility: Supine to Sit;Sit to Supine     Supine to sit: Mod assist;HOB elevated Sit to supine: Mod assist;HOB elevated   General bed mobility comments: Mod assist to support trunk and transition legs to EOB, assist needed to support legs to return to supine.   Transfers Overall transfer level: Needs assistance Equipment used: Rolling walker (2 wheeled) Transfers: Sit to/from Stand Sit to Stand: Mod assist         General transfer comment: Mod assist to support trunk to stand to  RW.  Pt weight shifted immediately to his left leg and adducted and toe touched his right leg to the floor reporting it hurt to put weight on it.  He stood for ~30 seconds before requesting to sit back down.   Ambulation/Gait             General Gait Details: Would be safer to attempt short distance gait with second person.        Balance Overall balance assessment: Needs assistance Sitting-balance support: Feet supported;Bilateral upper extremity supported Sitting balance-Leahy Scale: Fair Sitting balance - Comments: supervision EOB, slight posterior preference especially when preforming seated leg exercises.  Postural control: Posterior lean Standing balance support: Bilateral upper extremity supported Standing balance-Leahy Scale: Poor Standing balance comment: needs external assist and RW.                             Cognition Arousal/Alertness: Awake/alert Behavior During Therapy: Impulsive Overall Cognitive Status: History of cognitive impairments - at baseline                                 General Comments: Pt more appropriate today, confused, but less than intial evaluation.  He is starting to [employer] some familiar faces (his RN) and that she went to get him something (coke).  He can localize that his right leg  hurts and he cannot stand on it (he has to stand more on his left leg).  He doesn't know he is in the hospital when asked, but has some vague recollection that he has been recently.        Exercises Total Joint Exercises Ankle Circles/Pumps: AAROM;Right;10 reps Heel Slides: AAROM;Right;10 reps Hip ABduction/ADduction: AAROM;Right;10 reps Long Arc Quad: AROM;AAROM;Both;10 reps(x 2 sets each, one AROM, one AAROM)        Pertinent Vitals/Pain Pain Assessment: Faces Faces Pain Scale: Hurts even more Pain Location: right leg Pain Descriptors / Indicators: Guarding;Grimacing Pain Intervention(s): Limited activity within patient's  tolerance;Monitored during session;Repositioned           PT Goals (current goals can now be found in the care plan section) Acute Rehab PT Goals Patient Stated Goal: none stated Progress towards PT goals: Progressing toward goals    Frequency    Min 3X/week      PT Plan Current plan remains appropriate       AM-PAC PT "6 Clicks" Daily Activity  Outcome Measure  Difficulty turning over in bed (including adjusting bedclothes, sheets and blankets)?: Unable Difficulty moving from lying on back to sitting on the side of the bed? : Unable Difficulty sitting down on and standing up from a chair with arms (e.g., wheelchair, bedside commode, etc,.)?: Unable Help needed moving to and from a bed to chair (including a wheelchair)?: A Lot Help needed walking in hospital room?: Total Help needed climbing 3-5 steps with a railing? : Total 6 Click Score: 7    End of Session   Activity Tolerance: Patient limited by pain Patient left: in bed;with call bell/phone within reach;with bed alarm set Nurse Communication: Mobility status PT Visit Diagnosis: Muscle weakness (generalized) (M62.81);Difficulty in walking, not elsewhere classified (R26.2);Pain Pain - Right/Left: Right Pain - part of body: Hip     Time: 0315-9458 PT Time Calculation (min) (ACUTE ONLY): 22 min  Charges:  $Therapeutic Activity: 8-22 mins                    Shailen Thielen B. Crixus Mcaulay, PT, DPT  Acute Rehabilitation [phone] pager #[phone] office   04/08/2018, 4:21 PM

## 2018-04-09 ENCOUNTER — Encounter (HOSPITAL_COMMUNITY): Payer: Self-pay | Admitting: Student

## 2018-04-09 LAB — CBC
HCT: 26.9 % — ABNORMAL LOW (ref 39.0–52.0)
Hemoglobin: 8.9 g/dL — ABNORMAL LOW (ref 13.0–17.0)
MCH: 32.7 pg (ref 26.0–34.0)
MCHC: 33.1 g/dL (ref 30.0–36.0)
MCV: 98.9 fL (ref 80.0–100.0)
Platelets: 191 10*3/uL (ref 150–400)
RBC: 2.72 MIL/uL — ABNORMAL LOW (ref 4.22–5.81)
RDW: 14.2 % (ref 11.5–15.5)
WBC: 7.1 10*3/uL (ref 4.0–10.5)
nRBC: 0 % (ref 0.0–0.2)

## 2018-04-09 LAB — RENAL FUNCTION PANEL
Albumin: 2.6 g/dL — ABNORMAL LOW (ref 3.5–5.0)
Anion gap: 16 — ABNORMAL HIGH (ref 5–15)
BUN: 50 mg/dL — AB (ref 8–23)
CALCIUM: 8.4 mg/dL — AB (ref 8.9–10.3)
CO2: 24 mmol/L (ref 22–32)
Chloride: 96 mmol/L — ABNORMAL LOW (ref 98–111)
Creatinine, Ser: 6.53 mg/dL — ABNORMAL HIGH (ref 0.61–1.24)
GFR calc Af Amer: 8 mL/min — ABNORMAL LOW (ref >60)
GFR calc non Af Amer: 7 mL/min — ABNORMAL LOW (ref >60)
GLUCOSE: 83 mg/dL (ref 70–99)
PHOSPHORUS: 3.2 mg/dL (ref 2.5–4.6)
Potassium: 4.9 mmol/L (ref 3.5–5.1)
SODIUM: 136 mmol/L (ref 135–145)

## 2018-04-09 MED ORDER — ACETAMINOPHEN 325 MG PO TABS
ORAL_TABLET | ORAL | Status: AC
  Filled 2018-04-09: qty 2

## 2018-04-09 NOTE — Discharge Summary (Signed)
Physician Discharge Summary  COMPTON BRIGANCE IRC:[id number] DOB: [birth year] DOA: 04/04/2018  PCP: Deloria Lair., MD  Admit date: 04/04/2018 Discharge date: 04/09/2018  Time spent: 45 minutes  Recommendations for Outpatient Follow-up:  1. PCP in 1 week 2. Ortho Dr.Haddix in 2weeks 3. Palliative care FU at SNF recommended   Discharge Diagnoses:    R hip fracture   Advanced Dementia   Urinary retention, with chronic suprapubic catheter   DM (diabetes mellitus) ([place])   ESRD on dialysis ([place])   Anemia in CKD (chronic kidney disease)   Hip fracture ([place])   Delirium  Discharge Condition: stable  Diet recommendation: Renal diet  Filed Weights   04/04/18 2203 04/06/18 1200 04/09/18 0802  Weight: 68.4 kg 66.5 kg 66.3 kg    History of present illness:  [name] [name] is an 83 year old male with history of dementia, ESRD on hemodialysis Tuesday Thursday Saturday, resident of [facility], chronic suprapubic cath, wheelchair-bound at baseline presented to the ER after mechanical fall with right comminuted intertrochanteric femur fracture.  Hospital Course:   -Right hip intertrochanteric femur fracture  -Orthopedics Dr. Wilber Bihari, underwent  ORIF 10/3 -continue peri op BB -ASA for DVT proph recommended by ortho -PT/OT eval completed, DC back to SNF recommended -Weight bearing as tolerated of RLE -FU with Dr.Haddix in 10-14days  Acute blood loss anemia -Transfused 1 unit PRBC -hb stable post op -gets EPo for anemia of chronic disease  End-stage renal disease on hemodialysis Tuesday Thursday Saturday -s/p hemodialysis yesterday -Volume status is stable  Advanced dementia with behavioral disturbance -Continue Seroquel and Remeron nightly -needs Palliative FU at SNF  Hypertension -Uncontrolled, continue imdur, amlodipine,  Toprol-XL  Chronic Suprapubic catheter -at baseline, Pt try to pull this few days ago, this caused a break in the catheter -Suprapubic  catheter was exchanged 10/7, now working well -FU with Urology, continue routine cath care  Social/Ethics---patient is from [facility] home, is a DNR/DNI  Code Status: DNR  Discharge Exam: Vitals:   04/09/18 1030 04/09/18 1100  BP: 125/69 (!) 155/75  Pulse: 63 69  Resp:    Temp:    SpO2:      General: AAOx to self only, pleasantly confused Cardiovascular: S1S2/RRR Respiratory: CTAB  Discharge Instructions   Discharge Instructions    Diet - low sodium heart healthy   Complete by:  As directed    Discharge instructions   Complete by:  As directed    Renal Diet   Increase activity slowly   Complete by:  As directed    Increase activity slowly   Complete by:  As directed      Allergies as of 04/09/2018      Reactions   Doxycycline    Ivp Dye [iodinated Diagnostic Agents]    Unknown   Ofloxacin Nausea And Vomiting   Oxycodone    Altered mental status   Sulfa Antibiotics Rash   Sulfamethoxazole-trimethoprim Rash      Medication List    STOP taking these medications   hydrOXYzine 25 MG tablet Commonly known as:  ATARAX/VISTARIL   mirtazapine 15 MG tablet Commonly known as:  REMERON     TAKE these medications   acetaminophen 325 MG tablet Commonly known as:  TYLENOL Take 325 mg by mouth every 6 (six) hours as needed for mild pain or moderate pain.   allopurinol 100 MG tablet Commonly known as:  ZYLOPRIM Take 100 mg by mouth 2 (two) times daily.   amLODipine 5 MG tablet Commonly  known as:  NORVASC Take 1 tablet (5 mg total) by mouth daily. What changed:  how much to take   aspirin 325 MG tablet Take 1 tablet (325 mg total) by mouth daily.   gabapentin 100 MG capsule Commonly known as:  NEURONTIN Take 1 capsule (100 mg total) by mouth at bedtime. What changed:    medication strength  how much to take  when to take this  reasons to take this   isosorbide mononitrate 30 MG 24 hr tablet Commonly known as:  IMDUR Take 1 tablet  (30 mg total) by mouth daily. What changed:    medication strength  how much to take   Melatonin 3 MG Tabs Take 6 mg by mouth at bedtime as needed for sleep.   metoprolol succinate 25 MG 24 hr tablet Commonly known as:  TOPROL-XL Take 1 tablet (25 mg total) by mouth daily.   omeprazole 20 MG capsule Commonly known as:  PRILOSEC Take 20 mg by mouth daily.   PARoxetine 10 MG tablet Commonly known as:  PAXIL Take 1 tablet (10 mg total) by mouth every morning. What changed:    medication strength  how much to take   polyethylene glycol packet Commonly known as:  MIRALAX / GLYCOLAX Take 17 g by mouth daily as needed for constipation.   QUEtiapine 25 MG tablet Commonly known as:  SEROQUEL Take 25 mg by mouth at bedtime.   senna 8.6 MG tablet Commonly known as:  SENOKOT Take 2 tablets by mouth daily as needed for constipation.   tamsulosin 0.4 MG Caps capsule Commonly known as:  FLOMAX Take 1 capsule (0.4 mg total) by mouth daily.   traZODone 50 MG tablet Commonly known as:  DESYREL Take 0.5 mg by mouth at bedtime. (if melatonin given and >30 mins still not sleep      Allergies  Allergen Reactions  . Doxycycline   . Ivp Dye [Iodinated Diagnostic Agents]     Unknown  . Ofloxacin Nausea And Vomiting  . Oxycodone     Altered mental status  . Sulfa Antibiotics Rash  . Sulfamethoxazole-Trimethoprim Rash   Follow-up Information    Deloria Lair., MD. Schedule an appointment as soon as possible for a visit in 1 week(s).   Specialty:  Family Medicine Contact information: [address] [place] [address] [phone]        Shona Needles, MD. Schedule an appointment as soon as possible for a visit in 10 day(s).   Specialty:  Orthopedic Surgery Contact information: [address] [place] [address] [phone]            The results of significant diagnostics from this hospitalization (including imaging, microbiology, ancillary  and laboratory) are listed below for reference.    Significant Diagnostic Studies: Dg Pelvis 1-2 Views  Result Date: 04/04/2018 CLINICAL DATA:  83 y/o M; fall from wheelchair with right hip and upper leg pain. EXAM: RIGHT FEMUR 2 VIEWS; PELVIS - 1-2 VIEW COMPARISON:  None. FINDINGS: Right femur: Acute comminuted intratrochanteric fracture of the right femur with mild displacement of fracture components and coxa vara angulation. The femoral head is well seated within the acetabulum. No additional fracture of the femur identified. The knee joint is grossly maintained. Vascular calcifications noted. Pelvis: No acute pelvic fracture or diastasis. Hip joints are well maintained. Degenerative changes of the lower lumbar spine. Vascular calcifications noted. IMPRESSION: Acute comminuted intratrochanteric fracture of the right femur with mild displacement of  fracture components and coxa vara angulation. Electronically Signed   By: Kristine Garbe M.D.   On: 04/04/2018 04:23   Dg Pelvis Portable  Result Date: 04/04/2018 CLINICAL DATA:  ORIF RIGHT femur fracture. EXAM: PORTABLE PELVIS 1-2 VIEWS COMPARISON:  04/04/2018 FINDINGS: Intramedullary nail/screw within the proximal RIGHT femur traverses an intertrochanteric femur fracture with 5 mm LATERAL displacement. No dislocation. IMPRESSION: ORIF RIGHT femur fracture. Electronically Signed   By: Margarette Canada M.D.   On: 04/04/2018 15:42   Dg Chest Port 1 View  Result Date: 04/04/2018 CLINICAL DATA:  Preop.  Fall out of wheelchair with hip fracture. EXAM: PORTABLE CHEST 1 VIEW COMPARISON:  Radiograph 01/03/2018 FINDINGS: Cardiomegaly is unchanged. Minimal vascular congestion without pulmonary edema in a. small right pleural effusion on thoracic spine CT not well seen radiographically. No focal airspace disease or pneumothorax. No acute osseous abnormalities are seen. IMPRESSION: Cardiomegaly with vascular congestion.  No pulmonary edema. Electronically Signed    By: Keith Rake M.D.   On: 04/04/2018 04:50   Dg C-arm 1-60 Min  Result Date: 04/04/2018 CLINICAL DATA:  Fluoro spot series demonstrating ORIF for an intertrochanteric fracture. EXAM: OPERATIVE right HIP (WITH PELVIS IF PERFORMED) 5 VIEWS TECHNIQUE: Fluoroscopic spot image(s) were submitted for interpretation post-operatively. COMPARISON:  AP view of the pelvis of April 04, 2018 FINDINGS: The patient has a known mildly displaced intertrochanteric fracture of the right hip. The fluoro spot images reveal ORIF with placement of Zickel nail. The concluding images reveal alignment to be near anatomic. IMPRESSION: No immediate complication following ORIF for an intertrochanteric fracture of the right hip. Electronically Signed   By: David  Martinique M.D.   On: 04/04/2018 14:46   Dg Hip Operative Unilat W Or W/o Pelvis Right  Result Date: 04/04/2018 CLINICAL DATA:  Fluoro spot series demonstrating ORIF for an intertrochanteric fracture. EXAM: OPERATIVE right HIP (WITH PELVIS IF PERFORMED) 5 VIEWS TECHNIQUE: Fluoroscopic spot image(s) were submitted for interpretation post-operatively. COMPARISON:  AP view of the pelvis of April 04, 2018 FINDINGS: The patient has a known mildly displaced intertrochanteric fracture of the right hip. The fluoro spot images reveal ORIF with placement of Zickel nail. The concluding images reveal alignment to be near anatomic. IMPRESSION: No immediate complication following ORIF for an intertrochanteric fracture of the right hip. Electronically Signed   By: David  Martinique M.D.   On: 04/04/2018 14:46   Dg Femur Min 2 Views Right  Result Date: 04/04/2018 CLINICAL DATA:  83 y/o M; fall from wheelchair with right hip and upper leg pain. EXAM: RIGHT FEMUR 2 VIEWS; PELVIS - 1-2 VIEW COMPARISON:  None. FINDINGS: Right femur: Acute comminuted intratrochanteric fracture of the right femur with mild displacement of fracture components and coxa vara angulation. The femoral head is well  seated within the acetabulum. No additional fracture of the femur identified. The knee joint is grossly maintained. Vascular calcifications noted. Pelvis: No acute pelvic fracture or diastasis. Hip joints are well maintained. Degenerative changes of the lower lumbar spine. Vascular calcifications noted. IMPRESSION: Acute comminuted intratrochanteric fracture of the right femur with mild displacement of fracture components and coxa vara angulation. Electronically Signed   By: Kristine Garbe M.D.   On: 04/04/2018 04:23    Microbiology: Recent Results (from the past 240 hour(s))  MRSA PCR Screening     Status: None   Collection Time: 04/05/18  2:27 PM  Result Value Ref Range Status   MRSA by PCR NEGATIVE NEGATIVE Final    Comment:  The GeneXpert MRSA Assay (FDA approved for NASAL specimens only), is one component of a comprehensive MRSA colonization surveillance program. It is not intended to diagnose MRSA infection nor to guide or monitor treatment for MRSA infections. Performed at [facility], [place] [address] [place], [place] [address]      Labs: Basic Metabolic Panel: Recent Labs  Lab 04/04/18 [phone] 04/05/18 0647 04/06/18 0453 04/07/18 0815 04/09/18 0914  NA 138 139 138 136 136  K 4.1 4.0 3.9 4.1 4.9  CL 97* 96* 96* 97* 96*  CO2 28 30 28 28 24   GLUCOSE 112* 96 67* 149* 83  BUN 25* 16 27* 21 50*  CREATININE 6.05* 4.61* 6.02* 4.08* 6.53*  CALCIUM 8.2* 7.8* 8.1* 8.0* 8.4*  PHOS  --   --   --   --  3.2   Liver Function Tests: Recent Labs  Lab 04/09/18 0914  ALBUMIN 2.6*   No results for input(s): LIPASE, AMYLASE in the last 168 hours. No results for input(s): AMMONIA in the last 168 hours. CBC: Recent Labs  Lab 04/04/18 0442 04/05/18 0647 04/06/18 0453 04/06/18 0859 04/07/18 0815 04/09/18 0914  WBC 12.1* 10.8* 8.4 9.0 7.3 7.1  NEUTROABS 9.9*  --   --   --   --   --   HGB 11.7* 8.9* 7.6* 7.8* 9.2* 8.9*  HCT 35.7* 27.1* 23.0* 23.8* 27.6*  26.9*  MCV 99.7 99.3 99.1 99.2 96.2 98.9  PLT 199 143* 140* 149* 171 191   Cardiac Enzymes: No results for input(s): CKTOTAL, CKMB, CKMBINDEX, TROPONINI in the last 168 hours. BNP: BNP (last 3 results) No results for input(s): BNP in the last 8760 hours.  ProBNP (last 3 results) No results for input(s): PROBNP in the last 8760 hours.  CBG: Recent Labs  Lab 04/04/18 1523  GLUCAP 116*       Signed:  Domenic Polite MD.  [facility] 04/09/2018, 11:34 AM

## 2018-04-09 NOTE — Care Management Important Message (Signed)
Important Message  Patient Details  Name: [name] [name] MRN: [id number] Date of Birth: 1934/12/12   Medicare Important Message Given:  Yes    [name] 04/09/2018, 2:45 PM

## 2018-04-09 NOTE — Procedures (Signed)
Pt seen on HD.  No distress, calm today.  Still awaiting SNF placement.    I was present at this dialysis session, have reviewed the session itself and made  appropriate changes Kelly Splinter MD [place] pager [phone]   04/09/2018, 10:38 AM

## 2018-04-09 NOTE — Progress Notes (Signed)
CSW spoke with Larene Beach at [facility] who reports she has not heard back from [facility] and has reached out several times. Larene Beach reports she will be continuing to call and follow up and she had anticipated hearing from them soon to get patient discharged and back at [facility] today.   CSW will continue to follow up.   [place], [place]

## 2018-04-09 NOTE — Progress Notes (Addendum)
Patient will DC to: [facility] Anticipated DC date: 04/09/18 Family notified: Left vm for [facility] Transport by: Corey Harold next available   Per MD patient ready for DC to [facility]. RN, patient, patient's family, and facility notified of DC. Discharge Summary and FL2 sent to facility. RN to call report prior to discharge [phone], Speak to 300 hall nurse). DC packet on chart. Ambulance transport requested for patient.   CSW will sign off for now as social work intervention is no longer needed. Please consult [place] again if new needs arise.  Cedric Fishman, LCSW Clinical Social Worker [phone]

## 2018-04-09 NOTE — Progress Notes (Signed)
Discharge instructions (including medications) discussed with and copy provided to transport to give to nurse at [facility]. All belongings sent with patient.

## 2018-04-09 NOTE — Progress Notes (Signed)
Report called to Arbie Cookey, nurse at [facility].

## 2018-05-23 ENCOUNTER — Other Ambulatory Visit (HOSPITAL_COMMUNITY)
Admission: RE | Admit: 2018-05-23 | Discharge: 2018-05-23 | Disposition: A | Discharge status: Home / Self Care | Payer: Medicare PPO | Source: Other Acute Inpatient Hospital | Attending: Nephrology | Admitting: Nephrology

## 2018-05-23 DIAGNOSIS — N186 End stage renal disease: Secondary | ICD-10-CM | POA: Diagnosis present

## 2018-05-23 LAB — PHOSPHORUS: Phosphorus: 4.5 mg/dL (ref 2.5–4.6)

## 2018-05-23 LAB — POTASSIUM: Potassium: 4.2 mmol/L (ref 3.5–5.1)

## 2018-11-01 DEATH — deceased

## 2019-11-06 IMAGING — DX DG PORTABLE PELVIS
1 series · 1 of 1 positions shown · non-contrast
Comparison: 04/04/2018

CLINICAL DATA: ORIF RIGHT femur fracture.

EXAM:
PORTABLE PELVIS 1-2 VIEWS

[pelvis ap]
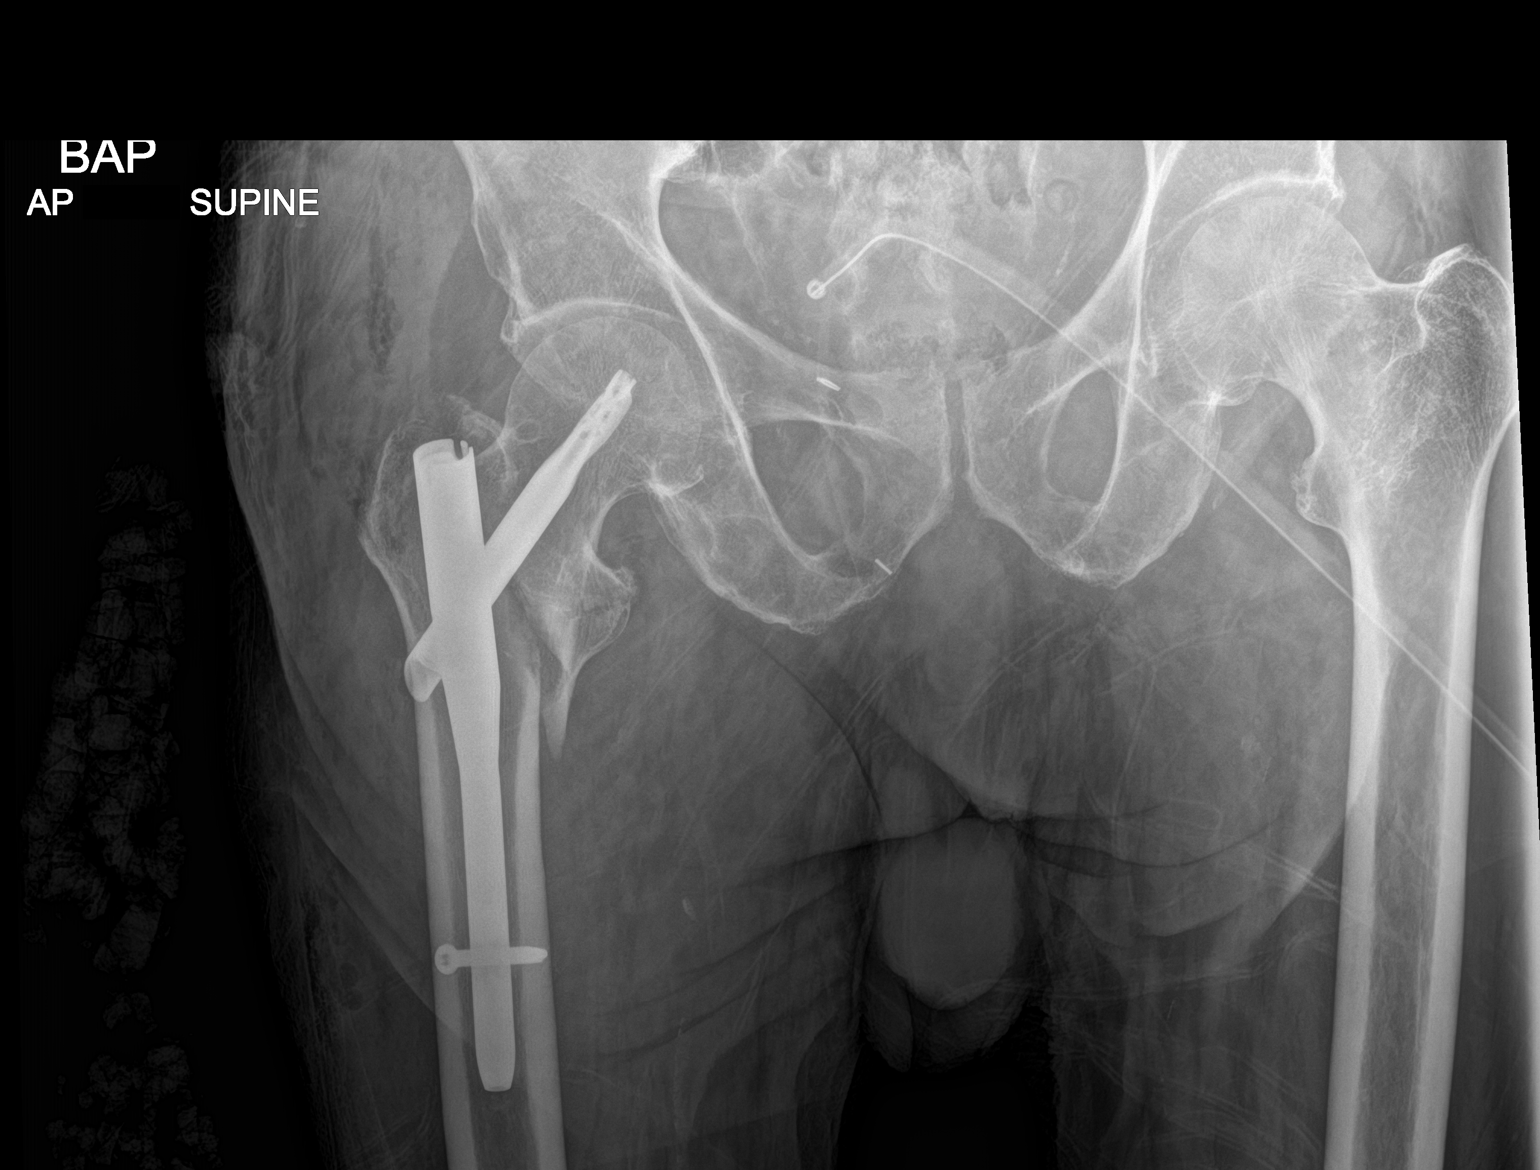

[1 of 1 positions shown; findings below may reference images not displayed]

FINDINGS: Intramedullary nail/screw within the proximal RIGHT femur traverses
an intertrochanteric femur fracture with 5 mm LATERAL displacement.

No dislocation.
IMPRESSION: ORIF RIGHT femur fracture.

## 2019-11-06 IMAGING — RF DG C-ARM 61-120 MIN
1 series · 5 of 5 positions shown · non-contrast
Comparison: AP view of the pelvis of April 04, 2018

CLINICAL DATA: Fluoro spot series demonstrating ORIF for an
intertrochanteric fracture.

EXAM:
OPERATIVE right HIP (WITH PELVIS IF PERFORMED) 5 VIEWS
TECHNIQUE: Fluoroscopic spot image(s) were submitted for interpretation
post-operatively.

[Series 1: run · 5 of 5 slices shown]
[im 1/5]
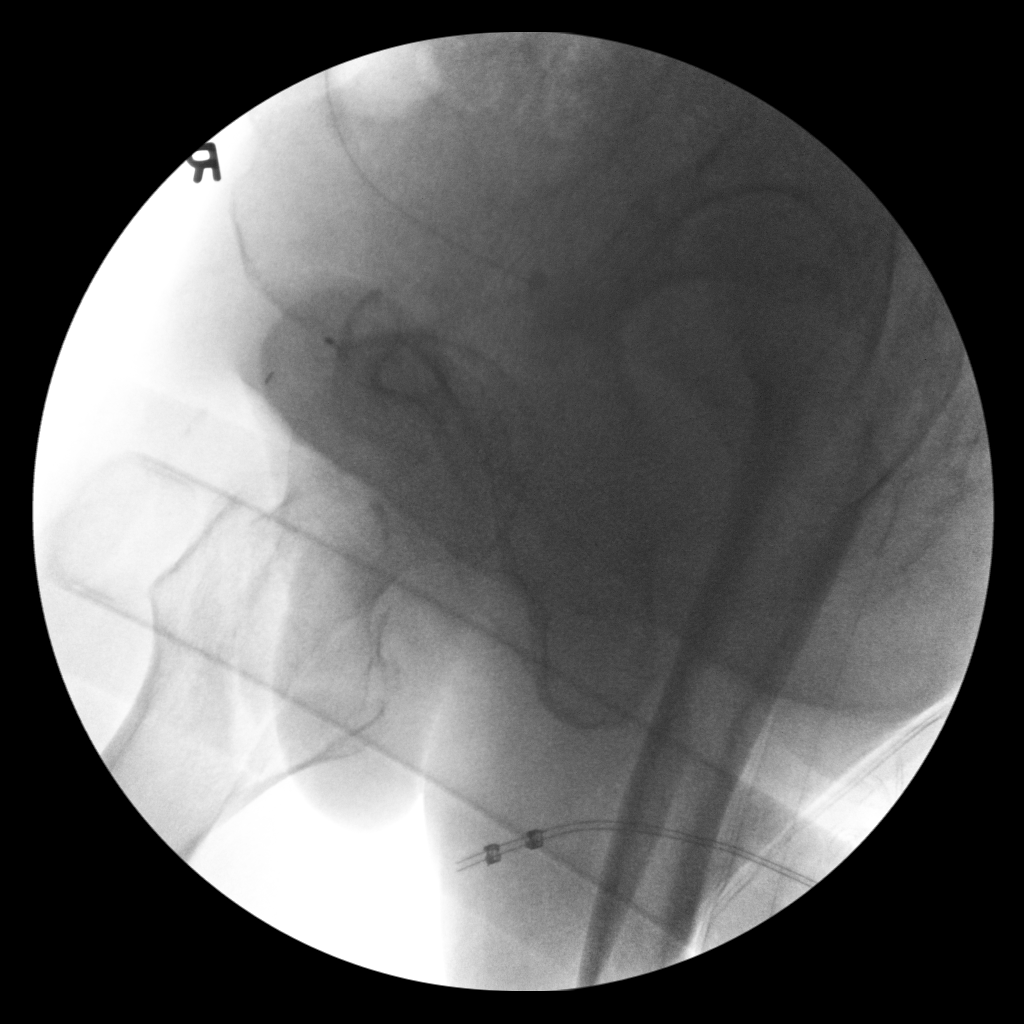
[im 2/5]
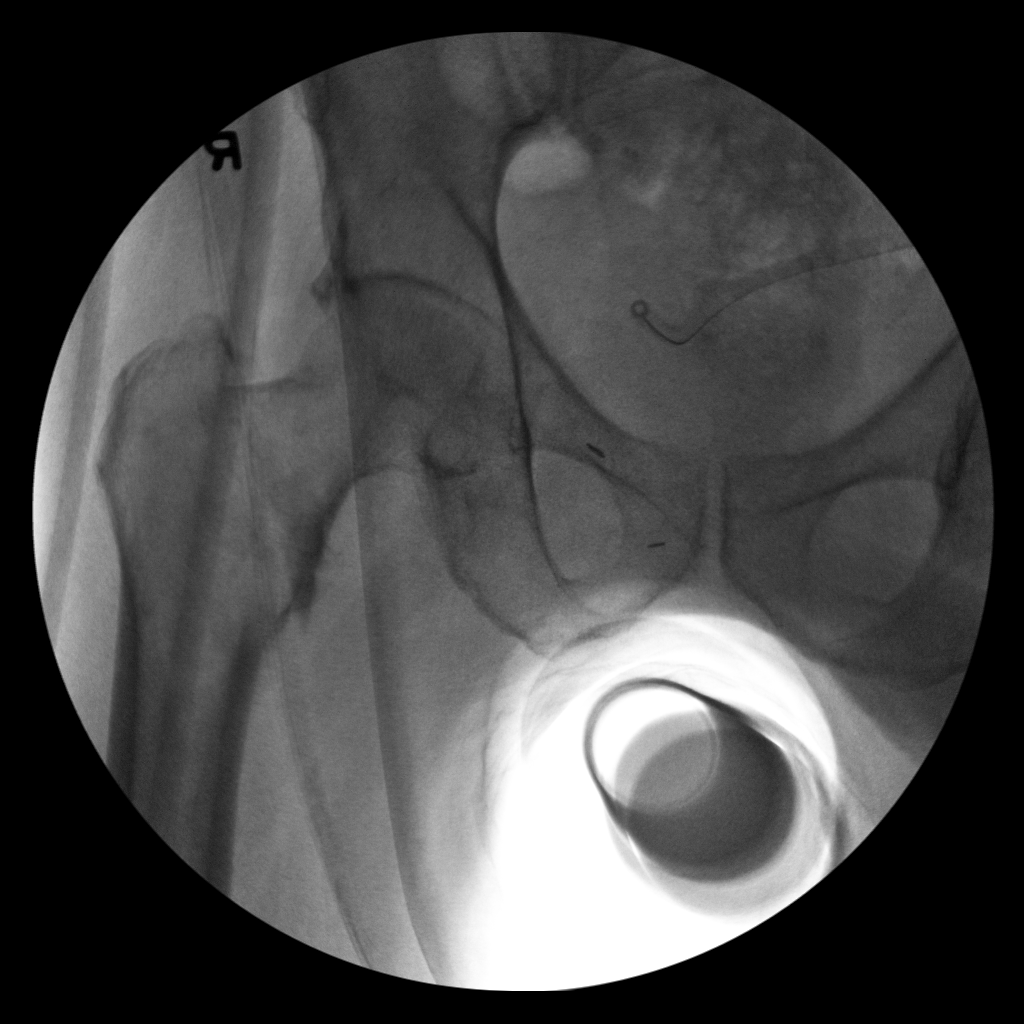
[im 3/5]
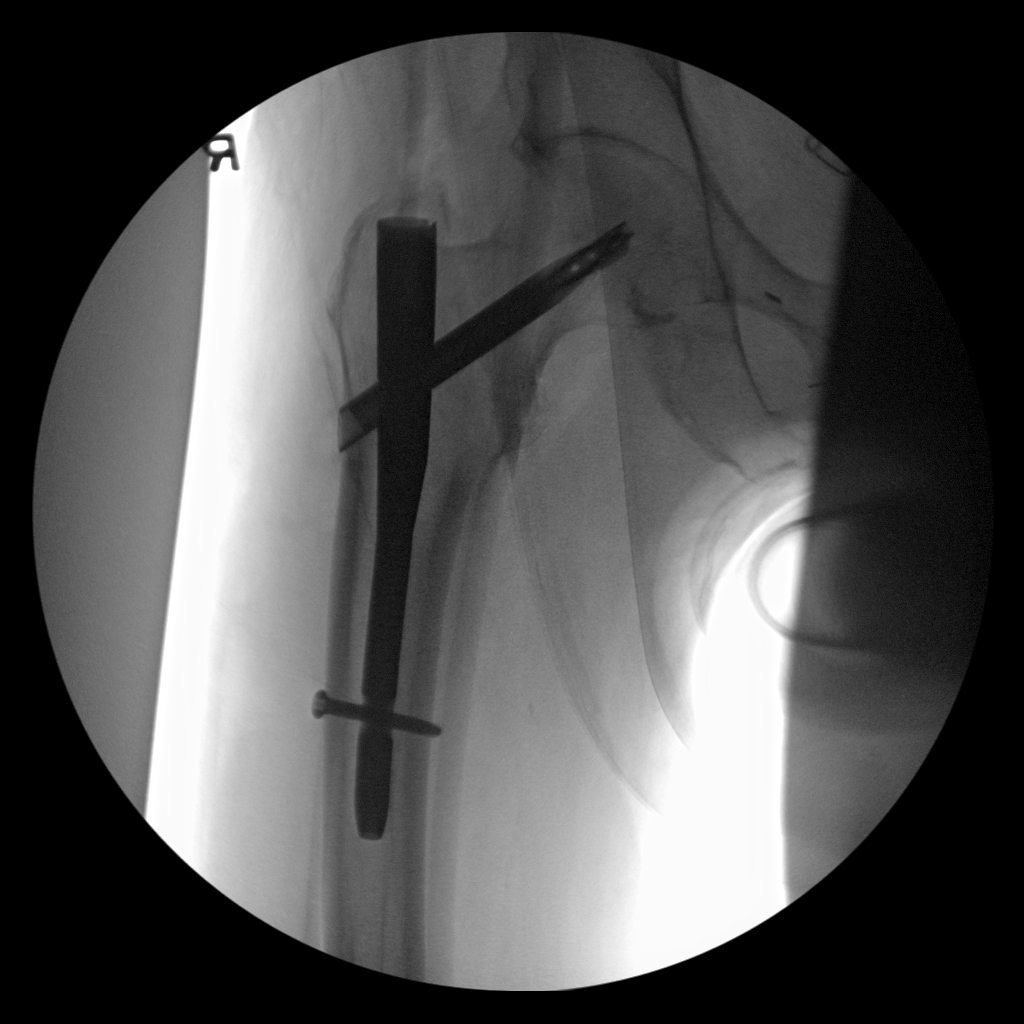
[im 4/5]
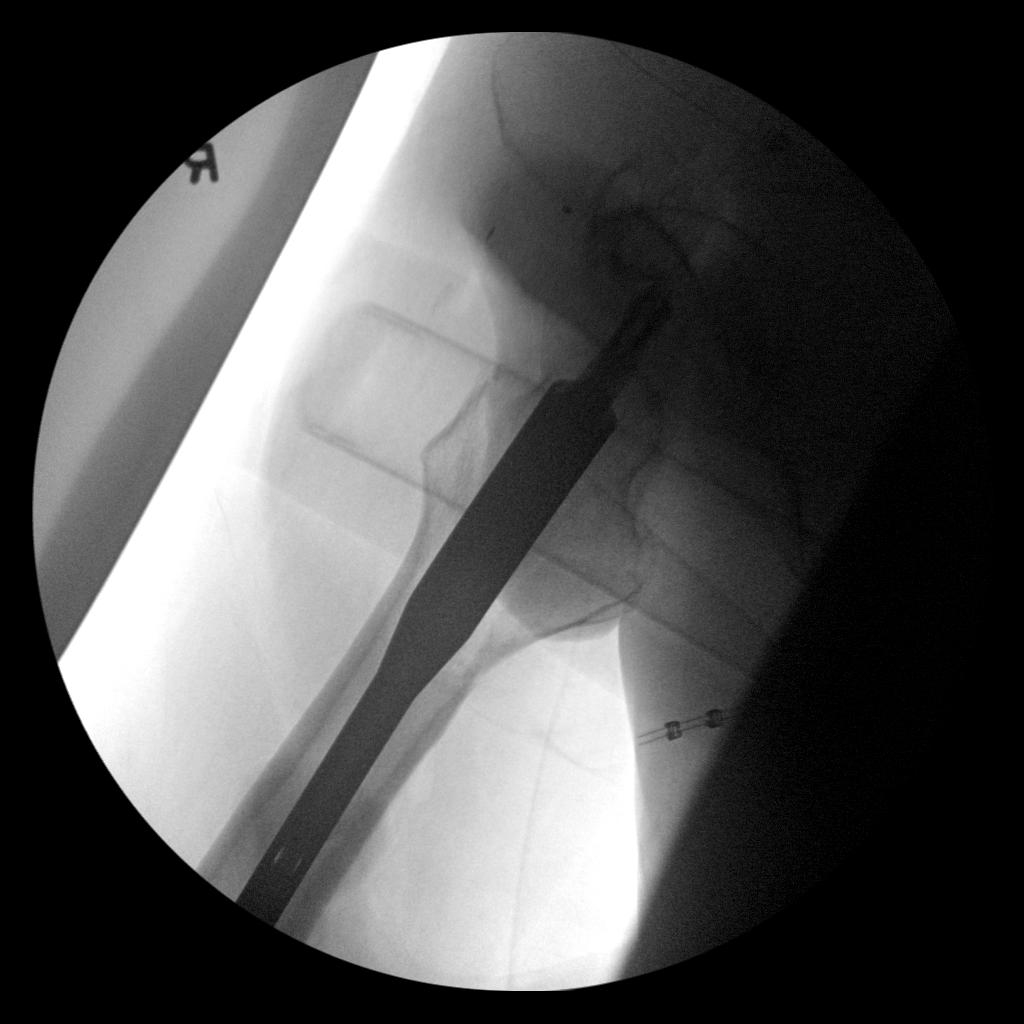
[im 5/5]
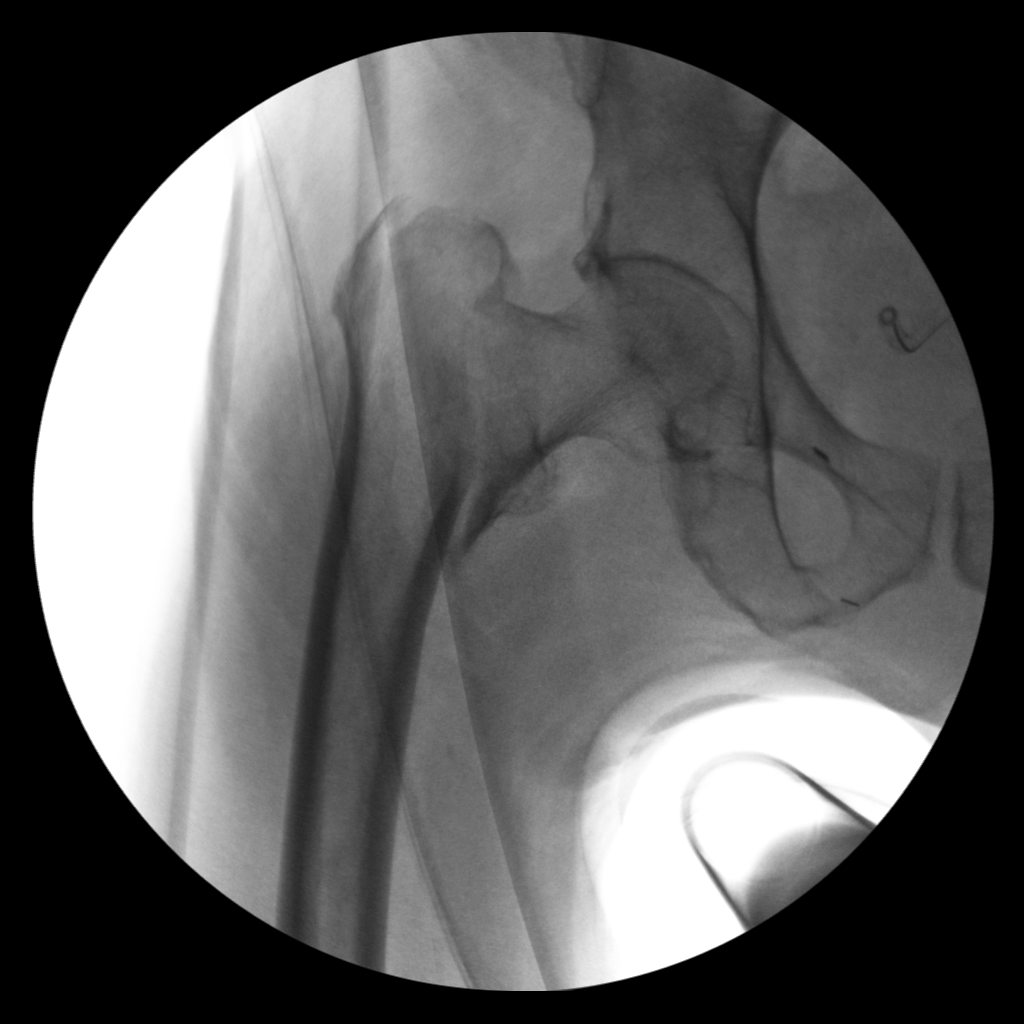

[5 of 5 positions shown; findings below may reference images not displayed]

FINDINGS: The patient has a known mildly displaced intertrochanteric fracture
of the right hip. The fluoro spot images reveal ORIF with placement
of Danish Corsi. The concluding images reveal alignment to be near
anatomic.
IMPRESSION: No immediate complication following ORIF for an intertrochanteric
fracture of the right hip.

## 2019-11-06 IMAGING — DX DG FEMUR 2+V*R*
5 series · 5 of 5 positions shown · non-contrast
Comparison: None.

CLINICAL DATA: 83 y/o M; fall from wheelchair with right hip and
upper leg pain.

EXAM:
RIGHT FEMUR 2 VIEWS; PELVIS - 1-2 VIEW

[femur ap (1 of 2)]
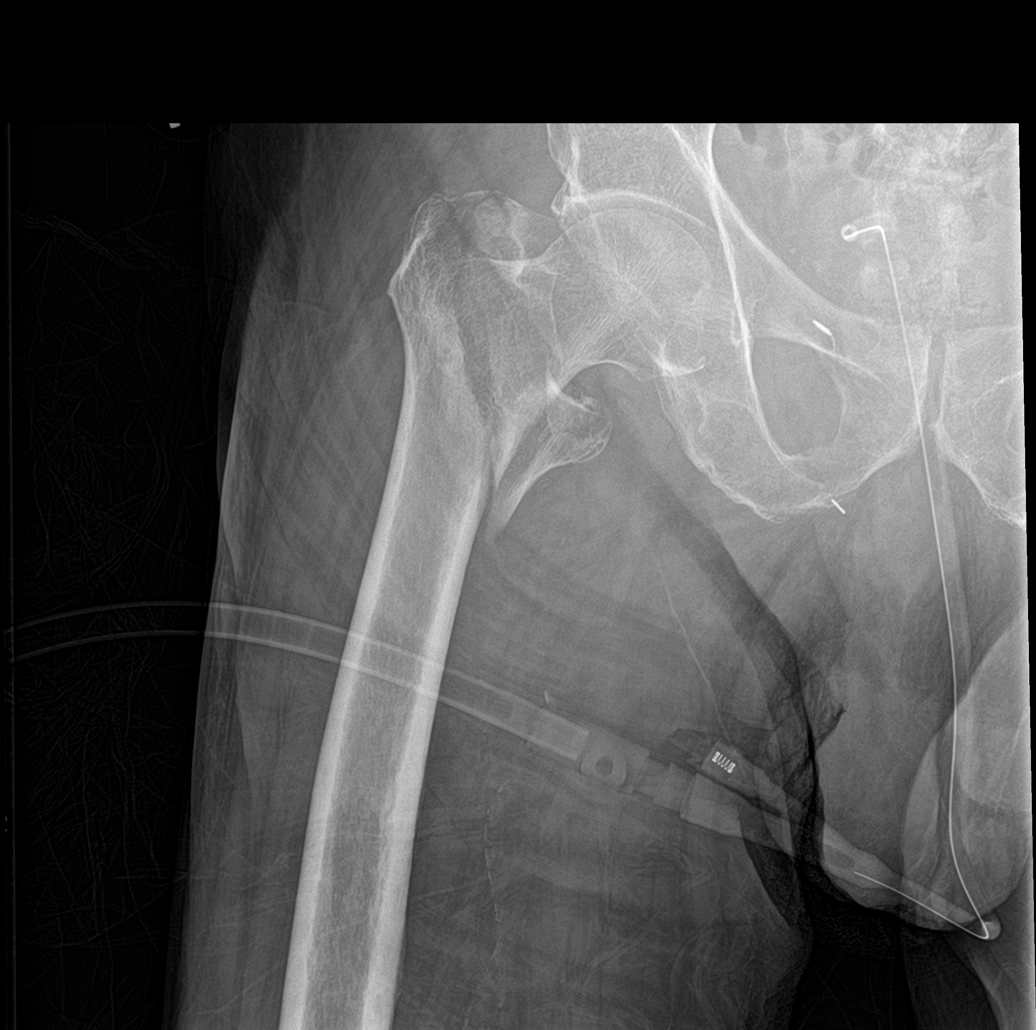

[femur ap (2 of 2)]
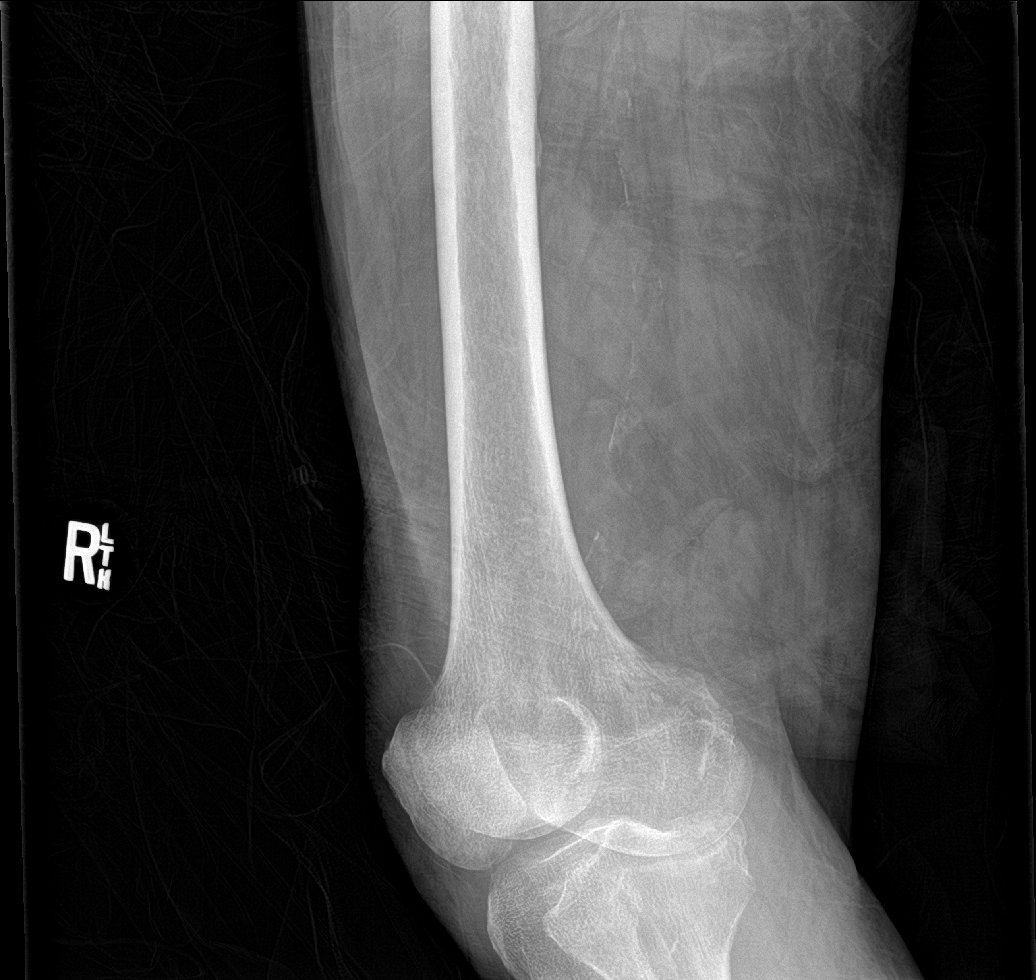

[femur lat (1 of 3)]
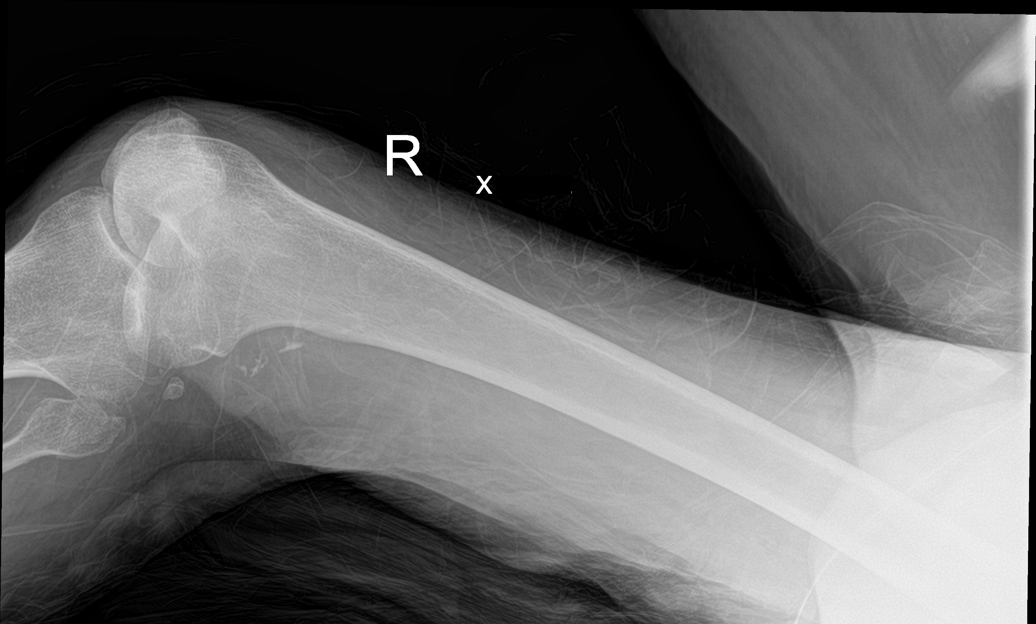

[femur lat (2 of 3)]
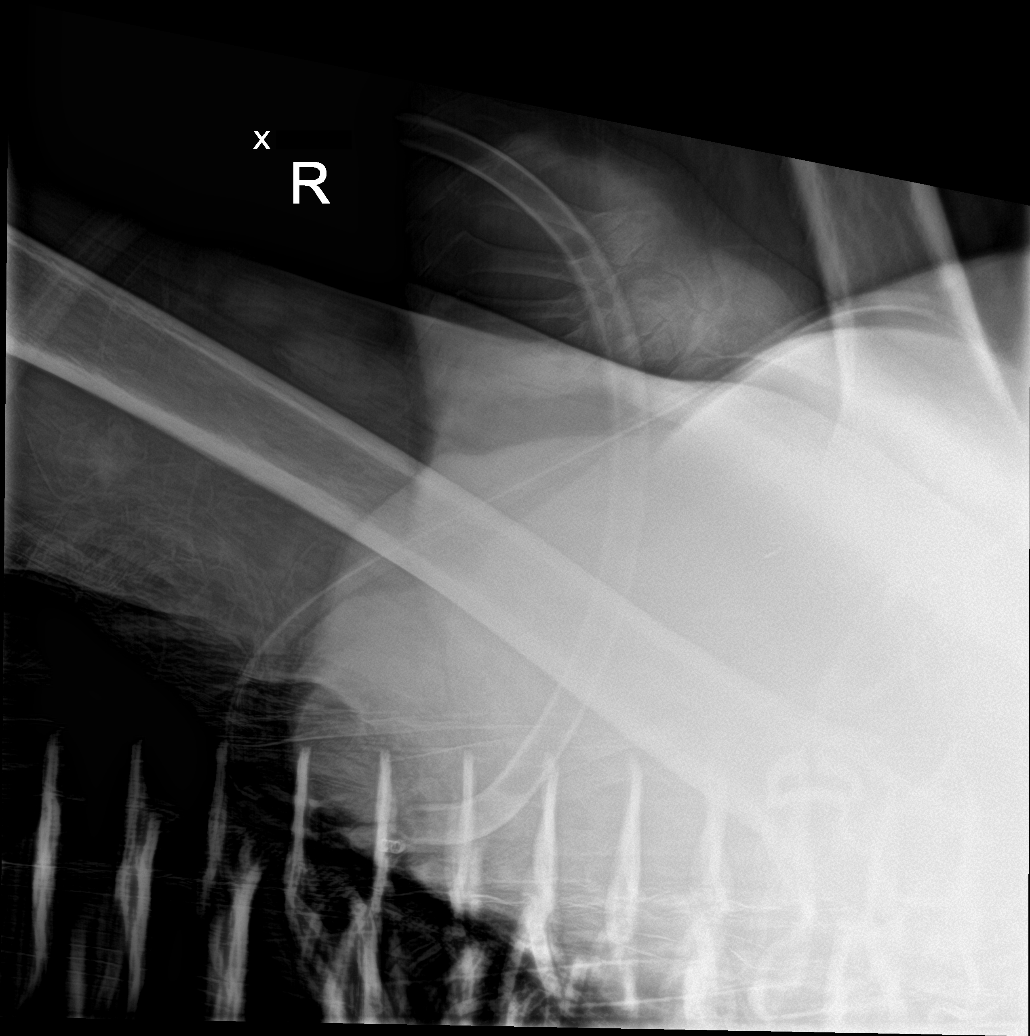

[femur lat (3 of 3)]
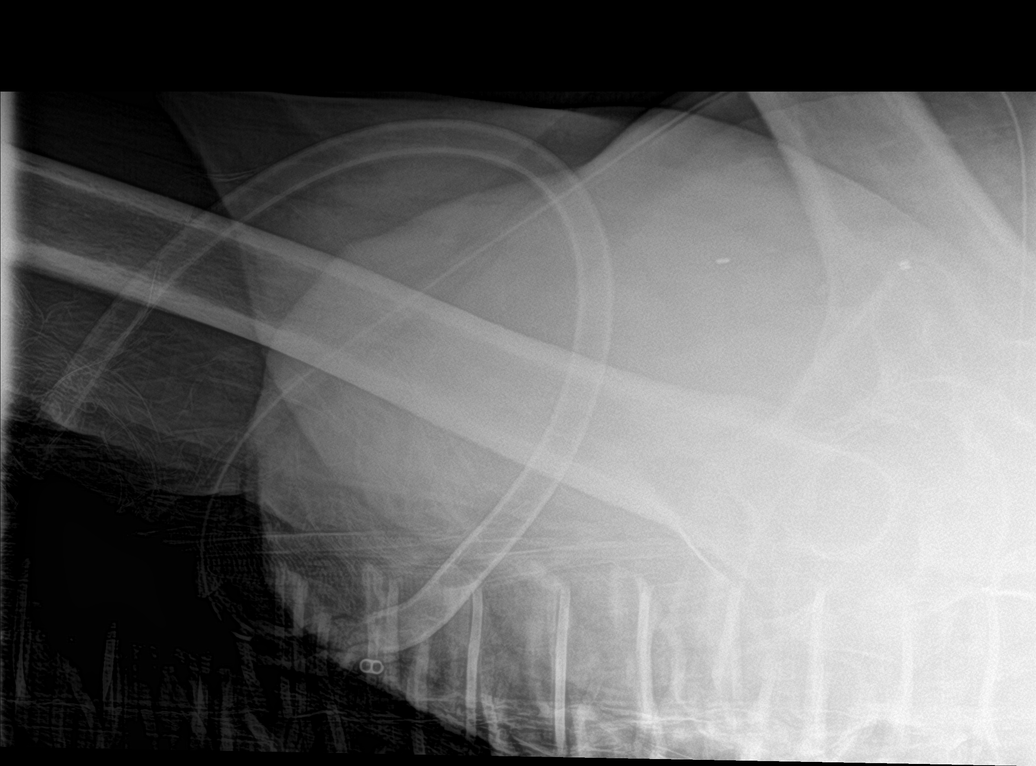

[5 of 5 positions shown; findings below may reference images not displayed]

FINDINGS: Right femur:

Acute comminuted intratrochanteric fracture of the right femur with
mild displacement of fracture components and coxa vara angulation.
The femoral head is well seated within the acetabulum. No additional
fracture of the femur identified. The knee joint is grossly
maintained. Vascular calcifications noted.

Pelvis:

No acute pelvic fracture or diastasis. Hip joints are well
maintained. Degenerative changes of the lower lumbar spine. Vascular
calcifications noted.
IMPRESSION: Acute comminuted intratrochanteric fracture of the right femur with
mild displacement of fracture components and coxa vara angulation.

By: Dewayne De Avila M.D.
